# Patient Record
Sex: Female | Born: 1935 | Race: White | Hispanic: No | Marital: Married | Smoking: Never smoker
Health system: Southern US, Community
[De-identification: ages and names within clinical notes are randomized; demographics above are authoritative.]

## PROBLEM LIST (undated history)

## (undated) DIAGNOSIS — F32A Depression, unspecified: Secondary | ICD-10-CM

## (undated) DIAGNOSIS — J449 Chronic obstructive pulmonary disease, unspecified: Secondary | ICD-10-CM

## (undated) DIAGNOSIS — I10 Essential (primary) hypertension: Secondary | ICD-10-CM

## (undated) DIAGNOSIS — G629 Polyneuropathy, unspecified: Secondary | ICD-10-CM

## (undated) DIAGNOSIS — M1990 Unspecified osteoarthritis, unspecified site: Secondary | ICD-10-CM

## (undated) DIAGNOSIS — F0390 Unspecified dementia without behavioral disturbance: Secondary | ICD-10-CM

## (undated) DIAGNOSIS — I480 Paroxysmal atrial fibrillation: Secondary | ICD-10-CM

## (undated) DIAGNOSIS — E785 Hyperlipidemia, unspecified: Secondary | ICD-10-CM

## (undated) DIAGNOSIS — N390 Urinary tract infection, site not specified: Secondary | ICD-10-CM

## (undated) DIAGNOSIS — Z9289 Personal history of other medical treatment: Secondary | ICD-10-CM

## (undated) DIAGNOSIS — E039 Hypothyroidism, unspecified: Secondary | ICD-10-CM

## (undated) DIAGNOSIS — R55 Syncope and collapse: Secondary | ICD-10-CM

## (undated) DIAGNOSIS — M25569 Pain in unspecified knee: Secondary | ICD-10-CM

## (undated) DIAGNOSIS — I4891 Unspecified atrial fibrillation: Secondary | ICD-10-CM

## (undated) DIAGNOSIS — M797 Fibromyalgia: Secondary | ICD-10-CM

## (undated) DIAGNOSIS — F329 Major depressive disorder, single episode, unspecified: Secondary | ICD-10-CM

## (undated) HISTORY — PX: JOINT REPLACEMENT: SHX530

## (undated) HISTORY — DX: Depression, unspecified: F32.A

## (undated) HISTORY — DX: Personal history of other medical treatment: Z92.89

## (undated) HISTORY — DX: Unspecified atrial fibrillation: I48.91

## (undated) HISTORY — DX: Fibromyalgia: M79.7

## (undated) HISTORY — DX: Major depressive disorder, single episode, unspecified: F32.9

## (undated) HISTORY — DX: Essential (primary) hypertension: I10

## (undated) HISTORY — DX: Hypothyroidism, unspecified: E03.9

## (undated) HISTORY — PX: ABDOMINAL HYSTERECTOMY: SHX81

## (undated) HISTORY — PX: COLONOSCOPY: SHX174

## (undated) HISTORY — DX: Polyneuropathy, unspecified: G62.9

## (undated) HISTORY — DX: Hyperlipidemia, unspecified: E78.5

## (undated) HISTORY — DX: Pain in unspecified knee: M25.569

---

## 1999-02-19 ENCOUNTER — Encounter (INDEPENDENT_AMBULATORY_CARE_PROVIDER_SITE_OTHER): Payer: Self-pay

## 1999-02-19 ENCOUNTER — Ambulatory Visit (HOSPITAL_COMMUNITY): Admission: RE | Admit: 1999-02-19 | Discharge: 1999-02-19 | Payer: Self-pay | Admitting: Gastroenterology

## 1999-02-26 ENCOUNTER — Encounter: Admission: RE | Admit: 1999-02-26 | Discharge: 1999-02-26 | Payer: Self-pay | Admitting: Family Medicine

## 1999-02-26 ENCOUNTER — Encounter: Payer: Self-pay | Admitting: Family Medicine

## 1999-10-25 ENCOUNTER — Encounter: Payer: Self-pay | Admitting: *Deleted

## 1999-10-26 ENCOUNTER — Inpatient Hospital Stay (HOSPITAL_COMMUNITY): Admission: EM | Admit: 1999-10-26 | Discharge: 1999-10-27 | Payer: Self-pay | Admitting: *Deleted

## 2000-02-27 ENCOUNTER — Encounter: Payer: Self-pay | Admitting: Family Medicine

## 2000-02-27 ENCOUNTER — Encounter: Admission: RE | Admit: 2000-02-27 | Discharge: 2000-02-27 | Payer: Self-pay | Admitting: Family Medicine

## 2001-03-01 ENCOUNTER — Encounter: Admission: RE | Admit: 2001-03-01 | Discharge: 2001-03-01 | Payer: Self-pay | Admitting: Family Medicine

## 2001-03-01 ENCOUNTER — Encounter: Payer: Self-pay | Admitting: Family Medicine

## 2002-02-09 ENCOUNTER — Encounter: Admission: RE | Admit: 2002-02-09 | Discharge: 2002-02-09 | Payer: Self-pay | Admitting: Family Medicine

## 2002-02-09 ENCOUNTER — Encounter: Payer: Self-pay | Admitting: Family Medicine

## 2003-02-08 ENCOUNTER — Encounter: Admission: RE | Admit: 2003-02-08 | Discharge: 2003-02-08 | Payer: Self-pay

## 2003-02-14 ENCOUNTER — Encounter: Admission: RE | Admit: 2003-02-14 | Discharge: 2003-02-14 | Payer: Self-pay | Admitting: Family Medicine

## 2004-03-12 ENCOUNTER — Encounter: Admission: RE | Admit: 2004-03-12 | Discharge: 2004-03-12 | Payer: Self-pay | Admitting: Family Medicine

## 2004-03-19 ENCOUNTER — Encounter: Admission: RE | Admit: 2004-03-19 | Discharge: 2004-03-19 | Payer: Self-pay | Admitting: Family Medicine

## 2005-03-14 ENCOUNTER — Encounter: Admission: RE | Admit: 2005-03-14 | Discharge: 2005-03-14 | Payer: Self-pay | Admitting: Family Medicine

## 2006-03-16 ENCOUNTER — Encounter: Admission: RE | Admit: 2006-03-16 | Discharge: 2006-03-16 | Payer: Self-pay | Admitting: Family Medicine

## 2007-03-24 ENCOUNTER — Encounter: Admission: RE | Admit: 2007-03-24 | Discharge: 2007-03-24 | Payer: Self-pay | Admitting: Family Medicine

## 2007-09-14 ENCOUNTER — Encounter: Payer: Self-pay | Admitting: Cardiovascular Disease

## 2007-09-14 HISTORY — PX: US ECHOCARDIOGRAPHY: HXRAD669

## 2008-02-29 ENCOUNTER — Encounter: Admission: RE | Admit: 2008-02-29 | Discharge: 2008-02-29 | Payer: Self-pay | Admitting: Family Medicine

## 2008-03-29 ENCOUNTER — Encounter: Admission: RE | Admit: 2008-03-29 | Discharge: 2008-03-29 | Payer: Self-pay | Admitting: Family Medicine

## 2009-03-12 ENCOUNTER — Inpatient Hospital Stay (HOSPITAL_COMMUNITY): Admission: RE | Admit: 2009-03-12 | Discharge: 2009-03-14 | Payer: Self-pay | Admitting: Orthopedic Surgery

## 2009-06-27 ENCOUNTER — Encounter: Admission: RE | Admit: 2009-06-27 | Discharge: 2009-06-27 | Payer: Self-pay | Admitting: Family Medicine

## 2009-08-29 ENCOUNTER — Encounter: Payer: Self-pay | Admitting: Cardiovascular Disease

## 2009-08-29 HISTORY — PX: CARDIOVASCULAR STRESS TEST: SHX262

## 2009-11-27 ENCOUNTER — Ambulatory Visit: Payer: Self-pay | Admitting: Cardiovascular Disease

## 2010-05-16 ENCOUNTER — Ambulatory Visit (INDEPENDENT_AMBULATORY_CARE_PROVIDER_SITE_OTHER): Payer: Medicare Other | Admitting: Cardiovascular Disease

## 2010-05-16 DIAGNOSIS — E7800 Pure hypercholesterolemia, unspecified: Secondary | ICD-10-CM

## 2010-05-16 DIAGNOSIS — I119 Hypertensive heart disease without heart failure: Secondary | ICD-10-CM

## 2010-05-19 LAB — BASIC METABOLIC PANEL
BUN: 18 mg/dL (ref 6–23)
CO2: 30 mEq/L (ref 19–32)
CO2: 31 mEq/L (ref 19–32)
Calcium: 8.2 mg/dL — ABNORMAL LOW (ref 8.4–10.5)
Calcium: 8.6 mg/dL (ref 8.4–10.5)
Chloride: 95 mEq/L — ABNORMAL LOW (ref 96–112)
Chloride: 98 mEq/L (ref 96–112)
Creatinine, Ser: 0.79 mg/dL (ref 0.4–1.2)
Creatinine, Ser: 0.92 mg/dL (ref 0.4–1.2)
GFR calc Af Amer: >60 mL/min (ref >60)
GFR calc non Af Amer: 60 mL/min — ABNORMAL LOW (ref >60)
Glucose, Bld: 107 mg/dL — ABNORMAL HIGH (ref 70–99)
Glucose, Bld: 126 mg/dL — ABNORMAL HIGH (ref 70–99)
Potassium: 3.4 mEq/L — ABNORMAL LOW (ref 3.5–5.1)
Sodium: 135 mEq/L (ref 135–145)

## 2010-05-19 LAB — DIFFERENTIAL
Basophils Absolute: 0 10*3/uL (ref 0.0–0.1)
Basophils Relative: 1 % (ref 0–1)
Eosinophils Absolute: 0.1 10*3/uL (ref 0.0–0.7)
Eosinophils Relative: 2 % (ref 0–5)
Lymphocytes Relative: 38 % (ref 12–46)
Lymphs Abs: 2.4 10*3/uL (ref 0.7–4.0)
Monocytes Absolute: 0.4 10*3/uL (ref 0.1–1.0)
Monocytes Relative: 6 % (ref 3–12)
Neutro Abs: 3.2 10*3/uL (ref 1.7–7.7)
Neutrophils Relative %: 53 % (ref 43–77)

## 2010-05-19 LAB — URINE MICROSCOPIC-ADD ON

## 2010-05-19 LAB — URINE CULTURE
Colony Count: NO GROWTH
Culture: NO GROWTH

## 2010-05-19 LAB — CBC
HCT: 31.5 % — ABNORMAL LOW (ref 36.0–46.0)
HCT: 40.8 % (ref 36.0–46.0)
Hemoglobin: 11.1 g/dL — ABNORMAL LOW (ref 12.0–15.0)
Hemoglobin: 14.5 g/dL (ref 12.0–15.0)
Hemoglobin: 9.9 g/dL — ABNORMAL LOW (ref 12.0–15.0)
MCHC: 35.2 g/dL (ref 30.0–36.0)
MCHC: 35.3 g/dL (ref 30.0–36.0)
MCHC: 35.6 g/dL (ref 30.0–36.0)
MCV: 98.9 fL (ref 78.0–100.0)
MCV: 99.2 fL (ref 78.0–100.0)
MCV: 99.5 fL (ref 78.0–100.0)
Platelets: 185 10*3/uL (ref 150–400)
Platelets: 269 10*3/uL (ref 150–400)
RBC: 3.17 MIL/uL — ABNORMAL LOW (ref 3.87–5.11)
RBC: 4.11 MIL/uL (ref 3.87–5.11)
RDW: 11.6 % (ref 11.5–15.5)
RDW: 11.7 % (ref 11.5–15.5)
RDW: 12.1 % (ref 11.5–15.5)
WBC: 6.1 10*3/uL (ref 4.0–10.5)
WBC: 7.7 10*3/uL (ref 4.0–10.5)

## 2010-05-19 LAB — COMPREHENSIVE METABOLIC PANEL
ALT: 20 U/L (ref 0–35)
AST: 27 U/L (ref 0–37)
Albumin: 4.2 g/dL (ref 3.5–5.2)
Alkaline Phosphatase: 69 U/L (ref 39–117)
BUN: 19 mg/dL (ref 6–23)
CO2: 31 mEq/L (ref 19–32)
Calcium: 9.7 mg/dL (ref 8.4–10.5)
Chloride: 99 mEq/L (ref 96–112)
Creatinine, Ser: 1.17 mg/dL (ref 0.4–1.2)
GFR calc Af Amer: 55 mL/min — ABNORMAL LOW (ref >60)
GFR calc non Af Amer: 45 mL/min — ABNORMAL LOW (ref >60)
Glucose, Bld: 111 mg/dL — ABNORMAL HIGH (ref 70–99)
Potassium: 4.2 mEq/L (ref 3.5–5.1)
Sodium: 137 mEq/L (ref 135–145)
Total Bilirubin: 0.6 mg/dL (ref 0.3–1.2)
Total Protein: 7.5 g/dL (ref 6.0–8.3)

## 2010-05-19 LAB — URINALYSIS, ROUTINE W REFLEX MICROSCOPIC
Bilirubin Urine: NEGATIVE
Glucose, UA: NEGATIVE mg/dL
Hgb urine dipstick: NEGATIVE
Ketones, ur: NEGATIVE mg/dL
Nitrite: NEGATIVE
Protein, ur: NEGATIVE mg/dL
Specific Gravity, Urine: 1.024 (ref 1.005–1.030)
Urobilinogen, UA: 0.2 mg/dL (ref 0.0–1.0)
pH: 6.5 (ref 5.0–8.0)

## 2010-05-19 LAB — TYPE AND SCREEN
ABO/RH(D): O POS
Antibody Screen: NEGATIVE

## 2010-05-19 LAB — PROTIME-INR
INR: 1.02 (ref 0.00–1.49)
INR: 2.58 — ABNORMAL HIGH (ref 0.00–1.49)
Prothrombin Time: 13.3 seconds (ref 11.6–15.2)
Prothrombin Time: 27.5 seconds — ABNORMAL HIGH (ref 11.6–15.2)

## 2010-05-19 LAB — ABO/RH: ABO/RH(D): O POS

## 2010-05-19 LAB — APTT: aPTT: 28 seconds (ref 24–37)

## 2010-06-05 ENCOUNTER — Other Ambulatory Visit: Payer: Self-pay | Admitting: Family Medicine

## 2010-06-05 DIAGNOSIS — Z1231 Encounter for screening mammogram for malignant neoplasm of breast: Secondary | ICD-10-CM

## 2010-06-05 DIAGNOSIS — Z780 Asymptomatic menopausal state: Secondary | ICD-10-CM

## 2010-07-03 ENCOUNTER — Ambulatory Visit
Admission: RE | Admit: 2010-07-03 | Discharge: 2010-07-03 | Disposition: A | Discharge status: Home / Self Care | Payer: Medicare Other | Source: Ambulatory Visit | Attending: Family Medicine | Admitting: Family Medicine

## 2010-07-03 DIAGNOSIS — Z780 Asymptomatic menopausal state: Secondary | ICD-10-CM

## 2010-07-03 DIAGNOSIS — Z1231 Encounter for screening mammogram for malignant neoplasm of breast: Secondary | ICD-10-CM

## 2010-07-19 NOTE — Cardiovascular Report (Signed)
[place]. [facility]  Patient:    ALLEYJaala, Bohle                      MRN: [id number] Proc. Date: 10/26/99 Adm. Date:  [id number] Disc. Date: [id number] Attending:  Koren Bound CC:         Tammy R. Collins Scotland, M.D.   Cardiac Catheterization  HISTORY:  Ms. Cara is a 77 year old female with a history of hypertension, fibromyalgia.  She presents with 24 hours of intermittent chest pain and was found to have markedly elevated troponin levels.  She also had ST segment depression in the anterior and lateral leads.  She had continued chest pain this morning and was referred for heart catheterization for further evaluation.  PROCEDURE:  Left heart catheterization with coronary angiography.  DESCRIPTION OF THE PROCEDURE:  The right femoral artery was easily cannulated using a modified Seldinger technique.  The vessel was originally cannulated using a #7 [place] sheath.  She had some oozing around the sheath until it was later traded out for a #8 [place] sheath.  This cleared up a lot of the oozing.  HEMODYNAMICS:  The LV pressure was 140/14, with an aortic pressure of 140/71.  ANGIOGRAPHY:  The left main coronary artery is smooth and normal.  The left anterior descending artery is quite large.  The LAD is smooth throughout its course.  There are several large diagonal branches, both of which are smooth and normal.  The circumflex artery is a relative large vessel.  The obtuse marginal artery in the posterior lateral segment artery are normal.  The right coronary artery is large and normal and dominant.  The posterior descending artery in the posterolateral segment arteries are normal.  There are several small RV marginal branches which are normal.  The left ventriculogram was performed in a 30 RAO position.  It reveals overall normal left ventricular systolic function with an ejection fraction of around 65%.  There is no mitral regurgitation.  The  aortic root is not enlarged.  There is no obvious aortic insufficiency on the LV gram.  COMPLICATIONS:  None.  CONCLUSIONS: 1. Smooth and normal coronary arteries. 2. Normal left ventricular systolic function. DD:  10/26/99 TD:  10/28/99 Job: [id number]

## 2010-07-19 NOTE — Discharge Summary (Signed)
[place]. [facility]  Patient:    Angela Blackburn, Angela Blackburn                      MRN: [id number] Adm. Date:  [id number] Disc. Date: [id number] Attending:  Koren Bound CC:         Tammy R. Collins Scotland, M.D.   Discharge Summary  DISCHARGE DIAGNOSES: 1. Non-cardiac chest pain. 2. Hypothyroidism. 3. Fibromyalgia. 4. Hypertension.  DISCHARGE MEDICATIONS: 1. Nortriptyline 10 mg q.h.s. 2. Synthroid 0.125 mg a day. 3. Oxybutynin 5 mg twice a day. 4. Premarin 0.625 mg a day. 5. Keflex 500 mg at night. 6. Zestoretic 20 mg/2.5 mg a day. 7. Iron tablets as directed previously. 8. Prilosec 20 mg a day.  ACTIVITY:  The patient is to do light activity today and tomorrow.  She is to resume her activities on her regular schedule following that.  DIET:  She is to eat a low fat, low cholesterol diet.  FOLLOWUP:  She is to see Dr. Collins Scotland in several weeks.  She should have a troponin-I test in approximately 1 to 2 months.  HISTORY:  [name] is a 77 year old female who is admitted with episodes of chest pain, mild EKG changes, and markedly elevated troponin level.  Please see dictated H&P for further details.  HOSPITAL COURSE BY PROBLEMS:  QUESTION OF CORONARY ARTERY DISEASE:  The patient was admitted and had EKG changes and a troponin level consistent with subendocardial myocardial infarction.  She had mild ST segment depression in the lateral leads.  The EKG changes and her symptoms largely resolved with heparin and nitroglycerin.  The following morning she had recurrent episodes of chest pain and was taken urgently to the cath lab for further evaluation. She was found to have smooth and normal coronary arteries with no evidence of a myocardial infarction.  Her left ventricular systolic function was normal and had no segmental wall motion abnormalities.  It was determined that she had non-cardiac chest pain, and that her troponin level was spuriously elevated.  She  will be followed up for this pain by Dr. Collins Scotland.  She should have a troponin level drawn in the future to see if she has just a chronically elevated troponin level.  All of her other medical problems are stable. DD:  10/27/99 TD:  10/28/99 Job: [id number]

## 2010-07-19 NOTE — H&P (Signed)
[place]. [facility]  Patient:    Angela Blackburn, Angela Blackburn                        MRN: [id number] Attending:  Alvia Grove., M.D. CC:         Tammy R. Collins Scotland, M.D.   History and Physical  HISTORY OF PRESENT ILLNESS:  Ms. Tippins is a 77 year old female with a history of hypertension.  She is admitted to the hospital with 24 hours of intermittent chest pain, and a positive troponin level.  The patient started having intermittent episodes of chest pain last night around midnight.  The episodes were described as a pressure and burning-like sensation and were described also as a heavy weight on her chest.  These episodes lasted anywhere from 5-10 minutes, and would typically resolve spontaneously.  They eased up through the day, but tonight returned.  She states that the pain got very severe tonight, and rated it as a 10/10 pain. She received some nitroglycerin here in the emergency room and is currently relatively pain-free.  She does have somewhat of a persistent dull ache, but none of the severe sharp pain.  She is currently in no acute distress.  CURRENT MEDICATIONS: 1. Nortriptyline 10 mg q. night. 2. Synthroid 0.125 mg q.d. 3. Oxybutynin 5 mg b.i.d. 4. Premarin 0.625 mg q.d. 5. Darvocet-N 100 one tablet p.r.n. 6. Keflex 500 mg q.h.s. 7. Zestoretic 20 mg/12.5 mg q.d. 8. Iron once q.d. 9. Nitroglycerin.  ALLERGIES:  No known drug allergies.  PAST MEDICAL HISTORY: 1. History of fibromyalgia. 2. Hypertension.  SOCIAL HISTORY:  The patient is a nonsmoker and a nondrinker.  FAMILY HISTORY:  Her mother died of congestive heart failure.  REVIEW OF SYSTEMS:  Was reviewed and is essentially negative.  PHYSICAL EXAMINATION:  GENERAL:  She is an elderly white female, in no acute distress.  She is currently in no pain.  VITAL SIGNS:  Heart rate 80, blood pressure 160/90.  HEENT/NECK:  Reveals 2+ carotids, but no bruits.  No jugular venous distention,  no thyromegaly.  LUNGS:  Clear to auscultation.  BACK:  Nontender.  HEART:  A regular rate, S1, S2.  She has a 1/6 systolic ejection murmur at the left sternal border.  ABDOMEN:  Good bowel sounds, nontender.  She has no hepatosplenomegaly.  EXTREMITIES:  Good pulses.  No calf edema, no calf tenderness.  She has no clubbing or cyanosis.  NEUROLOGIC:  Nonfocal.  Cranial nerves II-XII intact.  Motor and sensory function are intact.  Gait was not assessed.  Electrocardiogram:  Reveals a normal sinus rhythm.  There is ST segment depression in the inferior lateral leads.  LABORATORY DATA:  Troponin level is 2.05.  IMPRESSION:  The patient presents with a stuttering pattern of ischemia.  She has positive troponin levels.  I suspect that she had a subendocardial myocardial infarction last night, and now has some post-infarction chest pain. Her pain is clearly better after receiving some nitroglycerin today.  Her electrocardiogram is nonacute, and I do not think that she needs to go to the cardiac catheterization right now.  PLAN:  We will admit her and place her on heparin and an Integrilin drip, as well as a  nitroglycerin drip.  We will perform a heart catheterization perhaps on Monday, or perhaps tomorrow.  We will check an electrocardiogram and serial enzymes. DD:  10/25/99 TD:  10/26/99 Job: [id number]

## 2010-09-20 ENCOUNTER — Telehealth: Payer: Self-pay | Admitting: Cardiovascular Disease

## 2010-09-20 MED ORDER — HYDROCHLOROTHIAZIDE 25 MG PO TABS: ORAL_TABLET | ORAL | Status: DC

## 2010-09-20 NOTE — Telephone Encounter (Signed)
REFILL FLUID PILL/[facility] [id number]. [facility] W[facility]. SHE IS ALMOST OUT.

## 2010-09-20 NOTE — Telephone Encounter (Signed)
Called pharmacy they were sending to tammy spear for refill.refill completed and tried to inform pt/ no answer/ no machine.Alfonso Ramus RN

## 2010-11-23 ENCOUNTER — Encounter: Payer: Self-pay | Admitting: Cardiovascular Disease

## 2010-12-11 ENCOUNTER — Encounter: Payer: Self-pay | Admitting: Cardiovascular Disease

## 2010-12-11 ENCOUNTER — Ambulatory Visit (INDEPENDENT_AMBULATORY_CARE_PROVIDER_SITE_OTHER): Payer: Medicare Other | Admitting: Cardiovascular Disease

## 2010-12-11 DIAGNOSIS — I10 Essential (primary) hypertension: Secondary | ICD-10-CM

## 2010-12-11 DIAGNOSIS — R0600 Dyspnea, unspecified: Secondary | ICD-10-CM | POA: Insufficient documentation

## 2010-12-11 DIAGNOSIS — E7800 Pure hypercholesterolemia, unspecified: Secondary | ICD-10-CM

## 2010-12-11 LAB — HEPATIC FUNCTION PANEL
ALT: 17 U/L (ref 0–35)
AST: 27 U/L (ref 0–37)
Alkaline Phosphatase: 71 U/L (ref 39–117)
Total Bilirubin: 0.8 mg/dL (ref 0.3–1.2)

## 2010-12-11 LAB — LDL CHOLESTEROL, DIRECT: Direct LDL: 152.9 mg/dL

## 2010-12-11 LAB — BASIC METABOLIC PANEL
BUN: 20 mg/dL (ref 6–23)
CO2: 31 mEq/L (ref 19–32)
Calcium: 9 mg/dL (ref 8.4–10.5)
Creatinine, Ser: 1 mg/dL (ref 0.4–1.2)

## 2010-12-11 LAB — LIPID PANEL
Total CHOL/HDL Ratio: 3
Triglycerides: 100 mg/dL (ref 0.0–149.0)

## 2010-12-11 NOTE — Assessment & Plan Note (Signed)
Her cholesterol levels have been fairly well controlled. Will check him again

## 2010-12-11 NOTE — Assessment & Plan Note (Signed)
Her blood pressure is typically well controlled. It is a little elevated today but it was normal this morning when she took it.  I've asked her to exercise on regular basis. I've asked her to watch her salt intake. We'll continue with her present medications.

## 2010-12-11 NOTE — Patient Instructions (Addendum)
Your physician wants you to follow-up in: 6 month, You will receive a reminder letter in the mail two months in advance. If you don't receive a letter, please call our office to schedule the follow-up appointment.  Your physician recommends that you return for a FASTING lipid profile: 6 months, lipids/hepatic function/bmet  Fasting labs done today, we will call you with results in 3-5 days  Watch salt intake.

## 2010-12-11 NOTE — Progress Notes (Signed)
Angela Blackburn Date of Birth  29-Aug-1932 [facility] [address]    [address] [place], [place]  [address] [phone]  Fax  [phone]  History of Present Illness:  Angela Blackburn is a 52 her old female with a history of hypertension, hypercholesterolemia and a chest pain. She's had a negative stress test in November 2009. She also has a history of hypothyroidism.  She was diagnosed as having a peripheral neuropathy recently.  She's been placed on some medication which seems to be helping some.  She's not eating quite as much exercise as she would like.  She's had a little bit of shortness breath with exertion.    She checks her blood pressure at home periodically. Her blood pressure this morning is 113/69.  She eats a little bit of salt.    Current Outpatient Prescriptions on File Prior to Visit  Medication Sig Dispense Refill  . amLODipine (NORVASC) 10 MG tablet Take 5 mg by mouth daily.       [name] aspirin 81 MG tablet Take 81 mg by mouth daily.        . cephALEXin (KEFLEX) 250 MG capsule Take 250 mg by mouth daily.        . cholecalciferol (VITAMIN D) 1000 UNITS tablet Take 3,000 Units by mouth daily.        . Cyanocobalamin (VITAMIN B12 PO) Take by mouth daily.        . fish oil-omega-3 fatty acids 1000 MG capsule Take 1 g by mouth 2 (two) times daily.        [name] GLUCOSAMINE HCL PO Take by mouth daily.        . hydrochlorothiazide 25 MG tablet Take a half tablet daily or as directed by physician.  30 tablet  6  . levothyroxine (SYNTHROID, LEVOTHROID) 125 MCG tablet Take 125 mcg by mouth daily.        . Multiple Vitamin (MULTIVITAMIN) tablet Take 1 tablet by mouth daily. Taking I-Caps      . potassium chloride (KLOR-CON) 8 MEQ CR tablet Take 8 mEq by mouth daily.          Allergies  Allergen Reactions  . Crestor (Rosuvastatin Calcium)   . Lipitor (Atorvastatin Calcium)   . Livalo (Pitavastatin Calcium)   . Zetia (Ezetimibe)     Past Medical History  Diagnosis Date  .  Hypertension   . Chest pain   . Hypothyroidism   . Hyperlipidemia   . Knee pain   . Peripheral neuropathy   . Fibromyalgia     Past Surgical History  Procedure Date  . US echocardiography 09/14/2007    EF 55-60%  . Cardiovascular stress test 08/29/2009    EF 86%    History  Smoking status  . Never Smoker   Smokeless tobacco  . Not on file    History  Alcohol Use No    Family History  Problem Relation Age of Onset  . Heart failure Mother     Reviw of Systems:  Reviewed in the HPI.  All other systems are negative.  Physical Exam: BP 142/79  Pulse 65  Ht 5\' 6"  (1.676 m)  Wt 177 lb 12.8 oz (80.65 kg)  BMI 28.70 kg/m2 The patient is alert and oriented x 3.  The mood and affect are normal.   Skin: warm and dry.  Color is normal.    HEENT:   the sclera are nonicteric.  The mucous membranes are moist.  The carotids are 2+  without bruits.  There is no thyromegaly.  There is no JVD.    Lungs: clear.  The chest wall is non tender.    Heart: regular rate with a normal S1 and S2.  There are no murmurs, gallops, or rubs. The PMI is not displaced.     Abdomen: good bowel sounds.  There is no guarding or rebound.  There is no hepatosplenomegaly or tenderness.  There are no masses.   Extremities:  no clubbing, cyanosis, or edema.  The legs are without rashes.  The distal pulses are intact.   Neuro:  Cranial nerves II - XII are intact.  Motor and sensory functions are intact.    The gait is normal.  ECG: Normal sinus rhythm. She has nonspecific ST and T-wave diagnosis. There are no significant changes from her previous EKG.  Assessment / Plan:

## 2010-12-11 NOTE — Assessment & Plan Note (Signed)
She's had some shortness of breath particularly with exertion. I think most of this is due to generalized deconditioning. She has not had any episodes of chest pain and no shortness breath at rest.  She's had a negative stress test in the past.  I've asked her to exercise on a regular basis. If she still has problems with her shortness of breath we will need to repeat her stress test and possibly an echocardiogram.

## 2010-12-12 ENCOUNTER — Telehealth: Payer: Self-pay | Admitting: *Deleted

## 2010-12-12 DIAGNOSIS — I10 Essential (primary) hypertension: Secondary | ICD-10-CM

## 2010-12-12 NOTE — Telephone Encounter (Signed)
Notified pt of low potassium. Per Dr. Patty Sermons will increase KCL to 2 tablets of 8 meq daily. Will recheck BMET in 2 wks.

## 2010-12-18 ENCOUNTER — Telehealth: Payer: Self-pay | Admitting: *Deleted

## 2010-12-18 NOTE — Telephone Encounter (Signed)
Message copied by Eugenia Pancoast on Wed Dec 18, 2010  9:03 AM ------      Message from: [place], [name]      Created: Mon Dec 16, 2010  5:26 AM       She is intolerant to statins and zetia. Continue to work on diet and exercise.

## 2010-12-18 NOTE — Telephone Encounter (Signed)
Advised patient of labs 

## 2010-12-26 ENCOUNTER — Telehealth: Payer: Self-pay | Admitting: *Deleted

## 2010-12-26 ENCOUNTER — Other Ambulatory Visit (INDEPENDENT_AMBULATORY_CARE_PROVIDER_SITE_OTHER): Payer: Medicare Other | Admitting: *Deleted

## 2010-12-26 DIAGNOSIS — E876 Hypokalemia: Secondary | ICD-10-CM

## 2010-12-26 DIAGNOSIS — I10 Essential (primary) hypertension: Secondary | ICD-10-CM

## 2010-12-26 LAB — BASIC METABOLIC PANEL
BUN: 21 mg/dL (ref 6–23)
CO2: 29 mEq/L (ref 19–32)
Calcium: 8.9 mg/dL (ref 8.4–10.5)
Chloride: 101 mEq/L (ref 96–112)
Creatinine, Ser: 1.2 mg/dL (ref 0.4–1.2)
Glucose, Bld: 106 mg/dL — ABNORMAL HIGH (ref 70–99)

## 2010-12-26 MED ORDER — POTASSIUM CHLORIDE CRYS ER 20 MEQ PO TBCR: 20.0000 meq | EXTENDED_RELEASE_TABLET | Freq: Every day | ORAL | Status: DC

## 2010-12-26 NOTE — Progress Notes (Signed)
Advised patient of potassium increase

## 2010-12-26 NOTE — Telephone Encounter (Signed)
Advised patient of change, verbalized understanding

## 2010-12-26 NOTE — Telephone Encounter (Signed)
Message copied by Burnell Blanks on Thu Dec 26, 2010  5:31 PM ------      Message from: [place], [place] J      Created: Thu Dec 26, 2010  4:31 PM       Potassium is low.  Take 3 extra KCl tabs today ( 24 meq) and then increase her KCl to 20 meq a day.            Recheck in 2 weeks

## 2010-12-27 ENCOUNTER — Telehealth: Payer: Self-pay | Admitting: Cardiovascular Disease

## 2010-12-27 NOTE — Telephone Encounter (Signed)
Pt calling back for refill, out now and needs called in today for klor-con [facility]

## 2010-12-27 NOTE — Telephone Encounter (Signed)
Pt calling wanting to speak with Juliette Alcide regarding pt needing [facility] [place]. Pt said dosage was increased and needs for [facility] to call in new RX.

## 2010-12-30 ENCOUNTER — Telehealth: Payer: Self-pay | Admitting: Cardiovascular Disease

## 2010-12-30 ENCOUNTER — Other Ambulatory Visit: Payer: Self-pay | Admitting: *Deleted

## 2010-12-30 NOTE — Telephone Encounter (Signed)
Noted  

## 2010-12-30 NOTE — Telephone Encounter (Signed)
Pt wants [facility]ill of [facility]lor[facility] [place] please call pt when done she said she called three times on Friday?

## 2010-12-30 NOTE — Telephone Encounter (Signed)
Per telephone call, pt aware KLOR-CON is refilled. Juliette Alcide called and verified with Pharmacy.Marland KitchenMarland KitchenMarland KitchenOpened in Error

## 2010-12-30 NOTE — Telephone Encounter (Signed)
I was out of the office on 10/26 when this message was taken.  Spoke with patient and apologized for no one calling her back.  She stated pharmacy did not get Rx for potassium which I phoned in.  Did call in again today and verified with pharmacy that it was received.  Patient stated she just took 3 of the 8 meq potassium pills over the weekend.  Will start 20 meg potassium when she picks it up

## 2011-01-09 ENCOUNTER — Ambulatory Visit (INDEPENDENT_AMBULATORY_CARE_PROVIDER_SITE_OTHER): Payer: Medicare Other | Admitting: *Deleted

## 2011-01-09 DIAGNOSIS — E876 Hypokalemia: Secondary | ICD-10-CM

## 2011-01-09 LAB — BASIC METABOLIC PANEL
BUN: 17 mg/dL (ref 6–23)
Calcium: 9.2 mg/dL (ref 8.4–10.5)
Creatinine, Ser: 1 mg/dL (ref 0.4–1.2)
GFR: 54.7 mL/min — ABNORMAL LOW (ref >60.00)
Glucose, Bld: 94 mg/dL (ref 70–99)

## 2011-01-10 ENCOUNTER — Other Ambulatory Visit: Payer: Self-pay | Admitting: *Deleted

## 2011-01-10 DIAGNOSIS — E876 Hypokalemia: Secondary | ICD-10-CM

## 2011-01-10 MED ORDER — POTASSIUM CHLORIDE CRYS ER 20 MEQ PO TBCR: 20.0000 meq | EXTENDED_RELEASE_TABLET | Freq: Two times a day (BID) | ORAL | Status: DC

## 2011-01-10 NOTE — Progress Notes (Signed)
Pt called with results and med list updated, new strength ordered, re draw app set up. Pt verbalized understanding. Alfonso Ramus RN

## 2011-02-12 ENCOUNTER — Other Ambulatory Visit (INDEPENDENT_AMBULATORY_CARE_PROVIDER_SITE_OTHER): Payer: Medicare Other | Admitting: *Deleted

## 2011-02-12 DIAGNOSIS — E876 Hypokalemia: Secondary | ICD-10-CM

## 2011-02-12 LAB — BASIC METABOLIC PANEL
CO2: 27 mEq/L (ref 19–32)
Calcium: 9 mg/dL (ref 8.4–10.5)
Creatinine, Ser: 1.1 mg/dL (ref 0.4–1.2)
GFR: 52.35 mL/min — ABNORMAL LOW (ref >60.00)
Sodium: 138 mEq/L (ref 135–145)

## 2011-03-05 ENCOUNTER — Ambulatory Visit
Admission: RE | Admit: 2011-03-05 | Discharge: 2011-03-05 | Disposition: A | Discharge status: Home / Self Care | Payer: Medicare Other | Source: Ambulatory Visit | Attending: Gastroenterology | Admitting: Gastroenterology

## 2011-03-05 ENCOUNTER — Other Ambulatory Visit: Payer: Self-pay | Admitting: Gastroenterology

## 2011-03-07 ENCOUNTER — Ambulatory Visit
Admission: RE | Admit: 2011-03-07 | Discharge: 2011-03-07 | Disposition: A | Discharge status: Home / Self Care | Payer: Medicare Other | Source: Ambulatory Visit | Attending: Gastroenterology | Admitting: Gastroenterology

## 2011-03-07 ENCOUNTER — Other Ambulatory Visit: Payer: Self-pay | Admitting: Gastroenterology

## 2011-06-09 ENCOUNTER — Encounter: Payer: Self-pay | Admitting: Cardiovascular Disease

## 2011-06-09 ENCOUNTER — Ambulatory Visit (INDEPENDENT_AMBULATORY_CARE_PROVIDER_SITE_OTHER): Payer: Medicare Other | Admitting: Cardiovascular Disease

## 2011-06-09 VITALS — BP 120/65 | HR 68 | Ht 65.0 in | Wt 180.6 lb

## 2011-06-09 DIAGNOSIS — I10 Essential (primary) hypertension: Secondary | ICD-10-CM

## 2011-06-09 DIAGNOSIS — E7800 Pure hypercholesterolemia, unspecified: Secondary | ICD-10-CM

## 2011-06-09 DIAGNOSIS — E785 Hyperlipidemia, unspecified: Secondary | ICD-10-CM

## 2011-06-09 LAB — LIPID PANEL
HDL: 72.6 mg/dL (ref >39.00)
Total CHOL/HDL Ratio: 3
VLDL: 17 mg/dL (ref 0.0–40.0)

## 2011-06-09 LAB — HEPATIC FUNCTION PANEL
Alkaline Phosphatase: 74 U/L (ref 39–117)
Bilirubin, Direct: 0 mg/dL (ref 0.0–0.3)
Total Bilirubin: 0.6 mg/dL (ref 0.3–1.2)
Total Protein: 7.6 g/dL (ref 6.0–8.3)

## 2011-06-09 LAB — BASIC METABOLIC PANEL
CO2: 29 mEq/L (ref 19–32)
Calcium: 9.2 mg/dL (ref 8.4–10.5)
Sodium: 139 mEq/L (ref 135–145)

## 2011-06-09 LAB — LDL CHOLESTEROL, DIRECT: Direct LDL: 124.2 mg/dL

## 2011-06-09 NOTE — Assessment & Plan Note (Addendum)
Her blood pressure seems to be fairly well controlled. We'll see her again in 6 months.

## 2011-06-09 NOTE — Assessment & Plan Note (Signed)
We'll check fasting lipids today and we'll see her again in 6 months. I've asked her to work on a good diet and exercise program.

## 2011-06-09 NOTE — Progress Notes (Signed)
Angela Blackburn Date of Birth  02-21-1933 [facility] [address]    [address] [place], [place]  [address] [phone]  Fax  [phone]  Problem list: 1. Hypertension 2. History chest pains-negative stress Myoview study in November, 2009 3. Hypothyroidism 4. Hypercholesterolemia 5. Peripheral neuropathy  History of Present Illness:  Angela Blackburn is a 81 her old female with a history of hypertension, hypercholesterolemia and a chest pain. She's had a negative stress test in November 2009. She also has a history of hypothyroidism.  She was diagnosed as having a peripheral neuropathy recently.  She's been placed on some medication which seems to be helping some.  She's not eating quite as much exercise as she would like.  She's had a little bit of shortness breath with exertion.    She checks her blood pressure at home periodically.  She eats a little bit of salt.    Current Outpatient Prescriptions on File Prior to Visit  Medication Sig Dispense Refill  . amLODipine (NORVASC) 10 MG tablet Take 10 mg by mouth daily.       [name] aspirin 81 MG tablet Take 81 mg by mouth daily.        [name] CALCIUM PO Take by mouth daily.        . cephALEXin (KEFLEX) 250 MG capsule Take 250 mg by mouth daily.        . cholecalciferol (VITAMIN D) 1000 UNITS tablet Take 3,000 Units by mouth daily.        . Cyanocobalamin (VITAMIN B12 PO) Take by mouth daily.        . fish oil-omega-3 fatty acids 1000 MG capsule Take 1 g by mouth daily.       [name] GLUCOSAMINE HCL PO Take by mouth daily.        [name] levothyroxine (SYNTHROID, LEVOTHROID) 125 MCG tablet Take 125 mcg by mouth daily.        . Multiple Vitamin (MULTIVITAMIN) tablet Take 1 tablet by mouth daily. Taking I-Caps      . nortriptyline (PAMELOR) 75 MG capsule Take 75 mg by mouth at bedtime.        . potassium chloride SA (K-DUR,KLOR-CON) 20 MEQ tablet Take 1 tablet (20 mEq total) by mouth 2 (two) times daily.  60 tablet  5  . DISCONTD: hydrochlorothiazide  25 MG tablet Take a half tablet daily or as directed by physician.  30 tablet  6    Allergies  Allergen Reactions  . Crestor (Rosuvastatin Calcium)   . Lipitor (Atorvastatin Calcium)   . Livalo (Pitavastatin Calcium)   . Zetia (Ezetimibe)     Past Medical History  Diagnosis Date  . Hypertension   . Chest pain   . Hypothyroidism   . Hyperlipidemia   . Knee pain   . Peripheral neuropathy   . Fibromyalgia     Past Surgical History  Procedure Date  . US echocardiography 09/14/2007    EF 55-60%  . Cardiovascular stress test 08/29/2009    EF 86%    History  Smoking status  . Never Smoker   Smokeless tobacco  . Not on file    History  Alcohol Use No    Family History  Problem Relation Age of Onset  . Heart failure Mother     Reviw of Systems:  Reviewed in the HPI.  All other systems are negative.  Physical Exam: BP 144/78  Pulse 68  Ht 5\' 5"  (1.651 m)  Wt 180 lb 9.6 oz (81.92  kg)  BMI 30.05 kg/m2 The patient is alert and oriented x 3.  The mood and affect are normal.   Skin: warm and dry.  Color is normal.    HEENT:   the sclera are nonicteric.  The mucous membranes are moist.  The carotids are 2+ without bruits.  There is no thyromegaly.  There is no JVD.    Lungs: clear.  The chest wall is non tender.    Heart: regular rate with a normal S1 and S2.  There are no murmurs, gallops, or rubs. The PMI is not displaced.     Abdomen: good bowel sounds.  There is no guarding or rebound.  There is no hepatosplenomegaly or tenderness.  There are no masses.   Extremities:  no clubbing, cyanosis,.  She has trace edema of the right ankle. The left ankle has no edema.  The legs are without rashes.  The distal pulses are intact.   Neuro:  Cranial nerves II - XII are intact.  Motor and sensory functions are intact.    The gait is normal.  ECG: Normal sinus rhythm. She has nonspecific ST and T-wave diagnosis. There are no significant changes from her previous  EKG.  Assessment / Plan:

## 2011-06-09 NOTE — Patient Instructions (Signed)
Your physician wants you to follow-up in: 6 months You will receive a reminder letter in the mail two months in advance. If you don't receive a letter, please call our office to schedule the follow-up appointment.  Your physician recommends that you return for a FASTING lipid profile: today and in 6 months  

## 2011-06-10 ENCOUNTER — Telehealth: Payer: Self-pay | Admitting: Cardiovascular Disease

## 2011-06-10 NOTE — Telephone Encounter (Signed)
Fu call °Pt returning your call  °

## 2011-07-01 ENCOUNTER — Other Ambulatory Visit: Payer: Self-pay | Admitting: Cardiovascular Disease

## 2011-07-01 DIAGNOSIS — E876 Hypokalemia: Secondary | ICD-10-CM

## 2011-07-01 MED ORDER — POTASSIUM CHLORIDE CRYS ER 20 MEQ PO TBCR: 20.0000 meq | EXTENDED_RELEASE_TABLET | Freq: Two times a day (BID) | ORAL | Status: DC

## 2011-07-04 ENCOUNTER — Other Ambulatory Visit: Payer: Self-pay | Admitting: Family Medicine

## 2011-07-04 DIAGNOSIS — Z1231 Encounter for screening mammogram for malignant neoplasm of breast: Secondary | ICD-10-CM

## 2011-08-11 ENCOUNTER — Ambulatory Visit
Admission: RE | Admit: 2011-08-11 | Discharge: 2011-08-11 | Disposition: A | Discharge status: Home / Self Care | Payer: Medicare Other | Source: Ambulatory Visit | Attending: Family Medicine | Admitting: Family Medicine

## 2011-08-11 DIAGNOSIS — Z1231 Encounter for screening mammogram for malignant neoplasm of breast: Secondary | ICD-10-CM

## 2011-09-26 ENCOUNTER — Telehealth: Payer: Self-pay | Admitting: Cardiovascular Disease

## 2011-09-26 DIAGNOSIS — E785 Hyperlipidemia, unspecified: Secondary | ICD-10-CM

## 2011-09-26 DIAGNOSIS — I10 Essential (primary) hypertension: Secondary | ICD-10-CM

## 2011-09-26 NOTE — Telephone Encounter (Signed)
New problem:  Patient daughter Angela Blackburn calling. C/o mother both ankles are swollen. Little sob. On feet a lot this week. Daughter is asking for her to be seen today.

## 2011-09-26 NOTE — Telephone Encounter (Signed)
Spoke with daughter, her mom has been canning this week and is c/o ankle/feet swelling with standing for long periods of time. Usual SOB. Pt told to wear support hose, elevate feet often, reduce salt intake. Pt has been taking extra hctz per daughter, i have scheduled a lab draw next week along with an app next week with lori gerhardt np. Pt agreed to plan.

## 2011-10-01 ENCOUNTER — Other Ambulatory Visit (INDEPENDENT_AMBULATORY_CARE_PROVIDER_SITE_OTHER): Payer: Medicare Other

## 2011-10-01 DIAGNOSIS — E785 Hyperlipidemia, unspecified: Secondary | ICD-10-CM

## 2011-10-01 DIAGNOSIS — I10 Essential (primary) hypertension: Secondary | ICD-10-CM

## 2011-10-01 LAB — LIPID PANEL
Cholesterol: 208 mg/dL — ABNORMAL HIGH (ref 0–200)
HDL: 72.4 mg/dL (ref >39.00)
Total CHOL/HDL Ratio: 3
Triglycerides: 86 mg/dL (ref 0.0–149.0)
VLDL: 17.2 mg/dL (ref 0.0–40.0)

## 2011-10-01 LAB — BASIC METABOLIC PANEL
Calcium: 9.2 mg/dL (ref 8.4–10.5)
Creatinine, Ser: 1 mg/dL (ref 0.4–1.2)

## 2011-10-01 LAB — HEPATIC FUNCTION PANEL
AST: 22 U/L (ref 0–37)
Total Bilirubin: 0.9 mg/dL (ref 0.3–1.2)

## 2011-10-01 LAB — LDL CHOLESTEROL, DIRECT: Direct LDL: 121.6 mg/dL

## 2011-10-06 ENCOUNTER — Encounter: Payer: Self-pay | Admitting: Nurse Practitioner

## 2011-10-06 ENCOUNTER — Ambulatory Visit (INDEPENDENT_AMBULATORY_CARE_PROVIDER_SITE_OTHER): Payer: Medicare Other | Admitting: Nurse Practitioner

## 2011-10-06 VITALS — BP 160/80 | HR 68 | Ht 66.0 in | Wt 176.0 lb

## 2011-10-06 DIAGNOSIS — R609 Edema, unspecified: Secondary | ICD-10-CM | POA: Insufficient documentation

## 2011-10-06 DIAGNOSIS — I10 Essential (primary) hypertension: Secondary | ICD-10-CM

## 2011-10-06 NOTE — Patient Instructions (Signed)
Restrict the salt  Wear support stockings  For now, stay on your current medicines  Continue to monitor your blood pressure at home  We will see you back in October as planned.  Call the [facility] office at [phone] if you have any questions, problems or concerns.

## 2011-10-06 NOTE — Assessment & Plan Note (Signed)
Patient presents with edema. This has improved with salt restriction. She has tried wearing trouser socks but is willing to get some support stockings. She is on Norvasc, but I think with salt restriction and support stockings, she will continue to improve. She will be back in touch if this does work. Patient is agreeable to this plan and will call if any problems develop in the interim.

## 2011-10-06 NOTE — Progress Notes (Signed)
Angela Blackburn Date of Birth: 03/01/1933 Medical Record #[id number]  History of Present Illness: Angela Blackburn is seen today for a work in visit. She is seen for Dr. Elease Hashimoto. She has HTN, negative Myoview in 2009, hypothyroidism, HLD and peripheral neuropathy. Has normal LV function per remote echo in 2009. She was last here in April.   She comes in today. She is here alone. Her daughter called at towards the end of July with complaints of feet/ankle swelling. Her breathing has been at her baseline. She does use salt. She took some extra HCTZ. She had been canning two weeks ago and was on her feet much more than normal. It is doing better now. She has tried to cut back on the salt but admits she is a "salt fanatic". No chest pain. Not dizzy or lightheaded. Blood pressure has been running a little lower at home lately. She is asymptomatic. She reports having a recent physical with Dr. Yehuda Budd with complete blood work.   Current Outpatient Prescriptions on File Prior to Visit  Medication Sig Dispense Refill  . albuterol (PROAIR HFA) 108 (90 BASE) MCG/ACT inhaler Inhale 2 puffs into the lungs every 6 (six) hours as needed.      [name] amLODipine (NORVASC) 10 MG tablet Take 10 mg by mouth daily.       [name] aspirin 81 MG tablet Take 81 mg by mouth daily.        . BENFOTIAMINE PO Take by mouth daily.      [name] CALCIUM PO Take by mouth daily.        . cephALEXin (KEFLEX) 250 MG capsule Take 250 mg by mouth daily.        . cholecalciferol (VITAMIN D) 1000 UNITS tablet Take 3,000 Units by mouth daily.        . fish oil-omega-3 fatty acids 1000 MG capsule Take 1 g by mouth daily.       [name] GLUCOSAMINE HCL PO Take by mouth daily.        . hydrochlorothiazide (HYDRODIURIL) 25 MG tablet Take 25 mg by mouth daily.      [name] levothyroxine (SYNTHROID, LEVOTHROID) 125 MCG tablet Take 125 mcg by mouth daily.        . Multiple Vitamin (MULTIVITAMIN) tablet Take 1 tablet by mouth daily. Taking I-Caps      . nortriptyline (PAMELOR)  75 MG capsule Take 75 mg by mouth at bedtime.        . potassium chloride SA (K-DUR,KLOR-CON) 20 MEQ tablet Take 1 tablet (20 mEq total) by mouth 2 (two) times daily.  60 tablet  11  . tiotropium (SPIRIVA) 18 MCG inhalation capsule Place 18 mcg into inhaler and inhale daily.        Allergies  Allergen Reactions  . Crestor (Rosuvastatin Calcium)   . Lipitor (Atorvastatin Calcium)   . Livalo (Pitavastatin Calcium)   . Zetia (Ezetimibe)     Past Medical History  Diagnosis Date  . Hypertension   . Chest pain   . Hypothyroidism   . Hyperlipidemia   . Knee pain   . Peripheral neuropathy   . Fibromyalgia     Past Surgical History  Procedure Date  . US echocardiography 09/14/2007    EF 55-60%  . Cardiovascular stress test 08/29/2009    EF 86%    History  Smoking status  . Never Smoker   Smokeless tobacco  . Not on file    History  Alcohol Use No  Family History  Problem Relation Age of Onset  . Heart failure Mother     Review of Systems: The review of systems is per the HPI.  All other systems were reviewed and are negative.  Physical Exam: BP 160/80  Pulse 68  Ht 5\' 6"  (1.676 m)  Wt 176 lb (79.833 kg)  BMI 28.41 kg/m2 Patient is very pleasant and in no acute distress. Skin is warm and dry. Color is normal.  HEENT is unremarkable. Normocephalic/atraumatic. PERRL. Sclera are nonicteric. Neck is supple. No masses. No JVD. Lungs are clear. Cardiac exam shows a regular rate and rhythm. Abdomen is soft. Extremities are with just trace edema. She has multiple varicosities. Gait and ROM are intact. No gross neurologic deficits noted.   LABORATORY DATA:   Lab Results  Component Value Date   WBC 7.4 03/14/2009   HGB 9.9* 03/14/2009   HCT 28.3* 03/14/2009   PLT 143* 03/14/2009   GLUCOSE 87 10/01/2011   CHOL 208* 10/01/2011   TRIG 86.0 10/01/2011   HDL 72.40 10/01/2011   LDLDIRECT 121.6 10/01/2011   ALT 17 10/01/2011   AST 22 10/01/2011   NA 137 10/01/2011   K 3.9  10/01/2011   CL 100 10/01/2011   CREATININE 1.0 10/01/2011   BUN 18 10/01/2011   CO2 29 10/01/2011   INR 2.58* 03/14/2009      Assessment / Plan:

## 2011-10-06 NOTE — Assessment & Plan Note (Signed)
Repeat blood pressure by me is down to 120/60. Her readings from home are ok and even a little on the low side. She is asymptomatic. She will continue to monitor. We will see her back at her regular appointment in October. Patient is agreeable to this plan and will call if any problems develop in the interim.

## 2011-12-09 ENCOUNTER — Ambulatory Visit (INDEPENDENT_AMBULATORY_CARE_PROVIDER_SITE_OTHER): Payer: Medicare Other | Admitting: Cardiovascular Disease

## 2011-12-09 ENCOUNTER — Encounter: Payer: Self-pay | Admitting: Cardiovascular Disease

## 2011-12-09 VITALS — BP 150/80 | HR 66 | Ht 66.0 in | Wt 171.4 lb

## 2011-12-09 DIAGNOSIS — R9431 Abnormal electrocardiogram [ECG] [EKG]: Secondary | ICD-10-CM

## 2011-12-09 DIAGNOSIS — R0789 Other chest pain: Secondary | ICD-10-CM | POA: Insufficient documentation

## 2011-12-09 NOTE — Progress Notes (Signed)
Angela Blackburn Date of Birth  11/15/1932 [facility] [address]    [address] [place], [place]  [address] [phone]  Fax  [phone]  Problem list: 1. Hypertension 2. History chest pains-negative stress Myoview study in November, 2009 3. Hypothyroidism 4. Hypercholesterolemia 5. Peripheral neuropathy  History of Present Illness:  [name] is a 50 her old female with a history of hypertension, hypercholesterolemia and a chest pain. She's had a negative stress test in November 2009. She also has a history of hypothyroidism.  She was diagnosed as having a peripheral neuropathy recently.  She's been placed on some medication which seems to be helping some.  She's not eating quite as much exercise as she would like.  She's had a little bit of shortness breath with exertion.    Oct. 8, 2013 -  She checks her blood pressure at home periodically.  Her readings are on the low side.   She has been cutting back on her salt.  She's been having some episodes of jaw pain with radiation  across her chest and into her shoulders. This typically occurs at night when she's resting. He typically does not occur with exertion. It lasts for about 5 minutes.    Current Outpatient Prescriptions on File Prior to Visit  Medication Sig Dispense Refill  . albuterol (PROAIR HFA) 108 (90 BASE) MCG/ACT inhaler Inhale 2 puffs into the lungs every 6 (six) hours as needed.      [name] amLODipine (NORVASC) 10 MG tablet Take 10 mg by mouth daily.       [name] aspirin 81 MG tablet Take 81 mg by mouth daily.        . BENFOTIAMINE PO Take by mouth daily.      [name] CALCIUM PO Take by mouth daily.        . cephALEXin (KEFLEX) 250 MG capsule Take 250 mg by mouth daily.        . cholecalciferol (VITAMIN D) 1000 UNITS tablet Take 3,000 Units by mouth daily.        . fish oil-omega-3 fatty acids 1000 MG capsule Take 1 g by mouth daily.       [name] GLUCOSAMINE HCL PO Take by mouth daily.        . hydrochlorothiazide (HYDRODIURIL)  25 MG tablet Take 25 mg by mouth daily.      [name] levothyroxine (SYNTHROID, LEVOTHROID) 125 MCG tablet Take 125 mcg by mouth daily.        . Multiple Vitamin (MULTIVITAMIN) tablet Take 1 tablet by mouth daily. Taking I-Caps      . nortriptyline (PAMELOR) 75 MG capsule Take 75 mg by mouth at bedtime.        . potassium chloride SA (K-DUR,KLOR-CON) 20 MEQ tablet Take 1 tablet (20 mEq total) by mouth 2 (two) times daily.  60 tablet  11  . tiotropium (SPIRIVA) 18 MCG inhalation capsule Place 18 mcg into inhaler and inhale daily.        Allergies  Allergen Reactions  . Crestor (Rosuvastatin Calcium)   . Lipitor (Atorvastatin Calcium)   . Livalo (Pitavastatin Calcium)   . Zetia (Ezetimibe)     Past Medical History  Diagnosis Date  . Hypertension   . Chest pain   . Hypothyroidism   . Hyperlipidemia   . Knee pain   . Peripheral neuropathy   . Fibromyalgia     Past Surgical History  Procedure Date  . US echocardiography 09/14/2007    EF 55-60%  .  Cardiovascular stress test 08/29/2009    EF 86%    History  Smoking status  . Never Smoker   Smokeless tobacco  . Not on file    History  Alcohol Use No    Family History  Problem Relation Age of Onset  . Heart failure Mother     Reviw of Systems:  Reviewed in the HPI.  All other systems are negative.  Physical Exam: BP 150/80  Pulse 66  Ht 5\' 6"  (1.676 m)  Wt 171 lb 6.4 oz (77.747 kg)  BMI 27.66 kg/m2 The patient is alert and oriented x 3.  The mood and affect are normal.   Skin: warm and dry.  Color is normal.    HEENT:   the sclera are nonicteric.  The mucous membranes are moist.  The carotids are 2+ without bruits.  There is no thyromegaly.  There is no JVD.    Lungs: clear.  The chest wall is non tender.    Heart: regular rate with a normal S1 and S2.  There are no murmurs, gallops, or rubs. The PMI is not displaced.     Abdomen: good bowel sounds.  There is no guarding or rebound.  There is no hepatosplenomegaly  or tenderness.  There are no masses.   Extremities:  no clubbing, cyanosis,.  She has trace edema of the right ankle. The left ankle has no edema.  The legs are without rashes.  The distal pulses are intact.   Neuro:  Cranial nerves II - XII are intact.  Motor and sensory functions are intact.    The gait is normal.  ECG: Oct. 8, 2013  - NSR at 66, NS ST /T abnormality Assessment / Plan:

## 2011-12-09 NOTE — Patient Instructions (Addendum)
Your physician has requested that you have a lexiscan myoview. ASAP Please follow instruction sheet, as given.   Your physician wants you to follow-up in: 6 months You will receive a reminder letter in the mail two months in advance. If you don't receive a letter, please call our office to schedule the follow-up appointment.  Your physician recommends that you return for a FASTING lipid profile: 6 months

## 2011-12-09 NOTE — Assessment & Plan Note (Signed)
Angela Blackburn presents with some mild episodes of chest tightness. These episodes of chest tightness radiating from her jaw down to her chest and through to her back. She has mild ST changes on her EKG. We'll schedule her for a [employer] study.  I'll see her in 6 months for an office visit and fasting labs. If the Myoview is abnormal and will see her earlier and arrange for her to have a cardiac catheterization.

## 2011-12-10 ENCOUNTER — Ambulatory Visit (HOSPITAL_COMMUNITY): Payer: Medicare Other | Attending: Cardiovascular Disease | Admitting: Radiology

## 2011-12-10 VITALS — BP 151/74 | Ht 66.0 in | Wt 171.0 lb

## 2011-12-10 DIAGNOSIS — J449 Chronic obstructive pulmonary disease, unspecified: Secondary | ICD-10-CM | POA: Insufficient documentation

## 2011-12-10 DIAGNOSIS — R0789 Other chest pain: Secondary | ICD-10-CM

## 2011-12-10 DIAGNOSIS — R079 Chest pain, unspecified: Secondary | ICD-10-CM

## 2011-12-10 DIAGNOSIS — R0609 Other forms of dyspnea: Secondary | ICD-10-CM | POA: Insufficient documentation

## 2011-12-10 DIAGNOSIS — I10 Essential (primary) hypertension: Secondary | ICD-10-CM | POA: Insufficient documentation

## 2011-12-10 DIAGNOSIS — R9431 Abnormal electrocardiogram [ECG] [EKG]: Secondary | ICD-10-CM

## 2011-12-10 DIAGNOSIS — J4489 Other specified chronic obstructive pulmonary disease: Secondary | ICD-10-CM | POA: Insufficient documentation

## 2011-12-10 DIAGNOSIS — R0989 Other specified symptoms and signs involving the circulatory and respiratory systems: Secondary | ICD-10-CM | POA: Insufficient documentation

## 2011-12-10 MED ORDER — TECHNETIUM TC 99M SESTAMIBI GENERIC - CARDIOLITE
30.0000 | Freq: Once | INTRAVENOUS | Status: AC | PRN
  Administered 2011-12-10: 30 via INTRAVENOUS

## 2011-12-10 MED ORDER — TECHNETIUM TC 99M SESTAMIBI GENERIC - CARDIOLITE
10.0000 | Freq: Once | INTRAVENOUS | Status: AC | PRN
  Administered 2011-12-10: 10 via INTRAVENOUS

## 2011-12-10 MED ORDER — REGADENOSON 0.4 MG/5ML IV SOLN
0.4000 mg | Freq: Once | INTRAVENOUS | Status: AC
  Administered 2011-12-10: 0.4 mg via INTRAVENOUS

## 2011-12-10 NOTE — Progress Notes (Signed)
[facility] SITE 3 NUCLEAR MED [id number] [place] [address] [phone]  Cardiology Nuclear Med Study  Angela Blackburn is a 78 y.o. female     MRN : [id number]     DOB: 1933-02-28  Procedure Date: 12/10/2011  Nuclear Med Background Indication for Stress Test:  Evaluation for Ischemia History:  COPD and 2001 Heart Cath: EF: 65% NL, ECHO: EF: 55-60% 2009: MPS: EF: 86% NL  Cardiac Risk Factors: Hypertension and Lipids  Symptoms:  Chest Pain, Chest Tightness and DOE   Nuclear Pre-Procedure Caffeine/Decaff Intake:  None > 12 hrs NPO After: 10:00pm   Lungs:  clear O2 Sat: 98% on room air. IV 0.9% NS with Angio Cath:  22g  IV Site: R Antecubital x 1, tolerated well IV Started by:  Irean Hong, RN  Chest Size (in):  36 Cup Size: C  Height: 5\' 6"  (1.676 m)  Weight:  171 lb (77.565 kg)  BMI:  Body mass index is 27.60 kg/(m^2). Tech Comments:  n/a    Nuclear Med Study 1 or 2 day study: 1 day  Stress Test Type:  Lexiscan  Reading MD: Kristeen Miss, MD  Order Authorizing Provider:  Kristeen Miss, MD  Resting Radionuclide: Technetium 1m Sestamibi  Resting Radionuclide Dose: 11.0 mCi   Stress Radionuclide:  Technetium 81m Sestamibi  Stress Radionuclide Dose: 33.0 mCi           Stress Protocol Rest HR: 72 Stress HR: 74  Rest BP: 151/74 Stress BP: 131/47  Exercise Time (min): n/a METS: n/a   Predicted Max HR: 143 bpm % Max HR: 51.75 bpm Rate Pressure Product: 62952   Dose of Adenosine (mg):  n/a Dose of Lexiscan: 0.4 mg  Dose of Atropine (mg): n/a Dose of Dobutamine: n/a mcg/kg/min (at max HR)  Stress Test Technologist: Milana Na, EMT-P  Nuclear Technologist:  Domenic Polite, CNMT     Rest Procedure:  Myocardial perfusion imaging was performed at rest 45 minutes following the intravenous administration of Technetium 33m Sestamibi. Rest ECG: NSR - Normal EKG  Stress Procedure:  The patient received IV Lexiscan 0.4 mg over  15-seconds.  Technetium 75m Sestamibi injected at 30-seconds.  There were no significant changes and chest tightness with Lexiscan.  Quantitative spect images were obtained after a 45 minute delay. Stress ECG: No significant change from baseline ECG  QPS Raw Data Images:  Normal; no motion artifact; normal heart/lung ratio. Stress Images:  Normal homogeneous uptake in all areas of the myocardium. Rest Images:  Normal homogeneous uptake in all areas of the myocardium. Subtraction (SDS):  No evidence of ischemia. Transient Ischemic Dilatation (Normal <1.22):  1.12 Lung/Heart Ratio (Normal <0.45):  0.42  Quantitative Gated Spect Images QGS EDV:  76 ml QGS ESV:  14 ml  Impression Exercise Capacity:  Lexiscan with no exercise. BP Response:  Normal blood pressure response. Clinical Symptoms:  No significant symptoms noted. ECG Impression:  No significant ST segment change suggestive of ischemia. Comparison with Prior Nuclear Study: No images to compare  Overall Impression:  Normal stress nuclear study.  No evidence of ischemia  LV Ejection Fraction: 82%.  LV Wall Motion:  NL LV Function; NL Wall Motion.    Vesta Mixer, Montez Hageman., MD, [facility] 12/10/2011, 6:34 PM Office - [phone] Pager [phone]

## 2011-12-15 ENCOUNTER — Encounter: Payer: Self-pay | Admitting: Cardiovascular Disease

## 2012-06-02 ENCOUNTER — Ambulatory Visit (INDEPENDENT_AMBULATORY_CARE_PROVIDER_SITE_OTHER): Payer: Medicare Other | Admitting: Cardiovascular Disease

## 2012-06-02 ENCOUNTER — Encounter: Payer: Self-pay | Admitting: Cardiovascular Disease

## 2012-06-02 VITALS — BP 130/78 | HR 68 | Ht 66.0 in | Wt 170.1 lb

## 2012-06-02 DIAGNOSIS — E7800 Pure hypercholesterolemia, unspecified: Secondary | ICD-10-CM

## 2012-06-02 DIAGNOSIS — R0789 Other chest pain: Secondary | ICD-10-CM

## 2012-06-02 NOTE — Assessment & Plan Note (Signed)
Her lipids were mildly elevated on her last blood draw.  She will continue to watch her diet.

## 2012-06-02 NOTE — Assessment & Plan Note (Signed)
She had a Myoview study which was normal. She continues to have some episodes of chest pain at this point I think that they're probably noncardiac. She's had 2 normal stress test in the past.

## 2012-06-02 NOTE — Progress Notes (Signed)
Angela Blackburn Date of Birth  1932/06/22 [facility] [address]    [address] [place], [place]  [address] [phone]  Fax  [phone]  Problem list: 1. Hypertension 2. History chest pains-negative stress Myoview study in November, 2009 3. Hypothyroidism 4. Hypercholesterolemia 5. Peripheral neuropathy  History of Present Illness:  Angela Blackburn is a 26 her old female with a history of hypertension, hypercholesterolemia and a chest pain. She's had a negative stress test in November 2009. She also has a history of hypothyroidism.  She was diagnosed as having a peripheral neuropathy recently.  She's been placed on some medication which seems to be helping some.  She's not eating quite as much exercise as she would like.  She's had a little bit of shortness breath with exertion.    Oct. 8, 2013 -  She checks her blood pressure at home periodically.  Her readings are on the low side.   She has been cutting back on her salt.  She's been having some episodes of jaw pain with radiation  across her chest and into her shoulders. This typically occurs at night when she's resting. He typically does not occur with exertion. It lasts for about 5 minutes.   June 02, 2012:  She has complained of some chest discomfort when I last saw her  in October. A stress Myoview study was normal. Chest normal left ventricular function and no ischemia.  She has continued to have some jaw / gum pain which then goes to her chest .  It typically occurs at night when she is in bed.  Never occurs with exercise.  She tries to exercise regularly ( water aerobics and walking)   Current Outpatient Prescriptions on File Prior to Visit  Medication Sig Dispense Refill  . albuterol (PROAIR HFA) 108 (90 BASE) MCG/ACT inhaler Inhale 2 puffs into the lungs every 6 (six) hours as needed.      [name] amLODipine (NORVASC) 10 MG tablet Take 10 mg by mouth daily.       [name] aspirin 81 MG tablet Take 81 mg by mouth daily.        [name]  CALCIUM PO Take by mouth daily.        . cephALEXin (KEFLEX) 250 MG capsule Take 250 mg by mouth daily.        . cholecalciferol (VITAMIN D) 1000 UNITS tablet Take 3,000 Units by mouth daily.        . fish oil-omega-3 fatty acids 1000 MG capsule Take 1 g by mouth daily.       [name] GLUCOSAMINE HCL PO Take by mouth daily.        . hydrochlorothiazide (HYDRODIURIL) 25 MG tablet Take 25 mg by mouth daily.      [name] levothyroxine (SYNTHROID, LEVOTHROID) 125 MCG tablet Take 125 mcg by mouth daily.        . Multiple Vitamin (MULTIVITAMIN) tablet Take 1 tablet by mouth daily. Taking I-Caps      . nortriptyline (PAMELOR) 75 MG capsule Take 75 mg by mouth at bedtime.        . potassium chloride SA (K-DUR,KLOR-CON) 20 MEQ tablet Take 1 tablet (20 mEq total) by mouth 2 (two) times daily.  60 tablet  11  . tiotropium (SPIRIVA) 18 MCG inhalation capsule Place 18 mcg into inhaler and inhale daily.       No current facility-administered medications on file prior to visit.    Allergies  Allergen Reactions  . Crestor (Rosuvastatin Calcium)   .  Lipitor (Atorvastatin Calcium)   . Livalo (Pitavastatin Calcium)   . Zetia (Ezetimibe)     Past Medical History  Diagnosis Date  . Hypertension   . Chest pain   . Hypothyroidism   . Hyperlipidemia   . Knee pain   . Peripheral neuropathy   . Fibromyalgia     Past Surgical History  Procedure Laterality Date  . US echocardiography  09/14/2007    EF 55-60%  . Cardiovascular stress test  08/29/2009    EF 86%    History  Smoking status  . Never Smoker   Smokeless tobacco  . Not on file    History  Alcohol Use No    Family History  Problem Relation Age of Onset  . Heart failure Mother     Reviw of Systems:  Reviewed in the HPI.  All other systems are negative.  Physical Exam: BP 130/78  Pulse 68  Ht 5\' 6"  (1.676 m)  Wt 170 lb 1.9 oz (77.166 kg)  BMI 27.47 kg/m2  SpO2 99% The patient is alert and oriented x 3.  The mood and affect are normal.    Skin: warm and dry.  Color is normal.    HEENT:   the sclera are nonicteric.  The mucous membranes are moist.  The carotids are 2+ without bruits.  There is no thyromegaly.  There is no JVD.    Lungs: clear.  The chest wall is non tender.    Heart: regular rate with a normal S1 and S2.  There are no murmurs, gallops, or rubs. The PMI is not displaced.     Abdomen: good bowel sounds.  There is no guarding or rebound.  There is no hepatosplenomegaly or tenderness.  There are no masses.   Extremities:  no clubbing, cyanosis,.  She has trace edema of the right ankle. The left ankle has no edema.  The legs are without rashes.  The distal pulses are intact.   Neuro:  Cranial nerves II - XII are intact.  Motor and sensory functions are intact.    The gait is normal.  ECG:  Assessment / Plan:

## 2012-06-02 NOTE — Patient Instructions (Addendum)
Your physician recommends that you continue on your current medications as directed. Please refer to the Current Medication list given to you today.  Your physician wants you to follow-up in: 6 MONTHS WITH EKG You will receive a reminder letter in the mail two months in advance. If you don't receive a letter, please call our office to schedule the follow-up appointment.  Your physician recommends that you return for a FASTING lipid profile: 6 MONTHS

## 2012-07-19 ENCOUNTER — Other Ambulatory Visit: Payer: Self-pay

## 2012-07-19 DIAGNOSIS — Z1231 Encounter for screening mammogram for malignant neoplasm of breast: Secondary | ICD-10-CM

## 2012-08-25 ENCOUNTER — Ambulatory Visit: Payer: Medicare Other

## 2012-08-25 ENCOUNTER — Ambulatory Visit
Admission: RE | Admit: 2012-08-25 | Discharge: 2012-08-25 | Disposition: A | Discharge status: Home / Self Care | Payer: Medicare Other | Source: Ambulatory Visit

## 2012-08-25 DIAGNOSIS — Z1231 Encounter for screening mammogram for malignant neoplasm of breast: Secondary | ICD-10-CM

## 2012-09-21 ENCOUNTER — Other Ambulatory Visit: Payer: Self-pay | Admitting: *Deleted

## 2012-09-21 MED ORDER — POTASSIUM CHLORIDE CRYS ER 20 MEQ PO TBCR: 20.0000 meq | EXTENDED_RELEASE_TABLET | Freq: Every day | ORAL | Status: DC

## 2012-09-21 NOTE — Telephone Encounter (Signed)
Refill sent for 30 tabs  with 1 refill

## 2012-11-03 ENCOUNTER — Encounter: Payer: Self-pay | Admitting: Cardiovascular Disease

## 2012-11-29 ENCOUNTER — Other Ambulatory Visit: Payer: Self-pay | Admitting: Cardiovascular Disease

## 2012-12-01 ENCOUNTER — Ambulatory Visit: Payer: Medicare Other | Admitting: Cardiovascular Disease

## 2012-12-01 ENCOUNTER — Other Ambulatory Visit: Payer: Medicare Other

## 2012-12-08 ENCOUNTER — Emergency Department (HOSPITAL_COMMUNITY): Payer: Medicare Other

## 2012-12-08 ENCOUNTER — Inpatient Hospital Stay (HOSPITAL_COMMUNITY)
Admission: EM | Admit: 2012-12-08 | Discharge: 2012-12-11 | DRG: 641 | Disposition: A | Discharge status: Home / Self Care | Payer: Medicare Other | Attending: Internal Medicine | Admitting: Internal Medicine

## 2012-12-08 ENCOUNTER — Inpatient Hospital Stay (HOSPITAL_COMMUNITY): Payer: Medicare Other

## 2012-12-08 ENCOUNTER — Encounter (HOSPITAL_COMMUNITY): Payer: Self-pay | Admitting: Emergency Medicine

## 2012-12-08 DIAGNOSIS — E785 Hyperlipidemia, unspecified: Secondary | ICD-10-CM | POA: Diagnosis present

## 2012-12-08 DIAGNOSIS — R609 Edema, unspecified: Secondary | ICD-10-CM | POA: Diagnosis present

## 2012-12-08 DIAGNOSIS — R42 Dizziness and giddiness: Secondary | ICD-10-CM | POA: Diagnosis present

## 2012-12-08 DIAGNOSIS — Z7982 Long term (current) use of aspirin: Secondary | ICD-10-CM

## 2012-12-08 DIAGNOSIS — R0789 Other chest pain: Secondary | ICD-10-CM | POA: Diagnosis present

## 2012-12-08 DIAGNOSIS — E871 Hypo-osmolality and hyponatremia: Principal | ICD-10-CM | POA: Diagnosis present

## 2012-12-08 DIAGNOSIS — E7800 Pure hypercholesterolemia, unspecified: Secondary | ICD-10-CM

## 2012-12-08 DIAGNOSIS — Z87440 Personal history of urinary (tract) infections: Secondary | ICD-10-CM

## 2012-12-08 DIAGNOSIS — E876 Hypokalemia: Secondary | ICD-10-CM | POA: Diagnosis present

## 2012-12-08 DIAGNOSIS — I4891 Unspecified atrial fibrillation: Secondary | ICD-10-CM | POA: Diagnosis present

## 2012-12-08 DIAGNOSIS — Z79899 Other long term (current) drug therapy: Secondary | ICD-10-CM

## 2012-12-08 DIAGNOSIS — R0600 Dyspnea, unspecified: Secondary | ICD-10-CM

## 2012-12-08 DIAGNOSIS — E039 Hypothyroidism, unspecified: Secondary | ICD-10-CM | POA: Diagnosis present

## 2012-12-08 DIAGNOSIS — IMO0001 Reserved for inherently not codable concepts without codable children: Secondary | ICD-10-CM | POA: Diagnosis present

## 2012-12-08 DIAGNOSIS — G609 Hereditary and idiopathic neuropathy, unspecified: Secondary | ICD-10-CM | POA: Diagnosis present

## 2012-12-08 DIAGNOSIS — I10 Essential (primary) hypertension: Secondary | ICD-10-CM | POA: Diagnosis present

## 2012-12-08 LAB — CBC
MCHC: 36.2 g/dL — ABNORMAL HIGH (ref 30.0–36.0)
Platelets: 274 10*3/uL (ref 150–400)
RDW: 11.8 % (ref 11.5–15.5)
WBC: 8.2 10*3/uL (ref 4.0–10.5)

## 2012-12-08 LAB — POCT I-STAT TROPONIN I: Troponin i, poc: 0 ng/mL (ref 0.00–0.08)

## 2012-12-08 LAB — BASIC METABOLIC PANEL
Chloride: 78 mEq/L — ABNORMAL LOW (ref 96–112)
Creatinine, Ser: 0.77 mg/dL (ref 0.50–1.10)
GFR calc Af Amer: >90 mL/min (ref >90)
GFR calc non Af Amer: 78 mL/min — ABNORMAL LOW (ref >90)
Potassium: 2.9 mEq/L — ABNORMAL LOW (ref 3.5–5.1)

## 2012-12-08 MED ORDER — DIAZEPAM 5 MG PO TABS
5.0000 mg | ORAL_TABLET | Freq: Once | ORAL | Status: AC
  Administered 2012-12-08: 5 mg via ORAL
  Filled 2012-12-08: qty 1

## 2012-12-08 MED ORDER — ONDANSETRON HCL 4 MG/2ML IJ SOLN
4.0000 mg | Freq: Once | INTRAMUSCULAR | Status: AC
  Administered 2012-12-08: 4 mg via INTRAVENOUS
  Filled 2012-12-08: qty 2

## 2012-12-08 MED ORDER — DIAZEPAM 5 MG/ML IJ SOLN
5.0000 mg | Freq: Once | INTRAMUSCULAR | Status: DC
  Filled 2012-12-08: qty 2

## 2012-12-08 MED ORDER — SODIUM CHLORIDE 0.9 % IV BOLUS (SEPSIS)
1000.0000 mL | Freq: Once | INTRAVENOUS | Status: AC
  Administered 2012-12-08: 1000 mL via INTRAVENOUS

## 2012-12-08 MED ORDER — ASPIRIN 325 MG PO TABS
325.0000 mg | ORAL_TABLET | ORAL | Status: AC
  Administered 2012-12-08: 325 mg via ORAL
  Filled 2012-12-08: qty 1

## 2012-12-08 MED ORDER — POTASSIUM CHLORIDE 10 MEQ/100ML IV SOLN
10.0000 meq | Freq: Once | INTRAVENOUS | Status: AC
  Administered 2012-12-08: 10 meq via INTRAVENOUS
  Filled 2012-12-08: qty 100

## 2012-12-08 MED ORDER — POTASSIUM CHLORIDE CRYS ER 20 MEQ PO TBCR
40.0000 meq | EXTENDED_RELEASE_TABLET | Freq: Once | ORAL | Status: AC
  Administered 2012-12-08: 40 meq via ORAL
  Filled 2012-12-08: qty 2

## 2012-12-08 MED ORDER — ONDANSETRON HCL 4 MG/2ML IJ SOLN: 4.0000 mg | Freq: Three times a day (TID) | INTRAMUSCULAR | Status: DC | PRN

## 2012-12-08 NOTE — ED Provider Notes (Signed)
CSN: [id number]     Arrival date & time 12/08/12  1938 History   First MD Initiated Contact with Patient 12/08/12 2005     Chief Complaint  Patient presents with  . Chest Pain   (Consider location/radiation/quality/duration/timing/severity/associated sxs/prior Treatment) HPI Comments: 79 y/o female with history of HTN, HLD presenting with dizziness and chest discomfort. She reports a sensation of the room spinning that began after visiting her PCP for UTI treatment today. She had a Lexiscan today that showed no ischemia. She reports that the dizziness is worse with standing or rapid head movements. She describes the chest discomfort as an anxious feeling but denies pain or pressure. She also reports mild SOB with exertion. No recent infectious symptoms. Also notes tingling in her fingertips.  Denies facial droop, numbness, weakness, nausea/vomiting, difficulty swallowing/speaking.   The history is provided by the patient. No language interpreter was used.    Past Medical History  Diagnosis Date  . Hypertension   . Chest pain   . Hypothyroidism   . Hyperlipidemia   . Knee pain   . Peripheral neuropathy   . Fibromyalgia    Past Surgical History  Procedure Laterality Date  . US echocardiography  09/14/2007    EF 55-60%  . Cardiovascular stress test  08/29/2009    EF 86%   Family History  Problem Relation Age of Onset  . Heart failure Mother    History  Substance Use Topics  . Smoking status: Never Smoker   . Smokeless tobacco: Not on file  . Alcohol Use: No   OB History   Grav Para Term Preterm Abortions TAB SAB Ect Mult Living                 Review of Systems  Constitutional: Negative for fever and activity change.  Respiratory: Positive for shortness of breath. Negative for cough and chest tightness.   Cardiovascular: Positive for leg swelling. Negative for chest pain and palpitations.  Neurological: Positive for dizziness. Negative for tremors, seizures, facial  asymmetry, speech difficulty, weakness, numbness and headaches.  All other systems reviewed and are negative.    Allergies  Crestor; Lipitor; Livalo; Other; and Zetia  Home Medications   Current Outpatient Rx  Name  Route  Sig  Dispense  Refill  . acetaminophen (TYLENOL) 500 MG tablet   Oral   Take 1,000 mg by mouth every 6 (six) hours as needed for pain.         [name] albuterol (PROAIR HFA) 108 (90 BASE) MCG/ACT inhaler   Inhalation   Inhale 2 puffs into the lungs every 6 (six) hours as needed for shortness of breath.          [name] amLODipine (NORVASC) 10 MG tablet   Oral   Take 10 mg by mouth daily.          [name] aspirin 81 MG tablet   Oral   Take 81 mg by mouth daily.           . calcium carbonate (OS-CAL) 600 MG TABS tablet   Oral   Take 600 mg by mouth daily with breakfast.         . Cholecalciferol (VITAMIN D) 2000 UNITS tablet   Oral   Take 2,000 Units by mouth daily.         [name] docusate sodium (COLACE) 100 MG capsule   Oral   Take 100 mg by mouth 2 (two) times daily.         [name]  fish oil-omega-3 fatty acids 1000 MG capsule   Oral   Take 1 g by mouth daily.         . Glucosamine-Chondroit-Vit C-Mn (GLUCOSAMINE CHONDR 1500 COMPLX) CAPS   Oral   Take 1 capsule by mouth daily.         . hydrochlorothiazide (HYDRODIURIL) 25 MG tablet   Oral   Take 25 mg by mouth daily.         [name] levothyroxine (SYNTHROID, LEVOTHROID) 125 MCG tablet   Oral   Take 125 mcg by mouth daily.           . methylcellulose (ARTIFICIAL TEARS) 1 % ophthalmic solution   Both Eyes   Place 1 drop into both eyes as needed (for dry eyes).         . Multiple Vitamins-Minerals (ICAPS) CAPS   Oral   Take 1 capsule by mouth daily.         . nitrofurantoin, macrocrystal-monohydrate, (MACROBID) 100 MG capsule   Oral   Take 100 mg by mouth 2 (two) times daily.         . nortriptyline (PAMELOR) 75 MG capsule   Oral   Take 75 mg by mouth at bedtime.           .  potassium chloride (KLOR-CON) 20 MEQ packet   Oral   Take 20 mEq by mouth daily.         . psyllium (METAMUCIL) 58.6 % packet   Oral   Take 1 packet by mouth daily.         [name] tiotropium (SPIRIVA) 18 MCG inhalation capsule   Inhalation   Place 18 mcg into inhaler and inhale daily.          BP 153/73  Pulse 86  Temp(Src) 97.8 F (36.6 C) (Oral)  Resp 18  Wt 170 lb 2 oz (77.168 kg)  BMI 27.47 kg/m2  SpO2 98% Physical Exam  Vitals reviewed. Constitutional: She is oriented to person, place, and time. She appears well-developed and well-nourished. No distress.  HENT:  Head: Normocephalic.  Mouth/Throat: Uvula is midline.  Eyes: EOM are normal. Pupils are equal, round, and reactive to light.  Neck: Neck supple.  Cardiovascular: Normal rate, regular rhythm, normal heart sounds and intact distal pulses.   Pulmonary/Chest: Effort normal and breath sounds normal.  Abdominal: Soft. Bowel sounds are normal. She exhibits no distension. There is no tenderness.  Musculoskeletal: She exhibits edema (1+ bilaterally).  Neurological: She is alert and oriented to person, place, and time. She has normal strength and normal reflexes. No cranial nerve deficit or sensory deficit. She displays a negative Romberg sign. Coordination and gait normal. GCS eye subscore is 4. GCS verbal subscore is 5. GCS motor subscore is 6.  CN II-XII intact. No visual field deficit. Normal FTN and heel shin. No pronator drift.   Vertigo symptoms reproduced bilaterally with Dix hallpike maneuvers but no nystagmus.    ED Course  Procedures (including critical care time) Labs Review Labs Reviewed  CBC - Abnormal; Notable for the following:    HCT 34.3 (*)    MCHC 36.2 (*)    All other components within normal limits  BASIC METABOLIC PANEL  PRO B NATRIURETIC PEPTIDE  POCT I-STAT TROPONIN I   Imaging Review Dg Chest 2 View  12/08/2012   *RADIOLOGY REPORT*  Clinical Data: Chest pain and dizziness.  CHEST - 2  VIEW  Comparison: None.  Findings: The lungs are well-aerated.  Mild vascular  congestion is noted.  There is no evidence of focal opacification, pleural effusion or pneumothorax.  The heart is borderline normal in size; the mediastinal contour is within normal limits.  No acute osseous abnormalities are seen.  IMPRESSION: Mild vascular congestion noted; no acute cardiopulmonary process seen.   Original Report Authenticated By: Tonia Ghent, M.D.   Ct Head Wo Contrast  12/08/2012   *RADIOLOGY REPORT*  Clinical Data: Nausea, dizziness and shortness of breath; weakness and fatigue.  CT HEAD WITHOUT CONTRAST  Technique:  Contiguous axial images were obtained from the base of the skull through the vertex without contrast.  Comparison: None.  Findings: There is no evidence of acute infarction, mass lesion, or intra- or extra-axial hemorrhage on CT.  Prominence of the sulci suggests mild cortical volume loss. Diffuse periventricular and subcortical white matter change likely reflects small vessel ischemic microangiopathy.  The brainstem and fourth ventricle are within normal limits.  The basal ganglia are unremarkable in appearance.  The cerebral hemispheres demonstrate grossly normal gray-white differentiation. No mass effect or midline shift is seen.  There is no evidence of fracture; visualized osseous structures are unremarkable in appearance.  The visualized portions of the orbits are within normal limits.  The paranasal sinuses and mastoid air cells are well-aerated.  A 1.2 cm subcutaneous nodule at the vertex is likely benign in nature.  IMPRESSION:  1.  No acute intracranial pathology seen on CT. 2.  Mild cortical volume loss and diffuse small vessel ischemic microangiopathy. 3.  1.2 cm subcutaneous nodule at the vertex is likely benign in nature.   Original Report Authenticated By: Tonia Ghent, M.D.    Date: 12/09/2012  Rate: 83  Rhythm: normal sinus rhythm  QRS Axis: normal  Intervals: normal  ST/T  Wave abnormalities: normal  Conduction Disutrbances:none  Narrative Interpretation: sinus rhythm, baseline artifact, no ischemic changes  Old EKG Reviewed: unchanged   MDM  No diagnosis found.  79 y/o female with history of HTN, hypothyroidism, chest pain with Lexiscan showing no ischemia yesterday presenting with dizziness that began at PCP office. Currently on treatment for UTI x 4 weeks (cipro, now switched to macrobid). Dizziness worse with head movement and she reports minimal symptoms when head still. AFVSS. Non focal neurological exam. Symptoms worse with Dix-Hallpike but no nystagmus. Suspect peripheral cause of vertigo. Valium given as well as fluid bolus. Labs remarkable for hyponatremia 117 and hypokalemia. Presentation and history inconsistent with ACS, PE, dissection as cause of chest discomfort. Troponin negative. BNP mildly elevated. Hospitalist consulted for admission for hyponatremia. CT head prior to leaving ED with no acute intracranial abnormality.   Labs and imaging reviewed in my medical decision making if ordered. Patient discussed with my attending, Dr. Anitra Lauth.     Abagail Kitchens, MD 12/09/12 [phone]

## 2012-12-08 NOTE — ED Notes (Addendum)
Presents with chest pain described as "uneasiness and just feels weird" began today associated with nausea, dizziness and SOB, weakness and fatigue. Dizziness is worse with change of position and better with sitting.  Bilateral pedal edema noted.

## 2012-12-08 NOTE — ED Notes (Signed)
Pt reports positional dizziness

## 2012-12-09 ENCOUNTER — Encounter (HOSPITAL_COMMUNITY): Payer: Self-pay | Admitting: Internal Medicine

## 2012-12-09 DIAGNOSIS — E876 Hypokalemia: Secondary | ICD-10-CM | POA: Diagnosis present

## 2012-12-09 DIAGNOSIS — I4891 Unspecified atrial fibrillation: Secondary | ICD-10-CM | POA: Diagnosis not present

## 2012-12-09 DIAGNOSIS — E871 Hypo-osmolality and hyponatremia: Secondary | ICD-10-CM | POA: Diagnosis present

## 2012-12-09 DIAGNOSIS — R42 Dizziness and giddiness: Secondary | ICD-10-CM | POA: Diagnosis present

## 2012-12-09 DIAGNOSIS — R0609 Other forms of dyspnea: Secondary | ICD-10-CM

## 2012-12-09 DIAGNOSIS — R609 Edema, unspecified: Secondary | ICD-10-CM

## 2012-12-09 LAB — BASIC METABOLIC PANEL
BUN: 8 mg/dL (ref 6–23)
BUN: 9 mg/dL (ref 6–23)
BUN: 11 mg/dL (ref 6–23)
CO2: 25 mEq/L (ref 19–32)
CO2: 26 mEq/L (ref 19–32)
CO2: 26 mEq/L (ref 19–32)
Calcium: 8.5 mg/dL (ref 8.4–10.5)
Calcium: 8.9 mg/dL (ref 8.4–10.5)
Calcium: 9.4 mg/dL (ref 8.4–10.5)
Calcium: 9 mg/dL (ref 8.4–10.5)
Chloride: 90 mEq/L — ABNORMAL LOW (ref 96–112)
Creatinine, Ser: 0.69 mg/dL (ref 0.50–1.10)
Creatinine, Ser: 0.76 mg/dL (ref 0.50–1.10)
Creatinine, Ser: 0.86 mg/dL (ref 0.50–1.10)
Creatinine, Ser: 0.98 mg/dL (ref 0.50–1.10)
GFR calc Af Amer: >90 mL/min (ref >90)
GFR calc Af Amer: 73 mL/min — ABNORMAL LOW (ref >90)
GFR calc Af Amer: 62 mL/min — ABNORMAL LOW (ref >90)
GFR calc non Af Amer: 79 mL/min — ABNORMAL LOW (ref >90)
GFR calc non Af Amer: 63 mL/min — ABNORMAL LOW (ref >90)
GFR calc non Af Amer: 54 mL/min — ABNORMAL LOW (ref >90)
Glucose, Bld: 120 mg/dL — ABNORMAL HIGH (ref 70–99)
Glucose, Bld: 96 mg/dL (ref 70–99)
Potassium: 3.9 mEq/L (ref 3.5–5.1)
Potassium: 3.9 mEq/L (ref 3.5–5.1)
Sodium: 123 mEq/L — ABNORMAL LOW (ref 135–145)
Sodium: 125 mEq/L — ABNORMAL LOW (ref 135–145)

## 2012-12-09 LAB — TROPONIN I
Troponin I: <0.3 ng/mL (ref <0.30)
Troponin I: <0.3 ng/mL (ref <0.30)

## 2012-12-09 LAB — URINALYSIS, ROUTINE W REFLEX MICROSCOPIC
Nitrite: POSITIVE — AB
Protein, ur: NEGATIVE mg/dL
Specific Gravity, Urine: 1.011 (ref 1.005–1.030)
Urobilinogen, UA: 0.2 mg/dL (ref 0.0–1.0)
pH: 7 (ref 5.0–8.0)

## 2012-12-09 LAB — CBC
HCT: 30.9 % — ABNORMAL LOW (ref 36.0–46.0)
MCHC: 36.9 g/dL — ABNORMAL HIGH (ref 30.0–36.0)
Platelets: 236 10*3/uL (ref 150–400)
RBC: 3.57 MIL/uL — ABNORMAL LOW (ref 3.87–5.11)
RDW: 11.5 % (ref 11.5–15.5)
WBC: 5.5 10*3/uL (ref 4.0–10.5)

## 2012-12-09 LAB — OSMOLALITY, URINE: Osmolality, Ur: 450 mOsm/kg (ref 390–1090)

## 2012-12-09 LAB — URINE MICROSCOPIC-ADD ON

## 2012-12-09 MED ORDER — ACETAMINOPHEN 650 MG RE SUPP: 650.0000 mg | Freq: Four times a day (QID) | RECTAL | Status: DC | PRN

## 2012-12-09 MED ORDER — ONDANSETRON HCL 4 MG/2ML IJ SOLN: 4.0000 mg | Freq: Four times a day (QID) | INTRAMUSCULAR | Status: DC | PRN

## 2012-12-09 MED ORDER — SODIUM CHLORIDE 0.9 % IJ SOLN
3.0000 mL | Freq: Two times a day (BID) | INTRAMUSCULAR | Status: DC
  Administered 2012-12-09 (×2): 3 mL via INTRAVENOUS

## 2012-12-09 MED ORDER — POTASSIUM CHLORIDE CRYS ER 20 MEQ PO TBCR
40.0000 meq | EXTENDED_RELEASE_TABLET | Freq: Once | ORAL | Status: AC
  Administered 2012-12-09: 40 meq via ORAL
  Filled 2012-12-09: qty 2

## 2012-12-09 MED ORDER — ASPIRIN 81 MG PO CHEW
81.0000 mg | CHEWABLE_TABLET | Freq: Every day | ORAL | Status: DC
  Administered 2012-12-09 – 2012-12-10 (×2): 81 mg via ORAL
  Filled 2012-12-09 (×2): qty 1

## 2012-12-09 MED ORDER — LEVOTHYROXINE SODIUM 125 MCG PO TABS
125.0000 ug | ORAL_TABLET | Freq: Every day | ORAL | Status: DC
  Administered 2012-12-09 – 2012-12-11 (×3): 125 ug via ORAL
  Filled 2012-12-09 (×5): qty 1

## 2012-12-09 MED ORDER — HYDRALAZINE HCL 20 MG/ML IJ SOLN: 10.0000 mg | INTRAMUSCULAR | Status: DC | PRN

## 2012-12-09 MED ORDER — NORTRIPTYLINE HCL 25 MG PO CAPS
75.0000 mg | ORAL_CAPSULE | Freq: Every day | ORAL | Status: DC
  Administered 2012-12-09 – 2012-12-10 (×3): 75 mg via ORAL
  Filled 2012-12-09 (×4): qty 3

## 2012-12-09 MED ORDER — AMLODIPINE BESYLATE 10 MG PO TABS
10.0000 mg | ORAL_TABLET | Freq: Every day | ORAL | Status: DC
  Administered 2012-12-09 – 2012-12-11 (×3): 10 mg via ORAL
  Filled 2012-12-09 (×3): qty 1

## 2012-12-09 MED ORDER — OMEGA-3-ACID ETHYL ESTERS 1 G PO CAPS
1.0000 g | ORAL_CAPSULE | Freq: Every day | ORAL | Status: DC
  Administered 2012-12-09 – 2012-12-11 (×3): 1 g via ORAL
  Filled 2012-12-09 (×3): qty 1

## 2012-12-09 MED ORDER — OCUVITE-LUTEIN PO CAPS
1.0000 | ORAL_CAPSULE | Freq: Every day | ORAL | Status: DC
  Administered 2012-12-09 – 2012-12-11 (×3): 1 via ORAL
  Filled 2012-12-09 (×3): qty 1

## 2012-12-09 MED ORDER — PSYLLIUM 95 % PO PACK
1.0000 | PACK | Freq: Every day | ORAL | Status: DC
  Administered 2012-12-09 – 2012-12-11 (×3): 1 via ORAL
  Filled 2012-12-09 (×3): qty 1

## 2012-12-09 MED ORDER — CALCIUM CARBONATE 1250 (500 CA) MG PO TABS
1.0000 | ORAL_TABLET | Freq: Every day | ORAL | Status: DC
  Administered 2012-12-09 – 2012-12-11 (×3): 500 mg via ORAL
  Filled 2012-12-09 (×5): qty 1

## 2012-12-09 MED ORDER — POLYVINYL ALCOHOL 1.4 % OP SOLN
1.0000 [drp] | OPHTHALMIC | Status: DC | PRN
  Filled 2012-12-09 (×2): qty 15

## 2012-12-09 MED ORDER — DOCUSATE SODIUM 100 MG PO CAPS
100.0000 mg | ORAL_CAPSULE | Freq: Two times a day (BID) | ORAL | Status: DC
  Administered 2012-12-09 – 2012-12-11 (×5): 100 mg via ORAL
  Filled 2012-12-09 (×5): qty 1

## 2012-12-09 MED ORDER — POTASSIUM CHLORIDE 10 MEQ/100ML IV SOLN
10.0000 meq | INTRAVENOUS | Status: AC
  Administered 2012-12-09 (×2): 10 meq via INTRAVENOUS
  Filled 2012-12-09: qty 100

## 2012-12-09 MED ORDER — ONDANSETRON HCL 4 MG PO TABS: 4.0000 mg | ORAL_TABLET | Freq: Four times a day (QID) | ORAL | Status: DC | PRN

## 2012-12-09 MED ORDER — DEXTROSE 5 % IV SOLN
1.0000 g | INTRAVENOUS | Status: DC
  Administered 2012-12-09: 1 g via INTRAVENOUS
  Filled 2012-12-09 (×2): qty 10

## 2012-12-09 MED ORDER — POTASSIUM CHLORIDE 20 MEQ PO PACK
20.0000 meq | PACK | Freq: Every day | ORAL | Status: DC
  Administered 2012-12-09 – 2012-12-10 (×2): 20 meq via ORAL
  Filled 2012-12-09 (×2): qty 1

## 2012-12-09 MED ORDER — ENOXAPARIN SODIUM 40 MG/0.4ML ~~LOC~~ SOLN
40.0000 mg | SUBCUTANEOUS | Status: DC
  Administered 2012-12-09 – 2012-12-10 (×2): 40 mg via SUBCUTANEOUS
  Filled 2012-12-09 (×3): qty 0.4

## 2012-12-09 MED ORDER — ALBUTEROL SULFATE HFA 108 (90 BASE) MCG/ACT IN AERS
2.0000 | INHALATION_SPRAY | Freq: Four times a day (QID) | RESPIRATORY_TRACT | Status: DC | PRN
  Filled 2012-12-09: qty 6.7

## 2012-12-09 MED ORDER — ACETAMINOPHEN 325 MG PO TABS
650.0000 mg | ORAL_TABLET | Freq: Four times a day (QID) | ORAL | Status: DC | PRN
  Administered 2012-12-10: 650 mg via ORAL
  Filled 2012-12-09: qty 2

## 2012-12-09 MED ORDER — TIOTROPIUM BROMIDE MONOHYDRATE 18 MCG IN CAPS
18.0000 ug | ORAL_CAPSULE | Freq: Every day | RESPIRATORY_TRACT | Status: DC
  Administered 2012-12-09 – 2012-12-11 (×2): 18 ug via RESPIRATORY_TRACT
  Filled 2012-12-09 (×2): qty 5

## 2012-12-09 MED ORDER — OMEGA-3 FATTY ACIDS 1000 MG PO CAPS: 1.0000 g | ORAL_CAPSULE | Freq: Every day | ORAL | Status: DC

## 2012-12-09 MED ORDER — SODIUM CHLORIDE 0.9 % IJ SOLN: 3.0000 mL | Freq: Two times a day (BID) | INTRAMUSCULAR | Status: DC

## 2012-12-09 MED ORDER — METHYLCELLULOSE 1 % OP SOLN: 1.0000 [drp] | OPHTHALMIC | Status: DC | PRN

## 2012-12-09 MED ORDER — CALCIUM CARBONATE 600 MG PO TABS
600.0000 mg | ORAL_TABLET | Freq: Every day | ORAL | Status: DC
Start: 2012-12-09 — End: 2012-12-09
  Filled 2012-12-09: qty 1

## 2012-12-09 NOTE — Progress Notes (Signed)
Utilization review completed.  

## 2012-12-09 NOTE — Progress Notes (Signed)
[facility] Progress Note [place] TEAM 1 - Stepdown/ICU TEAM   CYSTAL SHANNAHAN ZOX:[id number] DOB: 22-Feb-1933 DOA: 12/08/2012 PCP: Herb Grays, MD  Brief narrative: Angela Blackburn is a 79 y.o. female with history of hypertension and hypothyroidism started experiencing dizziness since last afternoon. Patient was dizzy on trying to stand. Did not have any focal deficits or any headache blurred vision or difficulty speaking. Since the dizziness persisted she came to the ER. In the ER patient was found to be nonfocal on exam and labs showed severe hyponatremia with sodium 117. Patient states that she has been recently treated for urinary tract infection for last 4 weeks. Patient had ciprofloxacin at least 2 courses with recently been placed on Macrobid. Since she had urinary tract infection she has been trying to drink a lot of fluid last 2 days. Patient also noticed increasing swelling of the lower extremities last few days. Patient has been having some nausea last few days denies any vomiting abdominal pain or diarrhea. Patient otherwise denies any shortness of breath but has chronic chest tightness which has been evaluated by patient's cardiologist. Presently patient is chest pain-free. On exam patient is nonfocal. Has mild edema in the lower extremities. Chest x-ray shows congestion. CT head is negative. Patient has been admitted for further management.   Assessment/Plan: Principal Problem:   Hyponatremia - improving but urine studies reveal that this may be SIADH therefore will fluid restrict- have discussed w/ pt - holding hctz  Active Problems:   HTN (hypertension) - stable on current meds    Dizziness - improved as sodium improved  A-fib - noted on tele today - check ECHO- has no history of this- call cardio consult- pt sees [facility]    Hypokalemia - replaced and resolved    Code Status: full  Family Communication: with husband Disposition Plan: transfer to  med/surg  Consultants: none  Procedures: none  Antibiotics: none  DVT prophylaxis: Lovenox  HPI/Subjective: Pt no longer dizzy. Has no symptoms at all right now. Discussed fluid restriction and the reasoning for this.     Objective: Blood pressure 126/65, pulse 78, temperature 98 F (36.7 C), temperature source Oral, resp. rate 25, height 5\' 6"  (1.676 m), weight 85.4 kg (188 lb 4.4 oz), SpO2 98.00%.  Intake/Output Summary (Last 24 hours) at 12/09/12 1448 Last data filed at 12/09/12 1239  Gross per 24 hour  Intake    390 ml  Output   2650 ml  Net  -2260 ml     Exam: General: No acute respiratory distress Lungs: crackles in RLL- mild Cardiovascular: Irregular rate and rhythm without murmur gallop or rub normal S1 and S2 Abdomen: Nontender, nondistended, soft, bowel sounds positive, no rebound, no ascites, no appreciable mass Extremities: No significant cyanosis, clubbing- positive edema bilateral lower extremities but no pitting  Data Reviewed: Basic Metabolic Panel:  Recent Labs Lab 12/08/12 1949 12/09/12 0612 12/09/12 0818  NA 117* 123* 125*  K 2.9* 3.0* 4.3  CL 78* 85* 88*  CO2 25 25 26   GLUCOSE 134* 102* 120*  BUN 14 8 8   CREATININE 0.77 0.69 0.76  CALCIUM 9.0 8.5 8.9   Liver Function Tests: No results found for this basename: AST, ALT, ALKPHOS, BILITOT, PROT, ALBUMIN,  in the last 168 hours No results found for this basename: LIPASE, AMYLASE,  in the last 168 hours No results found for this basename: AMMONIA,  in the last 168 hours CBC:  Recent Labs Lab 12/08/12 1949 12/09/12 0612  WBC 8.2 5.5  HGB 12.4 11.4*  HCT 34.3* 30.9*  MCV 88.6 86.6  PLT 274 236   Cardiac Enzymes:  Recent Labs Lab 12/09/12 0614  TROPONINI <0.30   BNP (last 3 results)  Recent Labs  12/08/12 1949  PROBNP 872.8*   CBG: No results found for this basename: GLUCAP,  in the last 168 hours  Recent Results (from the past 240 hour(s))  MRSA PCR SCREENING      Status: None   Collection Time    12/08/12 11:54 PM      Result Value Range Status   MRSA by PCR NEGATIVE  NEGATIVE Final   Comment:            The GeneXpert MRSA Assay (FDA     approved for NASAL specimens     only), is one component of a     comprehensive MRSA colonization     surveillance program. It is not     intended to diagnose MRSA     infection nor to guide or     monitor treatment for     MRSA infections.     Studies:  Recent x-ray studies have been reviewed in detail by the Attending Physician  Scheduled Meds:  Scheduled Meds: . amLODipine  10 mg Oral Daily  . aspirin  81 mg Oral Daily  . calcium carbonate  1 tablet Oral Q breakfast  . cefTRIAXone (ROCEPHIN)  IV  1 g Intravenous Q24H  . docusate sodium  100 mg Oral BID  . enoxaparin (LOVENOX) injection  40 mg Subcutaneous Q24H  . levothyroxine  125 mcg Oral QAC breakfast  . multivitamin-lutein  1 capsule Oral Daily  . nortriptyline  75 mg Oral QHS  . omega-3 acid ethyl esters  1 g Oral Daily  . potassium chloride  20 mEq Oral Daily  . psyllium  1 packet Oral Daily  . sodium chloride  3 mL Intravenous Q12H  . sodium chloride  3 mL Intravenous Q12H  . tiotropium  18 mcg Inhalation Daily   Continuous Infusions:   Time spent on care of this patient: 35 min   Sachi Boulay, MD  [facility] Office  [phone] Pager - Text Page per Loretha Stapler as per below:  On-Call/Text Page:      [email or web address]      password [email or web address]  If 7PM-7AM, please contact night-coverage [email or web address] Password [email or web address] 12/09/2012, 2:48 PM   LOS: 1 day

## 2012-12-09 NOTE — ED Provider Notes (Signed)
I saw and evaluated the patient, reviewed the resident's note and I agree with the findings and plan. I have reviewed EKG and agree with the resident interpretation.  you Pt with sx most consistent with peripheral vertigo.  No systemic or infectious sx.  Normal neuro exam without weakness, ataxia or cerebellar findings on exam.  Normal vision.  Sx are reproducible with movement of the head and attempting to walk.  No hx of Stroke and low likelihood.  No risk factors and normal VS.  Also c/o of cp and not feeling well with new hyponatremia of 117 without cause.  Pt had neg lexicon scan recently but has been taking multiple abx for persistent UTI.  Feel pt needs admission for hyponatremia w/u and vertigo treatment.  Gwyneth Sprout, MD 12/09/12 2249

## 2012-12-09 NOTE — Progress Notes (Signed)
Pt to TX to [id number], called report.

## 2012-12-09 NOTE — Consult Note (Addendum)
CARDIOLOGY CONSULT NOTE   Patient ID: Angela Blackburn MRN: [id number] DOB/AGE: 77-Feb-1934 77 y.o.  Admit date: 12/08/2012  Primary Physician   Herb Grays, MD Primary Cardiologist   Nahser, Philip MD Reason for Consultation   Newly discovered AFib  PIR:JJOACZY Angela Blackburn is a 79 y.o. female with HTN, hyperlipidemia, and chronic intermittent chest pain who presented to the ED last night complaining of dizziness and chest pain which both started yesterday. The dizziness began yesterday morning and was fairly sudden onset. She felt that it was intermittent throughout the day. It would worsen when she would try to stand and improve once she sat down. She has never experienced episodes like this before. The dizziness has improved today. The CP began yesterday morning around the same time as the dizziness. She describes it as a tightness that was intermittent throughout the day and was not associated with exertion. It goes across the chest without any radiation. She has been CP free most of the day today. Her chest tightness episodes yesterday and today are very similar to her chronic episodes. She has these freq, approx once per month. Her most recent stress myoview was 12/10/11 and was normal with no evidence of ischemia. Her CP today is the same as it was prior to her most recent normal stress.   In the ED a head CT showed no evidence of acute intracranial pathology (of note, there was a 1.2 cm subcutaneous nodule at the vertex that was read as most likely benign). Troponin was negative x2 and BNP was 872. She was found to be hyponatremic at 117 and hypokalemic at 2.9. She was started on replacement therapy for both and admitted. CXR showed mild vascular congestion. Once admitted it was realized that she was in AFib. She did not feel this at all and was unaware she was in AFib. No history of AFib. Her EKG back to the ED also shows AFib. She has been relatively well rate controlled in the 70-90s  without rate-controlling medications. ECG from one year ago shows NSR.    Past Medical History  Diagnosis Date  . Hypertension   . Chest pain   . Hypothyroidism   . Hyperlipidemia   . Knee pain   . Peripheral neuropathy   . Fibromyalgia      Past Surgical History  Procedure Laterality Date  . US echocardiography  09/14/2007    EF 55-60%  . Cardiovascular stress test  08/29/2009    EF 86%    Allergies  Allergen Reactions  . Crestor [Rosuvastatin Calcium] Other (See Comments)    myalgia  . Lipitor [Atorvastatin Calcium] Other (See Comments)  . Livalo [Pitavastatin Calcium] Other (See Comments)    myalgia  . Other Other (See Comments)    All CHOLESTEROL MEDS-cause myalgia  . Zetia [Ezetimibe] Other (See Comments)    myalgia    I have reviewed the patient'Angela current medications . amLODipine  10 mg Oral Daily  . aspirin  81 mg Oral Daily  . calcium carbonate  1 tablet Oral Q breakfast  . cefTRIAXone (ROCEPHIN)  IV  1 g Intravenous Q24H  . docusate sodium  100 mg Oral BID  . enoxaparin (LOVENOX) injection  40 mg Subcutaneous Q24H  . levothyroxine  125 mcg Oral QAC breakfast  . multivitamin-lutein  1 capsule Oral Daily  . nortriptyline  75 mg Oral QHS  . omega-3 acid ethyl esters  1 g Oral Daily  . potassium chloride  20  mEq Oral Daily  . psyllium  1 packet Oral Daily  . sodium chloride  3 mL Intravenous Q12H  . sodium chloride  3 mL Intravenous Q12H  . tiotropium  18 mcg Inhalation Daily     acetaminophen, acetaminophen, albuterol, hydrALAZINE, ondansetron (ZOFRAN) IV, ondansetron, polyvinyl alcohol  Prior to Admission medications   Medication Sig  acetaminophen (TYLENOL) 500 MG tablet Take 1,000 mg by mouth every 6 (six) hours as needed for pain.  albuterol (PROAIR HFA) 108 (90 BASE) MCG/ACT inhaler Inhale 2 puffs into the lungs every 6 (six) hours as needed for shortness of breath.   amLODipine (NORVASC) 10 MG tablet Take 10 mg by mouth daily.   aspirin 81 MG  tablet Take 81 mg by mouth daily.    calcium carbonate (OS-CAL) 600 MG TABS tablet Take 600 mg by mouth daily with breakfast.  Cholecalciferol (VITAMIN D) 2000 UNITS tablet Take 2,000 Units by mouth daily.  docusate sodium (COLACE) 100 MG capsule Take 100 mg by mouth 2 (two) times daily.  fish oil-omega-3 fatty acids 1000 MG capsule Take 1 g by mouth daily.  Glucosamine-Chondroit-Vit C-Mn (GLUCOSAMINE CHONDR 1500 COMPLX) CAPS Take 1 capsule by mouth daily.  hydrochlorothiazide (HYDRODIURIL) 25 MG tablet Take 25 mg by mouth daily.  levothyroxine (SYNTHROID, LEVOTHROID) 125 MCG tablet Take 125 mcg by mouth daily.    methylcellulose (ARTIFICIAL TEARS) 1 % ophthalmic solution Place 1 drop into both eyes as needed (for dry eyes).  Multiple Vitamins-Minerals (ICAPS) CAPS Take 1 capsule by mouth daily.  nitrofurantoin, macrocrystal-monohydrate, (MACROBID) 100 MG capsule Take 100 mg by mouth 2 (two) times daily.  nortriptyline (PAMELOR) 75 MG capsule Take 75 mg by mouth at bedtime.    potassium chloride (KLOR-CON) 20 MEQ packet Take 20 mEq by mouth daily.  psyllium (METAMUCIL) 58.6 % packet Take 1 packet by mouth daily.  tiotropium (SPIRIVA) 18 MCG inhalation capsule Place 18 mcg into inhaler and inhale daily.     History   Social History  . Marital Status: Married    Spouse Name: N/A    Number of Children: N/A  . Years of Education: N/A   Occupational History  . Retired    Social History Main Topics  . Smoking status: Never Smoker   . Smokeless tobacco: Not on file  . Alcohol Use: No  . Drug Use: No  . Sexual Activity:    Other Topics Concern  . Not on file   Social History Narrative  . No narrative on file    Family Status  Relation Status Death Age  . Mother Deceased    Family History  Problem Relation Age of Onset  . Heart failure Mother       Physical Exam: Blood pressure 115/71, pulse 96, temperature 98.7 F (37.1 C), temperature source Oral, resp. rate 21, height  5\' 6"  (1.676 m), weight 188 lb 4.4 oz (85.4 kg), SpO2 95.00%.  General: Well developed, well nourished, female in no acute distress Lungs: Bibasilar crackles. No wheezes are rhonchi.  Heart: Irregularly irregular rhythm. S1 S2, no rub/gallop, pulses are 2+ all 4 extrem.   Neck: No carotid bruits. JVD at 10 cm. Abdomen: Bowel sounds present, abdomen soft and non-tender without masses. Extremities: 1+ Bilateral LE edema. Neuro: Alert and oriented X 3. No focal deficits noted. Psych:  Good affect, responds appropriately  Labs:   Lab Results  Component Value Date   WBC 5.5 12/09/2012   HGB 11.4* 12/09/2012   HCT 30.9* 12/09/2012  MCV 86.6 12/09/2012   PLT 236 12/09/2012     Recent Labs Lab 12/09/12 1218  NA 127*  K 3.9  CL 89*  CO2 26  BUN 9  CREATININE 0.86  CALCIUM 9.4  GLUCOSE 96    Recent Labs  12/09/12 0614 12/09/12 1246  TROPONINI <0.30 <0.30    Recent Labs  12/08/12 2007  TROPIPOC 0.00   Pro B Natriuretic peptide (BNP)  Date/Time Value Range Status  12/08/2012  7:49 PM 872.8* 0 - 450 pg/mL Final   Echo: ordered  ECG:  AFib with HR 83 bpm. Inferior and lateral ST/T wave changes which are unchanged from 10/12 and 10/13 EKGs, though these prev EKGs are NSR.  Radiology:  Dg Chest 2 View 12/08/2012   *RADIOLOGY REPORT*  Clinical Data: Chest pain and dizziness.  CHEST - 2 VIEW  Comparison: None.  Findings: The lungs are well-aerated.  Mild vascular congestion is noted.  There is no evidence of focal opacification, pleural effusion or pneumothorax.  The heart is borderline normal in size; the mediastinal contour is within normal limits.  No acute osseous abnormalities are seen.  IMPRESSION: Mild vascular congestion noted; no acute cardiopulmonary process seen.   Original Report Authenticated By: Tonia Ghent, M.D.   Ct Head Wo Contrast 12/08/2012   *RADIOLOGY REPORT*  Clinical Data: Nausea, dizziness and shortness of breath; weakness and fatigue.  CT HEAD WITHOUT  CONTRAST  Technique:  Contiguous axial images were obtained from the base of the skull through the vertex without contrast.  Comparison: None.  Findings: There is no evidence of acute infarction, mass lesion, or intra- or extra-axial hemorrhage on CT.  Prominence of the sulci suggests mild cortical volume loss. Diffuse periventricular and subcortical white matter change likely reflects small vessel ischemic microangiopathy.  The brainstem and fourth ventricle are within normal limits.  The basal ganglia are unremarkable in appearance.  The cerebral hemispheres demonstrate grossly normal gray-white differentiation. No mass effect or midline shift is seen.  There is no evidence of fracture; visualized osseous structures are unremarkable in appearance.  The visualized portions of the orbits are within normal limits.  The paranasal sinuses and mastoid air cells are well-aerated.  A 1.2 cm subcutaneous nodule at the vertex is likely benign in nature.  IMPRESSION:  1.  No acute intracranial pathology seen on CT. 2.  Mild cortical volume loss and diffuse small vessel ischemic microangiopathy. 3.  1.2 cm subcutaneous nodule at the vertex is likely benign in nature.   Original Report Authenticated By: Tonia Ghent, M.D.    ASSESSMENT AND PLAN:   The patient was seen today by Dr. Tenny Craw, the patient evaluated and the data reviewed.   Angela Blackburn is a 79 year old female with no known cardiac history who presented to the ED with atypical chest tightness and dizziness. She was found to be hyponatremic and hypokalemic and was admitted. EKG shows new AFib.   1. Chest pain: Angela Blackburn has a history of chronic chest tightness and sees Dr. Elease Blackburn. Normal MV one year ago,  CP unchanged since this time. As her troponins have come back negative x2, with the 2nd being drawn >24 hours after onset of intermittent CP, feel her sx are unlikely 2/2 coronary disease. No further ischemic workup now, she will continue to follow with Dr.  Elease Blackburn.   2. Newly discovered AFib: Noted this hospitalization. 2013 EKG is NSR.Occasional palps, for years. None in the past few days. She cannot feel that she is  in an abnormal rhythm. She has been relatively well rate-controlled in the 70-90s. Echo is pending. TSH pending. She was both hyponatremic and hypokalemic upon admission and this could be playing a role in her AFib. These have since been corrected though she remains in AFib. As she has a CHADSVASC score of 4, rec starting anticoagulation. No history of excessive bleeds. No history blood in stool or hematuria. Will start Eliquis. Tomorrow, case manager to see if she can afford any of the NOACs with her insurance. She is currently well rate-controlled on no meds. F/u in clinic with Dr. Elease Blackburn. Consider DCCV in 3-4 weeks if electrolytes remain stable.   3. Hyponatremia: 117 at admission. IM feels this may be 2/2 SIADH. IM correcting. Currently at 127. Per IM  4. Hypokalemia: 2.9 at admission. Angela/p replacement, now at 3.9. Angela Blackburn is on home HCTZ which is currently being held. F/u with Dr. Elease Blackburn.   5  Dizziness  Denies now  May have been mulitfactorial  Follow as ambulate  6.  Edema  Patient with some perip edema  Also rales on exam  Echo pending  Would give 1 dose of lasix 40 mg  Received IV fluids yesterday.  Follow  Check labs in AM    5. HTN: Continue with amlodipine.   6. Nodule on head CT: 1.2 cm subcutaneuous nodule at the vertex found on Head CT. Felt to be likely benign in nature per radiology report. Rec f/u with PCP.     SignedTheodore Demark, PA-C 12/09/2012 6:33 PM Beeper [phone]   Patient seen and examined  I have amended note above to reflect my findings  Echo pending  Would begin anticoagulation  Ambulate to follow HR  Dietrich Pates 12/09/2012

## 2012-12-09 NOTE — H&P (Signed)
[facility] History and Physical  Angela Blackburn:[id number] DOB: 12/15/1932 DOA: 12/08/2012  Referring physician: ER physician. PCP: Herb Grays, MD  Specialists: Dr. Elease Hashimoto. Cardiologist.  Chief Complaint: Dizziness.  HPI: Angela Blackburn is a 79 y.o. female with history of hypertension and hypothyroidism started experiencing dizziness since last afternoon. Patient was dizzy on trying to stand. Did not have any focal deficits or any headache blurred vision or difficulty speaking. Since the dizziness persisted she came to the ER. In the ER patient was found to be nonfocal on exam and labs showed severe hyponatremia with sodium 117. Patient states that she has been recently treated for urinary tract infection for last 4 weeks. Patient had ciprofloxacin at least 2 courses with recently been placed on Macrobid. Since she had urinary tract infection she has been trying to drink a lot of fluid last 2 days. Patient also noticed increasing swelling of the lower extremities last few days. Patient has been having some nausea last few days denies any vomiting abdominal pain or diarrhea. Patient otherwise denies any shortness of breath but has chronic chest tightness which has been evaluated by patient's cardiologist. Presently patient is chest pain-free. On exam patient is nonfocal. Has mild edema in the lower extremities. Chest x-ray shows congestion. CT head is negative. Patient has been admitted for further management.  Review of Systems: As presented in the history of presenting illness, rest negative.  Past Medical History  Diagnosis Date  . Hypertension   . Chest pain   . Hypothyroidism   . Hyperlipidemia   . Knee pain   . Peripheral neuropathy   . Fibromyalgia    Past Surgical History  Procedure Laterality Date  . US echocardiography  09/14/2007    EF 55-60%  . Cardiovascular stress test  08/29/2009    EF 86%   Social History:  reports that she has never smoked. She does not  have any smokeless tobacco history on file. She reports that she does not drink alcohol or use illicit drugs. Home. where does patient live-- yes. Can patient participate in ADLs?  Allergies  Allergen Reactions  . Crestor [Rosuvastatin Calcium] Other (See Comments)    myalgia  . Lipitor [Atorvastatin Calcium] Other (See Comments)  . Livalo [Pitavastatin Calcium] Other (See Comments)    myalgia  . Other Other (See Comments)    All CHOLESTEROL MEDS-cause myalgia  . Zetia [Ezetimibe] Other (See Comments)    myalgia    Family History  Problem Relation Age of Onset  . Heart failure Mother       Prior to Admission medications   Medication Sig Start Date End Date Taking? Authorizing Provider  acetaminophen (TYLENOL) 500 MG tablet Take 1,000 mg by mouth every 6 (six) hours as needed for pain.   Yes Historical Provider, MD  albuterol (PROAIR HFA) 108 (90 BASE) MCG/ACT inhaler Inhale 2 puffs into the lungs every 6 (six) hours as needed for shortness of breath.    Yes Historical Provider, MD  amLODipine (NORVASC) 10 MG tablet Take 10 mg by mouth daily.    Yes Historical Provider, MD  aspirin 81 MG tablet Take 81 mg by mouth daily.     Yes Historical Provider, MD  calcium carbonate (OS-CAL) 600 MG TABS tablet Take 600 mg by mouth daily with breakfast.   Yes Historical Provider, MD  Cholecalciferol (VITAMIN D) 2000 UNITS tablet Take 2,000 Units by mouth daily.   Yes Historical Provider, MD  docusate sodium (COLACE) 100 MG capsule  Take 100 mg by mouth 2 (two) times daily.   Yes Historical Provider, MD  fish oil-omega-3 fatty acids 1000 MG capsule Take 1 g by mouth daily.   Yes Historical Provider, MD  Glucosamine-Chondroit-Vit C-Mn (GLUCOSAMINE CHONDR 1500 COMPLX) CAPS Take 1 capsule by mouth daily.   Yes Historical Provider, MD  hydrochlorothiazide (HYDRODIURIL) 25 MG tablet Take 25 mg by mouth daily. 09/20/10  Yes Vesta Mixer, MD  levothyroxine (SYNTHROID, LEVOTHROID) 125 MCG tablet Take 125  mcg by mouth daily.     Yes Historical Provider, MD  methylcellulose (ARTIFICIAL TEARS) 1 % ophthalmic solution Place 1 drop into both eyes as needed (for dry eyes).   Yes Historical Provider, MD  Multiple Vitamins-Minerals (ICAPS) CAPS Take 1 capsule by mouth daily.   Yes Historical Provider, MD  nitrofurantoin, macrocrystal-monohydrate, (MACROBID) 100 MG capsule Take 100 mg by mouth 2 (two) times daily.   Yes Historical Provider, MD  nortriptyline (PAMELOR) 75 MG capsule Take 75 mg by mouth at bedtime.     Yes Historical Provider, MD  potassium chloride (KLOR-CON) 20 MEQ packet Take 20 mEq by mouth daily.   Yes Historical Provider, MD  psyllium (METAMUCIL) 58.6 % packet Take 1 packet by mouth daily.   Yes Historical Provider, MD  tiotropium (SPIRIVA) 18 MCG inhalation capsule Place 18 mcg into inhaler and inhale daily.   Yes Historical Provider, MD   Physical Exam: Filed Vitals:   12/08/12 2009 12/08/12 2030 12/08/12 2043 12/08/12 2200  BP: 153/73 129/95  136/80  Pulse: 86 78  81  Temp:   98.1 F (36.7 C)   TempSrc:      Resp: 18 20  26   Weight:      SpO2: 98% 96%  95%     General:  Well-developed and nourished.  Eyes: Anicteric no pallor.  ENT: No discharge from ears eyes nose mouth.  Neck: No mass felt. No JVD appreciated.  Cardiovascular: S1-S2 heard.  Respiratory: No rhonchi or crepitations.  Abdomen: Soft nontender bowel sounds present.  Skin: No rash.  Musculoskeletal: Mild edema both lower extremities.  Psychiatric: Appears normal.  Neurologic: Alert awake oriented to time place and person. Moves all extremities.  Labs on Admission:  Basic Metabolic Panel:  Recent Labs Lab 12/08/12 1949  NA 117*  K 2.9*  CL 78*  CO2 25  GLUCOSE 134*  BUN 14  CREATININE 0.77  CALCIUM 9.0   Liver Function Tests: No results found for this basename: AST, ALT, ALKPHOS, BILITOT, PROT, ALBUMIN,  in the last 168 hours No results found for this basename: LIPASE, AMYLASE,   in the last 168 hours No results found for this basename: AMMONIA,  in the last 168 hours CBC:  Recent Labs Lab 12/08/12 1949  WBC 8.2  HGB 12.4  HCT 34.3*  MCV 88.6  PLT 274   Cardiac Enzymes: No results found for this basename: CKTOTAL, CKMB, CKMBINDEX, TROPONINI,  in the last 168 hours  BNP (last 3 results)  Recent Labs  12/08/12 1949  PROBNP 872.8*   CBG: No results found for this basename: GLUCAP,  in the last 168 hours  Radiological Exams on Admission: Dg Chest 2 View  12/08/2012   *RADIOLOGY REPORT*  Clinical Data: Chest pain and dizziness.  CHEST - 2 VIEW  Comparison: None.  Findings: The lungs are well-aerated.  Mild vascular congestion is noted.  There is no evidence of focal opacification, pleural effusion or pneumothorax.  The heart is borderline normal in size; the  mediastinal contour is within normal limits.  No acute osseous abnormalities are seen.  IMPRESSION: Mild vascular congestion noted; no acute cardiopulmonary process seen.   Original Report Authenticated By: Tonia Ghent, M.D.   Ct Head Wo Contrast  12/08/2012   *RADIOLOGY REPORT*  Clinical Data: Nausea, dizziness and shortness of breath; weakness and fatigue.  CT HEAD WITHOUT CONTRAST  Technique:  Contiguous axial images were obtained from the base of the skull through the vertex without contrast.  Comparison: None.  Findings: There is no evidence of acute infarction, mass lesion, or intra- or extra-axial hemorrhage on CT.  Prominence of the sulci suggests mild cortical volume loss. Diffuse periventricular and subcortical white matter change likely reflects small vessel ischemic microangiopathy.  The brainstem and fourth ventricle are within normal limits.  The basal ganglia are unremarkable in appearance.  The cerebral hemispheres demonstrate grossly normal gray-white differentiation. No mass effect or midline shift is seen.  There is no evidence of fracture; visualized osseous structures are unremarkable in  appearance.  The visualized portions of the orbits are within normal limits.  The paranasal sinuses and mastoid air cells are well-aerated.  A 1.2 cm subcutaneous nodule at the vertex is likely benign in nature.  IMPRESSION:  1.  No acute intracranial pathology seen on CT. 2.  Mild cortical volume loss and diffuse small vessel ischemic microangiopathy. 3.  1.2 cm subcutaneous nodule at the vertex is likely benign in nature.   Original Report Authenticated By: Tonia Ghent, M.D.    EKG: Independently reviewed. Artifacts with non-specific ST-T changes.  Assessment/Plan Principal Problem:   Hyponatremia Active Problems:   HTN (hypertension)   Chest tightness   Dizziness   Hypokalemia   1. Severe hyponatremia - given the patient's mild edema peripherally and patient stating that she has been drinking a lot of fluid I will hold further IV fluids(patient did receive 2 L normal saline in the ER) and place patient on fluid restriction. Pending urine osmolality and further tests including uric acid, TSH, cortisol levels. Closely follow metabolic panel frequently to check for sodium trends. Discontinue HCTZ and patient probably should not be on HCTZ. 2. Dizziness - presently nonfocal. CT head is negative for anything acute. Denies any tinnitus or hearing loss. Had mild nausea but denies any headache or blurred vision and patient is nonfocal. Check orthostatic vital signs. May consider MRI brain if dizziness persists. 3. Hypertension - discontinue HCTZ secondary to severe hyponatremia. Patient is continued on amlodipine and when necessary IV hydralazine for systolic blood pressure was 160. 4. Recently treated for urinary tract infection - check UA. 5. Hypothyroidism - check TSH continue Synthroid. 6. Chest tightness, chronic has had previous negative Myoview per Dr. Harvie Bridge notes - check troponins.    Code Status: Full code.  Family Communication: Patient's husband at the bedside.  Disposition Plan:  Admit to inpatient.    Aslyn Cottman N. [facility] Pager [phone].  If 7PM-7AM, please contact night-coverage [email or web address] Password [email or web address] 12/09/2012, 12:22 AM

## 2012-12-10 DIAGNOSIS — I059 Rheumatic mitral valve disease, unspecified: Secondary | ICD-10-CM

## 2012-12-10 LAB — URINE CULTURE: Colony Count: >=100000

## 2012-12-10 LAB — BASIC METABOLIC PANEL
BUN: 14 mg/dL (ref 6–23)
Calcium: 8.7 mg/dL (ref 8.4–10.5)
Chloride: 95 mEq/L — ABNORMAL LOW (ref 96–112)
Creatinine, Ser: 0.98 mg/dL (ref 0.50–1.10)
GFR calc Af Amer: 62 mL/min — ABNORMAL LOW (ref >90)
GFR calc non Af Amer: 54 mL/min — ABNORMAL LOW (ref >90)

## 2012-12-10 MED ORDER — CIPROFLOXACIN HCL 250 MG PO TABS
250.0000 mg | ORAL_TABLET | Freq: Two times a day (BID) | ORAL | Status: DC
  Administered 2012-12-10: 250 mg via ORAL
  Filled 2012-12-10 (×3): qty 1

## 2012-12-10 MED ORDER — POTASSIUM CHLORIDE CRYS ER 20 MEQ PO TBCR
20.0000 meq | EXTENDED_RELEASE_TABLET | Freq: Every day | ORAL | Status: DC
  Administered 2012-12-11: 20 meq via ORAL
  Filled 2012-12-10: qty 1

## 2012-12-10 MED ORDER — FUROSEMIDE 10 MG/ML IJ SOLN: 20.0000 mg | Freq: Two times a day (BID) | INTRAMUSCULAR | Status: DC

## 2012-12-10 MED ORDER — FUROSEMIDE 40 MG PO TABS
40.0000 mg | ORAL_TABLET | Freq: Two times a day (BID) | ORAL | Status: DC
  Administered 2012-12-10 – 2012-12-11 (×2): 40 mg via ORAL
  Filled 2012-12-10 (×4): qty 1

## 2012-12-10 NOTE — Evaluation (Addendum)
Physical Therapy Evaluation Patient Details Name: Angela Blackburn MRN: [id number] DOB: 07-02-1932 Today's Date: 12/10/2012 Time: 2440-1027 PT Time Calculation (min): 17 min  PT Assessment / Plan / Recommendation History of Present Illness  Patient is a 79 yo female admitted with dizziness, severe hyponatremia, and recent UTI.  Patient found to be in a-fib (new this admisison).  Clinical Impression  Patient is at mod I level with all mobility and gait.  Good balance with gait. No c/o dizziness throughout session. Encouraged patient to use her cane at home for additional stability.  No acute PT needs identified - PT will sign off.  Recommended ambulation in hallway with nursing or husband.    PT Assessment  Patent does not need any further PT services    Follow Up Recommendations  No PT follow up;Supervision for mobility/OOB    Does the patient have the potential to tolerate intense rehabilitation      Barriers to Discharge        Equipment Recommendations  None recommended by PT    Recommendations for Other Services     Frequency      Precautions / Restrictions Precautions Precautions: None Restrictions Weight Bearing Restrictions: No   Pertinent Vitals/Pain       Mobility  Bed Mobility Bed Mobility: Supine to Sit;Sit to Supine Supine to Sit: 7: Independent;HOB flat Sit to Supine: 7: Independent;HOB flat Transfers Transfers: Sit to Stand;Stand to Sit Sit to Stand: 6: Modified independent (Device/Increase time);With upper extremity assist;From bed Stand to Sit: 6: Modified independent (Device/Increase time);With upper extremity assist;To bed Details for Transfer Assistance: No cues needed.  Increased time Ambulation/Gait Ambulation/Gait Assistance: 5: Supervision Ambulation Distance (Feet): 200 Feet Assistive device: Rolling walker;None Ambulation/Gait Assistance Details: Patient with good balance with and without RW.  Good gait pattern and speed.  No loss of  balance noted with ambulation. Gait Pattern: Step-through pattern;Decreased stride length General Gait Details: HR ranged from 74 - 85 pbm during gait.        PT Goals(Current goals can be found in the care plan section)  N/A  Visit Information  Last PT Received On: 12/10/12 Assistance Needed: +1 History of Present Illness: Patient is a 79 yo female admitted with dizziness, severe hyponatremia, and recent UTI.  Patient found to be in a-fib (new this admisison).       Prior Functioning  Home Living Family/patient expects to be discharged to:: Private residence Living Arrangements: Spouse/significant other Available Help at Discharge: Family;Available 24 hours/day Type of Home: House Home Access: Stairs to enter [employer] of Steps: 3 Entrance Stairs-Rails: None Home Layout: One level Home Equipment: Walker - 2 wheels;Cane - single point;Bedside commode;Shower seat Prior Function Level of Independence: Independent Comments: Active, does water aerobics 2x/week Communication Communication: No difficulties    Cognition  Cognition Arousal/Alertness: Awake/alert Behavior During Therapy: WFL for tasks assessed/performed Overall Cognitive Status: Within Functional Limits for tasks assessed    Extremity/Trunk Assessment Upper Extremity Assessment Upper Extremity Assessment: Overall WFL for tasks assessed Lower Extremity Assessment Lower Extremity Assessment: Overall WFL for tasks assessed   Balance Balance Balance Assessed: Yes High Level Balance High Level Balance Activites: Direction changes;Turns;Head turns High Level Balance Comments: No loss of balance with high level balance activities.  End of Session PT - End of Session Equipment Utilized During Treatment: Gait belt Activity Tolerance: Patient tolerated treatment well Patient left: in bed;with call bell/phone within reach;with family/visitor present Nurse Communication: Mobility status (Encouraged  ambulation in hallway with nursing)  GP     Vena Austria 12/10/2012, 4:34 PM Durenda Hurt. Renaldo Fiddler, [facility] Acute Rehab Services Pager [phone]

## 2012-12-10 NOTE — Progress Notes (Addendum)
[facility] PROGRESS NOTE  Angela Blackburn ZOX:[id number] DOB: August 24, 1932 DOA: 12/08/2012 PCP: Herb Grays, MD  Assessment/Plan: Principal Problem:   Hyponatremia: Possibly SIADH. However, patient does be that she drinks a lot of water every day, approximately 6 classes at least. Then the day before her symptoms started, she thought she was dehydrated and drinks 10 glasses. Question if this could be accidental polydipsia. Patient on fluid restriction now and sodium slightly improving. Now up to 124. Noted elevated BNP with normal echocardiogram which may be more consistent with polydipsia. Will try oral Lasix. Active Problems:   HTN (hypertension): HCTZ discontinued secondary to severe hyponatremia.   Chest tightness: Troponins negative. Cardiology feels is more likely symptoms not from coronary disease.   Dizziness: Resolved. Likely from initial hyponatremia   Hypokalemia   Atrial fibrillation: Rate controlled. Appreciate cardiology followup. Started on eliquis.  Chads score pending. Anticoagulation recommended. BNP in part may be elevated because of atrial fibrillation UTI: Started on Rocephin and then changed to Cipro after IV access lost.  Pharmacy recommends that likely this is colonization given multiple recurrences without symptoms. I would tend to agree. Cipro discontinued Hypothyroidism: TSH normal  Code Status: Full code Family Communication: Spoke with husband at the bedside  Disposition Plan: If sodium improved, possibly discharge home tomorrow   Consultants:  Cardiology  Procedures:  Echocardiogram done 10/10, normal systolic and diastolic function, mild mitral regurg  Antibiotics:  Rocephin times one dose on 10/9  Cipro started 10/10 in discontinued after one dose  HPI/Subjective: Patient doing okay. A little tired. Denies any chest pain or shortness of breath. Overall, much improved when she first came in.  Objective: Filed Vitals:   12/10/12 1417  BP:  110/67  Pulse: 81  Temp: 97.8 F (36.6 C)  Resp: 16    Intake/Output Summary (Last 24 hours) at 12/10/12 1549 Last data filed at 12/10/12 1200  Gross per 24 hour  Intake    720 ml  Output    500 ml  Net    220 ml   Filed Weights   12/08/12 1945 12/08/12 2350 12/10/12 0500  Weight: 77.168 kg (170 lb 2 oz) 85.4 kg (188 lb 4.4 oz) 84.913 kg (187 lb 3.2 oz)    Exam:   General:  Alert and oriented x3, no acute distress  Cardiovascular: Irregular rhythm, rate controlled  Respiratory: Clear to auscultation bilaterally  Abdomen: Soft, nontender, nondistended, positive bowel sounds  Musculoskeletal: No clubbing or cyanosis, trace pitting edema   Data Reviewed: Basic Metabolic Panel:  Recent Labs Lab 12/08/12 1949 12/09/12 0612 12/09/12 0818 12/09/12 1218 12/09/12 1820  NA 117* 123* 125* 127* 127*  K 2.9* 3.0* 4.3 3.9 3.9  CL 78* 85* 88* 89* 90*  CO2 25 25 26 26 27   GLUCOSE 134* 102* 120* 96 101*  BUN 14 8 8 9 11   CREATININE 0.77 0.69 0.76 0.86 0.98  CALCIUM 9.0 8.5 8.9 9.4 9.0   CBC:  Recent Labs Lab 12/08/12 1949 12/09/12 0612  WBC 8.2 5.5  HGB 12.4 11.4*  HCT 34.3* 30.9*  MCV 88.6 86.6  PLT 274 236   Cardiac Enzymes:  Recent Labs Lab 12/09/12 0614 12/09/12 1246  TROPONINI <0.30 <0.30   BNP (last 3 results)  Recent Labs  12/08/12 1949  PROBNP 872.8*   CBG: No results found for this basename: GLUCAP,  in the last 168 hours  Recent Results (from the past 240 hour(s))  MRSA PCR SCREENING     Status: None  Collection Time    12/08/12 11:54 PM      Result Value Range Status   MRSA by PCR NEGATIVE  NEGATIVE Final   Comment:            The GeneXpert MRSA Assay (FDA     approved for NASAL specimens     only), is one component of a     comprehensive MRSA colonization     surveillance program. It is not     intended to diagnose MRSA     infection nor to guide or     monitor treatment for     MRSA infections.  URINE CULTURE     Status:  None   Collection Time    12/09/12 12:25 AM      Result Value Range Status   Specimen Description URINE, CATHETERIZED   Final   Special Requests CX ADDED AT 0141 ON 161096   Final   Culture  Setup Time     Final   Value: 12/09/2012 02:22     Performed at [facility]   Colony Count     Final   Value: >=100,000 COLONIES/ML     Performed at [facility]   Culture     Final   Value: ESCHERICHIA COLI     Performed at [facility]   Report Status PENDING   Incomplete     Studies: Dg Chest 2 View  12/08/2012     IMPRESSION: Mild vascular congestion noted; no acute cardiopulmonary process seen.   Original Report Authenticated By: Tonia Ghent, M.D.   Ct Head Wo Contrast  12/08/2012     IMPRESSION:  1.  No acute intracranial pathology seen on CT. 2.  Mild cortical volume loss and diffuse small vessel ischemic microangiopathy. 3.  1.2 cm subcutaneous nodule at the vertex is likely benign in nature.   Original Report Authenticated By: Tonia Ghent, M.D.    Scheduled Meds: . amLODipine  10 mg Oral Daily  . aspirin  81 mg Oral Daily  . calcium carbonate  1 tablet Oral Q breakfast  . ciprofloxacin  250 mg Oral BID  . docusate sodium  100 mg Oral BID  . enoxaparin (LOVENOX) injection  40 mg Subcutaneous Q24H  . furosemide  40 mg Oral BID  . levothyroxine  125 mcg Oral QAC breakfast  . multivitamin-lutein  1 capsule Oral Daily  . nortriptyline  75 mg Oral QHS  . omega-3 acid ethyl esters  1 g Oral Daily  . [START ON 12/11/2012] potassium chloride  20 mEq Oral Daily  . psyllium  1 packet Oral Daily  . tiotropium  18 mcg Inhalation Daily   Continuous Infusions:   Principal Problem:   Hyponatremia Active Problems:   HTN (hypertension)   Chest tightness   Dizziness   Hypokalemia   Atrial fibrillation    Time spent: 30 minutes    Hollice Espy  [facility] Pager [phone]. If 7PM-7AM, please contact night-coverage at [email or web address], password  [facility] 12/10/2012, 3:49 PM  LOS: 2 days

## 2012-12-10 NOTE — Progress Notes (Signed)
  Echocardiogram 2D Echocardiogram has been performed.  Angela Blackburn 12/10/2012, 11:05 AM

## 2012-12-11 LAB — BASIC METABOLIC PANEL
BUN: 17 mg/dL (ref 6–23)
CO2: 29 mEq/L (ref 19–32)
Calcium: 8.2 mg/dL — ABNORMAL LOW (ref 8.4–10.5)
Creatinine, Ser: 1.01 mg/dL (ref 0.50–1.10)
GFR calc Af Amer: 60 mL/min — ABNORMAL LOW (ref >90)
GFR calc non Af Amer: 52 mL/min — ABNORMAL LOW (ref >90)
Glucose, Bld: 84 mg/dL (ref 70–99)
Potassium: 3.6 mEq/L (ref 3.5–5.1)
Sodium: 135 mEq/L (ref 135–145)

## 2012-12-11 MED ORDER — APIXABAN 5 MG PO TABS: 5.0000 mg | ORAL_TABLET | Freq: Two times a day (BID) | ORAL | Status: DC

## 2012-12-11 MED ORDER — FUROSEMIDE 40 MG PO TABS: 40.0000 mg | ORAL_TABLET | Freq: Every day | ORAL | Status: DC

## 2012-12-11 MED ORDER — APIXABAN 5 MG PO TABS
5.0000 mg | ORAL_TABLET | Freq: Two times a day (BID) | ORAL | Status: DC
  Administered 2012-12-11: 5 mg via ORAL
  Filled 2012-12-11 (×2): qty 1

## 2012-12-11 MED ORDER — OFF THE BEAT BOOK
Freq: Once | Status: AC
  Administered 2012-12-11: 12:00:00
  Filled 2012-12-11: qty 1

## 2012-12-11 NOTE — Progress Notes (Signed)
Patient Name: Angela Blackburn      SUBJECTIVE: 79 year old woman with no known cardiac history admitted with dizziness and her labs were notable for hyponatremia? SIADH and significant hypokalemia.[name] She was found to be in atrial fibrillation. With a CHADS-VASc score of 4 she was started on apixaban. She was rate controlled.  withut complaints and unaware of her atrial fibrillation at this point  Past Medical History  Diagnosis Date  . Hypertension   . Chest pain   . Hypothyroidism   . Hyperlipidemia   . Knee pain   . Peripheral neuropathy   . Fibromyalgia     Scheduled Meds:  Scheduled Meds: . amLODipine  10 mg Oral Daily  . calcium carbonate  1 tablet Oral Q breakfast  . docusate sodium  100 mg Oral BID  . furosemide  40 mg Oral BID  . levothyroxine  125 mcg Oral QAC breakfast  . multivitamin-lutein  1 capsule Oral Daily  . nortriptyline  75 mg Oral QHS  . omega-3 acid ethyl esters  1 g Oral Daily  . potassium chloride  20 mEq Oral Daily  . psyllium  1 packet Oral Daily  . tiotropium  18 mcg Inhalation Daily   Continuous Infusions:   PHYSICAL EXAM Filed Vitals:   12/10/12 1417 12/10/12 1556 12/10/12 2200 12/11/12 0600  BP: 110/67  97/64 113/51  Pulse: 81 85 67 69  Temp: 97.8 F (36.6 C)  98 F (36.7 C) 98.3 F (36.8 C)  TempSrc:   Oral Oral  Resp: 16  16 18   Height:      Weight:    182 lb 12.8 oz (82.918 kg)  SpO2: 100%  95% 95%    General appearance: alert, cooperative and no distress Lungs: clear to auscultation bilaterally Heart: irregularly irregular rhythm Abdomen: soft, non-tender; bowel sounds normal; no masses,  no organomegaly Extremities: extremities normal, atraumatic, no cyanosis or edema Skin: Skin color, texture, turgor normal. No rashes or lesions Neurologic: Alert and oriented X 3, normal strength and tone. Normal symmetric reflexes. Normal coordination and gait  TELEMETRY: Reviewed telemetry pt in afib controlled VR:  Echocardiogram  demonstrated normal left ventricular function with mild left atrial enlargement  Intake/Output Summary (Last 24 hours) at 12/11/12 0842 Last data filed at 12/10/12 1800  Gross per 24 hour  Intake    600 ml  Output      0 ml  Net    600 ml    LABS: Basic Metabolic Panel:  Recent Labs Lab 12/08/12 1949 12/09/12 0612 12/09/12 0818 12/09/12 1218 12/09/12 1820 12/10/12 1520 12/11/12 0500  NA 117* 123* 125* 127* 127* 131* 135  K 2.9* 3.0* 4.3 3.9 3.9 4.3 3.6  CL 78* 85* 88* 89* 90* 95* 98  CO2 25 25 26 26 27 29 29   GLUCOSE 134* 102* 120* 96 101* 95 84  BUN 14 8 8 9 11 14 17   CREATININE 0.77 0.69 0.76 0.86 0.98 0.98 1.01  CALCIUM 9.0 8.5 8.9 9.4 9.0 8.7 8.2*   Cardiac Enzymes:  Recent Labs  12/09/12 0614 12/09/12 1246  TROPONINI <0.30 <0.30   CBC:  Recent Labs Lab 12/08/12 1949 12/09/12 0612  WBC 8.2 5.5  HGB 12.4 11.4*  HCT 34.3* 30.9*  MCV 88.6 86.6  PLT 274 236   PROTIME: No results found for this basename: LABPROT, INR,  in the last 72 hours Liver Function Tests: No results found for this basename: AST, ALT, ALKPHOS, BILITOT, PROT, ALBUMIN,  in the  last 72 hours No results found for this basename: LIPASE, AMYLASE,  in the last 72 hours BNP: BNP (last 3 results)  Recent Labs  12/08/12 1949  PROBNP 872.8*   D-Dimer: No results found for this basename: DDIMER,  in the last 72 hours Hemoglobin A1C: No results found for this basename: HGBA1C,  in the last 72 hours Fasting Lipid Panel: No results found for this basename: CHOL, HDL, LDLCALC, TRIG, CHOLHDL, LDLDIRECT,  in the last 72 hours Thyroid Function Tests:  Recent Labs  12/09/12 2105  TSH 4.340     ASSESSMENT AND PLAN:  Principal Problem:   Hyponatremia Active Problems:   HTN (hypertension)   Chest tightness   Dizziness   Hypokalemia   Atrial fibrillation  Anticipate discharge today. From my point of view that is reasonable. she'll need apixaban. Follow up with Dr. Jerrye Bushy and in  the absence of symptoms with her atrial fibrillation it might be reasonable to do nothing apart from rate control and anticoagulation Signed, Sherryl Manges MD  12/11/2012

## 2012-12-11 NOTE — Progress Notes (Signed)
ANTICOAGULATION CONSULT NOTE - Initial Consult  Pharmacy Consult for apixaban Indication: Non valvular atrial fibrillation    Allergies  Allergen Reactions  . Crestor [Rosuvastatin Calcium] Other (See Comments)    myalgia  . Lipitor [Atorvastatin Calcium] Other (See Comments)  . Livalo [Pitavastatin Calcium] Other (See Comments)    myalgia  . Other Other (See Comments)    All CHOLESTEROL MEDS-cause myalgia  . Zetia [Ezetimibe] Other (See Comments)    myalgia    Patient Measurements: Height: 5\' 6"  (167.6 cm) Weight: 182 lb 12.8 oz (82.918 kg) IBW/kg (Calculated) : 59.3  Vital Signs: Temp: 98.3 F (36.8 C) (10/11 0600) Temp src: Oral (10/11 0600) BP: 113/51 mmHg (10/11 0600) Pulse Rate: 69 (10/11 0600)  Labs:  Recent Labs  12/08/12 1949 12/09/12 0612 12/09/12 0614  12/09/12 1246 12/09/12 1820 12/10/12 1520 12/11/12 0500  HGB 12.4 11.4*  --   --   --   --   --   --   HCT 34.3* 30.9*  --   --   --   --   --   --   PLT 274 236  --   --   --   --   --   --   CREATININE 0.77 0.69  --   < >  --  0.98 0.98 1.01  TROPONINI  --   --  <0.30  --  <0.30  --   --   --   < > = values in this interval not displayed.  Estimated Creatinine Clearance: 49.8 ml/min (by C-G formula based on Cr of 1.01).   Medical History: Past Medical History  Diagnosis Date  . Hypertension   . Chest pain   . Hypothyroidism   . Hyperlipidemia   . Knee pain   . Peripheral neuropathy   . Fibromyalgia     Medications:  Prescriptions prior to admission  Medication Sig Dispense Refill  . acetaminophen (TYLENOL) 500 MG tablet Take 1,000 mg by mouth every 6 (six) hours as needed for pain.      [name] albuterol (PROAIR HFA) 108 (90 BASE) MCG/ACT inhaler Inhale 2 puffs into the lungs every 6 (six) hours as needed for shortness of breath.       [name] amLODipine (NORVASC) 10 MG tablet Take 10 mg by mouth daily.       [name] aspirin 81 MG tablet Take 81 mg by mouth daily.        . calcium carbonate (OS-CAL) 600  MG TABS tablet Take 600 mg by mouth daily with breakfast.      . Cholecalciferol (VITAMIN D) 2000 UNITS tablet Take 2,000 Units by mouth daily.      [name] docusate sodium (COLACE) 100 MG capsule Take 100 mg by mouth 2 (two) times daily.      . fish oil-omega-3 fatty acids 1000 MG capsule Take 1 g by mouth daily.      . Glucosamine-Chondroit-Vit C-Mn (GLUCOSAMINE CHONDR 1500 COMPLX) CAPS Take 1 capsule by mouth daily.      . hydrochlorothiazide (HYDRODIURIL) 25 MG tablet Take 25 mg by mouth daily.      [name] levothyroxine (SYNTHROID, LEVOTHROID) 125 MCG tablet Take 125 mcg by mouth daily.        . methylcellulose (ARTIFICIAL TEARS) 1 % ophthalmic solution Place 1 drop into both eyes as needed (for dry eyes).      . Multiple Vitamins-Minerals (ICAPS) CAPS Take 1 capsule by mouth daily.      . nitrofurantoin, macrocrystal-monohydrate, (  MACROBID) 100 MG capsule Take 100 mg by mouth 2 (two) times daily.      . nortriptyline (PAMELOR) 75 MG capsule Take 75 mg by mouth at bedtime.        . potassium chloride (KLOR-CON) 20 MEQ packet Take 20 mEq by mouth daily.      . psyllium (METAMUCIL) 58.6 % packet Take 1 packet by mouth daily.      [name] tiotropium (SPIRIVA) 18 MCG inhalation capsule Place 18 mcg into inhaler and inhale daily.        Assessment: 79 y.o.f admitted to hospital for dizziness. Pt found to be in afib. Currently Afib is rate controlled. To start apixaban today. Current drug interactions include Omega 3 fatty acids and aspirin 81 mg daily. Pt weight 82.9kg. SCr 1.01  Goal of Therapy:  1) Stroke risk reduction in AFib   Monitor platelets by anticoagulation protocol: Yes   Plan:  1) Apixaban 5 mg twice daily  2) Monitor CBC for signs of bleeding 3) Provide patient education  Vinnie Level, PharmD.  [place] License #[id number] Application for [place] reciprocity pending  Clinical Pharmacist Pager [phone]  I agree with above.  Christoper Fabian, PharmD, BCPS Clinical pharmacist, pager  [phone] 12/11/2012  8:56 AM

## 2012-12-11 NOTE — Progress Notes (Signed)
   CARE MANAGEMENT NOTE 12/11/2012  Patient:  Angela Blackburn, Angela Blackburn   Account Number:  [id number]  Date Initiated:  12/09/2012  Documentation initiated by:  Donn Pierini  Subjective/Objective Assessment:   Pt admitted with chest tightness- dizziness- Severe hyponatremia     Action/Plan:   PTA pt lived at home with spouse- independent- NCM to follow for d/c needs   Anticipated DC Date:  12/11/2012   Anticipated DC Plan:  HOME/SELF CARE      DC Planning Services  CM consult      Choice offered to / List presented to:             Status of service:  Completed, signed off Medicare Important Message given?   (If response is "NO", the following Medicare IM given date fields will be blank) Date Medicare IM given:   Date Additional Medicare IM given:    Discharge Disposition:    Per UR Regulation:  Reviewed for med. necessity/level of care/duration of stay  If discussed at Long Length of Stay Meetings, dates discussed:    Comments:  12/11/12 10:00 MD ordered Eliquis for pt with the understanding the PCP will handle the pre-authoriation before pt's 30 day free trial card expires.  [facility] states [facility] have the med available in both 2.5mg  and 5mg  dosage. CM activated Eiquis 30 day free trial card for pt and explained to the pt to bring the card to the pharmacy and her PCP will get the pre-authorization for continued use at her followup.  No other CM needs were communicated at this time.  Freddy Jaksch, BSN, [place] [phone].  12/11/12 /08:31 CM received call from RN concerning cost of Eliquis.  [facility] [place] [facility] called for a test run.  Eliquis requires pre-authoriation so no cost could be ascertained.  Md mad aware of pre-authorization and Pharmacy suggested free30  trial card could be give used while pt's PCP worked on the pre-authorization.  Waiting on MD's order.  Freddy Jaksch, BSN, CM [phone].

## 2012-12-11 NOTE — Discharge Summary (Signed)
PATIENT DETAILS Name: Angela Blackburn Age: 79 y.o. Sex: female Date of Birth: [birth year] MRN: [id number]. Admit Date: 12/08/2012 Admitting Physician: Eduard Clos, MD WUJ:[id number], Babette Relic, MD  Recommendations for Outpatient Follow-up:  1.Patient has been given a  30 day card trial for Eliquis, she will need to followup with her primary care practitioner who will need to pre-authorize this medication. 2. Would avoid HCTZ in the future-likely hyponatremia was from HCTZ. 3. Please check a chemistry panel at next visit with PCP  PRIMARY DISCHARGE DIAGNOSIS:  Principal Problem:   Hyponatremia Active Problems:   HTN (hypertension)   Chest tightness   Dizziness   Hypokalemia   Atrial fibrillation     PAST MEDICAL HISTORY: Past Medical History  Diagnosis Date  . Hypertension   . Chest pain   . Hypothyroidism   . Hyperlipidemia   . Knee pain   . Peripheral neuropathy   . Fibromyalgia     DISCHARGE MEDICATIONS:   Medication List    STOP taking these medications       aspirin 81 MG tablet     hydrochlorothiazide 25 MG tablet  Commonly known as:  HYDRODIURIL     nitrofurantoin (macrocrystal-monohydrate) 100 MG capsule  Commonly known as:  MACROBID      TAKE these medications       acetaminophen 500 MG tablet  Commonly known as:  TYLENOL  Take 1,000 mg by mouth every 6 (six) hours as needed for pain.     amLODipine 10 MG tablet  Commonly known as:  NORVASC  Take 10 mg by mouth daily.     apixaban 5 MG Tabs tablet  Commonly known as:  ELIQUIS  Take 1 tablet (5 mg total) by mouth 2 (two) times daily.     calcium carbonate 600 MG Tabs tablet  Commonly known as:  OS-CAL  Take 600 mg by mouth daily with breakfast.     docusate sodium 100 MG capsule  Commonly known as:  COLACE  Take 100 mg by mouth 2 (two) times daily.     fish oil-omega-3 fatty acids 1000 MG capsule  Take 1 g by mouth daily.     furosemide 40 MG tablet  Commonly known as:  LASIX  Take 1  tablet (40 mg total) by mouth daily.     GLUCOSAMINE CHONDR 1500 COMPLX Caps  Take 1 capsule by mouth daily.     ICAPS Caps  Take 1 capsule by mouth daily.     levothyroxine 125 MCG tablet  Commonly known as:  SYNTHROID, LEVOTHROID  Take 125 mcg by mouth daily.     methylcellulose 1 % ophthalmic solution  Commonly known as:  ARTIFICIAL TEARS  Place 1 drop into both eyes as needed (for dry eyes).     nortriptyline 75 MG capsule  Commonly known as:  PAMELOR  Take 75 mg by mouth at bedtime.     potassium chloride 20 MEQ packet  Commonly known as:  KLOR-CON  Take 20 mEq by mouth daily.     PROAIR HFA 108 (90 BASE) MCG/ACT inhaler  Generic drug:  albuterol  Inhale 2 puffs into the lungs every 6 (six) hours as needed for shortness of breath.     psyllium 58.6 % packet  Commonly known as:  METAMUCIL  Take 1 packet by mouth daily.     tiotropium 18 MCG inhalation capsule  Commonly known as:  SPIRIVA  Place 18 mcg into inhaler and inhale daily.  Vitamin D 2000 UNITS tablet  Take 2,000 Units by mouth daily.        ALLERGIES:   Allergies  Allergen Reactions  . Crestor [Rosuvastatin Calcium] Other (See Comments)    myalgia  . Lipitor [Atorvastatin Calcium] Other (See Comments)  . Livalo [Pitavastatin Calcium] Other (See Comments)    myalgia  . Other Other (See Comments)    All CHOLESTEROL MEDS-cause myalgia  . Zetia [Ezetimibe] Other (See Comments)    myalgia    BRIEF HPI:  See H&P, Labs, Consult and Test reports for all details in brief, Angela Blackburn is a 79 y.o. female with history of hypertension and hypothyroidism who presented to the emergency room complaining of dizziness. She apparently was dizzy on trying to stand. She was found to have hyponatremia with a sodium of 117 on admission. She was then admitted for further evaluation and treatment.   CONSULTATIONS:   cardiology  PERTINENT RADIOLOGIC STUDIES: Dg Chest 2 View  12/08/2012   *RADIOLOGY  REPORT*  Clinical Data: Chest pain and dizziness.  CHEST - 2 VIEW  Comparison: None.  Findings: The lungs are well-aerated.  Mild vascular congestion is noted.  There is no evidence of focal opacification, pleural effusion or pneumothorax.  The heart is borderline normal in size; the mediastinal contour is within normal limits.  No acute osseous abnormalities are seen.  IMPRESSION: Mild vascular congestion noted; no acute cardiopulmonary process seen.   Original Report Authenticated By: Tonia Ghent, M.D.   Ct Head Wo Contrast  12/08/2012   *RADIOLOGY REPORT*  Clinical Data: Nausea, dizziness and shortness of breath; weakness and fatigue.  CT HEAD WITHOUT CONTRAST  Technique:  Contiguous axial images were obtained from the base of the skull through the vertex without contrast.  Comparison: None.  Findings: There is no evidence of acute infarction, mass lesion, or intra- or extra-axial hemorrhage on CT.  Prominence of the sulci suggests mild cortical volume loss. Diffuse periventricular and subcortical white matter change likely reflects small vessel ischemic microangiopathy.  The brainstem and fourth ventricle are within normal limits.  The basal ganglia are unremarkable in appearance.  The cerebral hemispheres demonstrate grossly normal gray-white differentiation. No mass effect or midline shift is seen.  There is no evidence of fracture; visualized osseous structures are unremarkable in appearance.  The visualized portions of the orbits are within normal limits.  The paranasal sinuses and mastoid air cells are well-aerated.  A 1.2 cm subcutaneous nodule at the vertex is likely benign in nature.  IMPRESSION:  1.  No acute intracranial pathology seen on CT. 2.  Mild cortical volume loss and diffuse small vessel ischemic microangiopathy. 3.  1.2 cm subcutaneous nodule at the vertex is likely benign in nature.   Original Report Authenticated By: Tonia Ghent, M.D.     PERTINENT LAB RESULTS: CBC:  Recent  Labs  12/08/12 1949 12/09/12 0612  WBC 8.2 5.5  HGB 12.4 11.4*  HCT 34.3* 30.9*  PLT 274 236   CMET CMP     Component Value Date/Time   NA 135 12/11/2012 0500   K 3.6 12/11/2012 0500   CL 98 12/11/2012 0500   CO2 29 12/11/2012 0500   GLUCOSE 84 12/11/2012 0500   BUN 17 12/11/2012 0500   CREATININE 1.01 12/11/2012 0500   CALCIUM 8.2* 12/11/2012 0500   PROT 7.4 10/01/2011 0926   ALBUMIN 4.0 10/01/2011 0926   AST 22 10/01/2011 0926   ALT 17 10/01/2011 0926   ALKPHOS 67 10/01/2011 0926  BILITOT 0.9 10/01/2011 0926   GFRNONAA 52* 12/11/2012 0500   GFRAA 60* 12/11/2012 0500    GFR Estimated Creatinine Clearance: 49.8 ml/min (by C-G formula based on Cr of 1.01). No results found for this basename: LIPASE, AMYLASE,  in the last 72 hours  Recent Labs  12/09/12 0614 12/09/12 1246  TROPONINI <0.30 <0.30   No components found with this basename: POCBNP,  No results found for this basename: DDIMER,  in the last 72 hours No results found for this basename: HGBA1C,  in the last 72 hours No results found for this basename: CHOL, HDL, LDLCALC, TRIG, CHOLHDL, LDLDIRECT,  in the last 72 hours  Recent Labs  12/09/12 2105  TSH 4.340   No results found for this basename: VITAMINB12, FOLATE, FERRITIN, TIBC, IRON, RETICCTPCT,  in the last 72 hours Coags: No results found for this basename: PT, INR,  in the last 72 hours Microbiology: Recent Results (from the past 240 hour(s))  MRSA PCR SCREENING     Status: None   Collection Time    12/08/12 11:54 PM      Result Value Range Status   MRSA by PCR NEGATIVE  NEGATIVE Final   Comment:            The GeneXpert MRSA Assay (FDA     approved for NASAL specimens     only), is one component of a     comprehensive MRSA colonization     surveillance program. It is not     intended to diagnose MRSA     infection nor to guide or     monitor treatment for     MRSA infections.  URINE CULTURE     Status: None   Collection Time    12/09/12  12:25 AM      Result Value Range Status   Specimen Description URINE, CATHETERIZED   Final   Special Requests CX ADDED AT 0141 ON 161096   Final   Culture  Setup Time     Final   Value: 12/09/2012 02:22     Performed at [facility]   Colony Count     Final   Value: >=100,000 COLONIES/ML     Performed at [facility]   Culture     Final   Value: ESCHERICHIA COLI     Performed at [facility]   Report Status 12/10/2012 FINAL   Final   Organism ID, Bacteria ESCHERICHIA COLI   Final     BRIEF HOSPITAL COURSE:   Principal Problem:   Hyponatremia - Etiology not clear-suspected to be either secondary to HCTZ or SIADH. Patient was given 2 L of fluids in the emergency room, following that she was placed on fluid restriction and started on Lasix. Sodium slowly increased, by day of discharge it was up to 135. - Would avoid HCTZ in the future, will continue with Lasix 40 mg daily on discharge. - She will need a chemistry panel checked when she follows up with her primary care practitioner.  Active Problems: Atrial fibrillation  - This is rate controlled- without the use of any rate controlling medications. - Seen by cardiology, recommended to start Eliquis. We have stopped aspirin for now. - She has been seen by the social worker/case worker today, she does require a pre-authorization from her insurance for Eliquis, unfortunately this cannot be done over the weekend, as a result we have provided her a 30 day card for Eliquis, I have asked her to  go to her primary care practitioner next week, and have him preauthorize Eliquis - He also will need to followup with her primary cardiologist-Dr. Elease Hashimoto in the next 2 weeks.    HTN (hypertension) - Controlled with Lasix and amlodipine. Further optimization can be done in the outpatient setting.    Chest tightness - Felt to be atypical, troponins were negative. Cardiology was consulted and does not feel that this was cardiac  related.    Dizziness - Suspect this is either secondary to orthostatic hypotension or form hyponatremia. This is resolved. Patient is ambulating independently in the room and in the hallway.    Hypokalemia - Suspect this is secondary to HCTZ, she is to continue with KCl supplementation on discharge. She will need electrolyte panel checked when she follows up with her primary care practitioner.   UTI - Started on Rocephin and then changed to Cipro after IV access lost. This was felt to be a colonization rather than a true infection, as a result all antibiotics were discontinued.  Hypothyroidism:  TSH normal  TODAY-DAY OF DISCHARGE:  Subjective:   Angela Blackburn today has no headache,no chest abdominal pain,no new weakness tingling or numbness, feels much better wants to go home today.   Objective:   Blood pressure 113/51, pulse 69, temperature 98.3 F (36.8 C), temperature source Oral, resp. rate 18, height 5\' 6"  (1.676 m), weight 82.918 kg (182 lb 12.8 oz), SpO2 95.00%.  Intake/Output Summary (Last 24 hours) at 12/11/12 0944 Last data filed at 12/10/12 1800  Gross per 24 hour  Intake    600 ml  Output      0 ml  Net    600 ml   Filed Weights   12/08/12 2350 12/10/12 0500 12/11/12 0600  Weight: 85.4 kg (188 lb 4.4 oz) 84.913 kg (187 lb 3.2 oz) 82.918 kg (182 lb 12.8 oz)    Exam Awake Alert, Oriented *3, No new F.N deficits, Normal affect [place].AT,PERRAL Supple Neck,No JVD, No cervical lymphadenopathy appriciated.  Symmetrical Chest wall movement, Good air movement bilaterally, CTAB RRR,No Gallops,Rubs or new Murmurs, No Parasternal Heave +ve B.Sounds, Abd Soft, Non tender, No organomegaly appriciated, No rebound -guarding or rigidity. No Cyanosis, Clubbing or edema, No new Rash or bruise  DISCHARGE CONDITION: Stable  DISPOSITION: Home  DISCHARGE INSTRUCTIONS:    Activity:  As tolerated   Diet recommendation: Heart Healthy diet      Discharge Orders   Future  Appointments Provider Department Dept Phone   01/17/2013 11:15 AM [facility] [place] Office [phone]   01/17/2013 11:30 AM Vesta Mixer, MD [facility] [phone]   Future Orders Complete By Expires   Call MD for:  difficulty breathing, headache or visual disturbances  As directed    Call MD for:  persistant dizziness or light-headedness  As directed    Diet - low sodium heart healthy  As directed    Increase activity slowly  As directed       Follow-up Information   Follow up with BADGER,MICHAEL C, MD. Schedule an appointment as soon as possible for a visit in 1 week.   Specialty:  Family Medicine   Contact information:   [address] [place] [address] [phone]       Follow up with Elyn Aquas., MD. Schedule an appointment as soon as possible for a visit in 2 weeks.   Specialty:  Cardiology   Contact information:   [address]  [address] [place] [address] [phone]       Total Time spent on discharge equals 45 minutes.  SignedJeoffrey Massed 12/11/2012 9:44 AM

## 2012-12-11 NOTE — Plan of Care (Signed)
Problem: Discharge Progression Outcomes Goal: Complications resolved/controlled Outcome: Completed/Met Date Met:  12/11/12 Remains in afib rate controlled. To be discharged on eliquis. Has seen the afib video and received written info and rec. Book. Denies questions at this time

## 2012-12-11 NOTE — Progress Notes (Signed)
   CARE MANAGEMENT NOTE 12/11/2012  Patient:  Angela Blackburn, Angela Blackburn   Account Number:  [id number]  Date Initiated:  12/09/2012  Documentation initiated by:  Donn Pierini  Subjective/Objective Assessment:   Pt admitted with chest tightness- dizziness- Severe hyponatremia     Action/Plan:   PTA pt lived at home with spouse- independent- NCM to follow for d/c needs   Anticipated DC Date:  12/11/2012   Anticipated DC Plan:  HOME/SELF CARE      DC Planning Services  CM consult      Choice offered to / List presented to:             Status of service:  In process, will continue to follow Medicare Important Message given?   (If response is "NO", the following Medicare IM given date fields will be blank) Date Medicare IM given:   Date Additional Medicare IM given:    Discharge Disposition:    Per UR Regulation:  Reviewed for med. necessity/level of care/duration of stay  If discussed at Long Length of Stay Meetings, dates discussed:    Comments:  12/11/12 /08:31 CM received call from RN concerning cost of Eliquis.  [facility] in [place] was called for a test run.  Eliquis requires pre-authoriation so no cost could be ascertained.  Md mad aware of pre-authorization and Pharmacy suggested free30  trial card could be give used while pt's PCP worked on the pre-authorization.  Waiting on MD's order.  Freddy Jaksch, BSN, CM [phone].

## 2012-12-13 ENCOUNTER — Telehealth: Payer: Self-pay | Admitting: Cardiovascular Disease

## 2012-12-13 NOTE — Telephone Encounter (Signed)
Verified potassium dose with patient, reviewed normal potassium level in hospital, take one 20 meq daily, patient states she understands.

## 2012-12-13 NOTE — Telephone Encounter (Signed)
Please call pt regarding her potassium tablet being changed to once daily, please review and advise. Pt was made a f/u with PA Alben Spittle.

## 2012-12-13 NOTE — Telephone Encounter (Signed)
New problem:  Pt called in to schedule a post hosp visit. Pt states she wants something this week or the next. I informed the pt that Dr. Elease Hashimoto does not have an available appts within the next two weeks. I suggested we schedule the pt with a PA or NP... Pt refused. PT wants the nurse to work her into Dr. Harvie Bridge schedule sooner. Please advise

## 2012-12-19 ENCOUNTER — Emergency Department (HOSPITAL_COMMUNITY): Payer: Medicare Other

## 2012-12-19 ENCOUNTER — Emergency Department (HOSPITAL_COMMUNITY)
Admission: EM | Admit: 2012-12-19 | Discharge: 2012-12-19 | Disposition: A | Discharge status: Home / Self Care | Payer: Medicare Other | Attending: Emergency Medicine | Admitting: Emergency Medicine

## 2012-12-19 ENCOUNTER — Encounter (HOSPITAL_COMMUNITY): Payer: Self-pay | Admitting: Emergency Medicine

## 2012-12-19 DIAGNOSIS — E785 Hyperlipidemia, unspecified: Secondary | ICD-10-CM | POA: Insufficient documentation

## 2012-12-19 DIAGNOSIS — I10 Essential (primary) hypertension: Secondary | ICD-10-CM | POA: Insufficient documentation

## 2012-12-19 DIAGNOSIS — E039 Hypothyroidism, unspecified: Secondary | ICD-10-CM | POA: Insufficient documentation

## 2012-12-19 DIAGNOSIS — Z7901 Long term (current) use of anticoagulants: Secondary | ICD-10-CM | POA: Insufficient documentation

## 2012-12-19 DIAGNOSIS — G609 Hereditary and idiopathic neuropathy, unspecified: Secondary | ICD-10-CM | POA: Insufficient documentation

## 2012-12-19 DIAGNOSIS — Z79899 Other long term (current) drug therapy: Secondary | ICD-10-CM | POA: Insufficient documentation

## 2012-12-19 DIAGNOSIS — IMO0001 Reserved for inherently not codable concepts without codable children: Secondary | ICD-10-CM | POA: Insufficient documentation

## 2012-12-19 DIAGNOSIS — R079 Chest pain, unspecified: Secondary | ICD-10-CM | POA: Insufficient documentation

## 2012-12-19 DIAGNOSIS — E876 Hypokalemia: Secondary | ICD-10-CM

## 2012-12-19 DIAGNOSIS — Z888 Allergy status to other drugs, medicaments and biological substances status: Secondary | ICD-10-CM | POA: Insufficient documentation

## 2012-12-19 DIAGNOSIS — I4891 Unspecified atrial fibrillation: Secondary | ICD-10-CM | POA: Insufficient documentation

## 2012-12-19 LAB — POCT I-STAT, CHEM 8
BUN: 16 mg/dL (ref 6–23)
Creatinine, Ser: 1.2 mg/dL — ABNORMAL HIGH (ref 0.50–1.10)
HCT: 39 % (ref 36.0–46.0)
Hemoglobin: 13.3 g/dL (ref 12.0–15.0)
Potassium: 3 mEq/L — ABNORMAL LOW (ref 3.5–5.1)
Sodium: 139 mEq/L (ref 135–145)
TCO2: 25 mmol/L (ref 0–100)

## 2012-12-19 LAB — CBC WITH DIFFERENTIAL/PLATELET
Basophils Absolute: 0 10*3/uL (ref 0.0–0.1)
Basophils Relative: 0 % (ref 0–1)
Eosinophils Relative: 3 % (ref 0–5)
Lymphocytes Relative: 30 % (ref 12–46)
Lymphs Abs: 1.7 10*3/uL (ref 0.7–4.0)
MCV: 93.1 fL (ref 78.0–100.0)
Neutrophils Relative %: 64 % (ref 43–77)
Platelets: 285 10*3/uL (ref 150–400)
RBC: 3.79 MIL/uL — ABNORMAL LOW (ref 3.87–5.11)
RDW: 12.6 % (ref 11.5–15.5)
WBC: 5.7 10*3/uL (ref 4.0–10.5)

## 2012-12-19 LAB — POCT I-STAT TROPONIN I: Troponin i, poc: 0 ng/mL (ref 0.00–0.08)

## 2012-12-19 MED ORDER — POTASSIUM CHLORIDE CRYS ER 20 MEQ PO TBCR: 20.0000 meq | EXTENDED_RELEASE_TABLET | Freq: Two times a day (BID) | ORAL | Status: DC

## 2012-12-19 NOTE — ED Notes (Signed)
Patient transported to X-ray 

## 2012-12-19 NOTE — ED Notes (Signed)
Pt to ED via [facility], pt from home, pt reports waking at 0400 with pain to her gums radiating to her bilateral shoulder then across her chest that has now subsided, pt now reports pain to her shoulders only, pt reports hx of the same, pt recently here and dx with new onset A-fib, pt given 1 nitro sl en route, no ASA given per daughter's request since pt just started Coumadin last week. 20 G LAC, VS BP 155/88, HR 68 irregular, CBG 100, pt's BP decreased to 116/77 after nitro

## 2012-12-19 NOTE — ED Provider Notes (Signed)
CSN: [id number]     Arrival date & time 12/19/12  0848 History   First MD Initiated Contact with Patient 12/19/12 [phone]     Chief Complaint  Patient presents with  . Chest Pain   (Consider location/radiation/quality/duration/timing/severity/associated sxs/prior Treatment) Patient is a 79 y.o. female presenting with chest pain.  Chest Pain Associated symptoms: no abdominal pain, no back pain, no headache, no nausea, no numbness, no shortness of breath, not vomiting and no weakness    patient presents with an episode of chest pain today. It began in her jaws without her chest into her shoulder blades. He states this is her typical pain. It is improved somewhat on its own. She's been seen for this previously by Dr. Elease Hashimoto. He was thought to be noncardiac at that time. She's had negative stress test. She was recently diagnosed with atrial fibrillation started out. No difficulty breathing. No nausea vomiting. Reviewed. No lightheadedness. The episodes lasted around 20 minutes. Her pain is improved chest distal site she has pain in her shoulders.  Past Medical History  Diagnosis Date  . Hypertension   . Chest pain   . Hypothyroidism   . Hyperlipidemia   . Knee pain   . Peripheral neuropathy   . Fibromyalgia    Past Surgical History  Procedure Laterality Date  . US echocardiography  09/14/2007    EF 55-60%  . Cardiovascular stress test  08/29/2009    EF 86%   Family History  Problem Relation Age of Onset  . Heart failure Mother    History  Substance Use Topics  . Smoking status: Never Smoker   . Smokeless tobacco: Not on file  . Alcohol Use: No   OB History   Grav Para Term Preterm Abortions TAB SAB Ect Mult Living                 Review of Systems  Constitutional: Negative for activity change and appetite change.  Eyes: Negative for pain.  Respiratory: Negative for chest tightness and shortness of breath.   Cardiovascular: Positive for chest pain. Negative for leg swelling.   Gastrointestinal: Negative for nausea, vomiting, abdominal pain and diarrhea.  Genitourinary: Negative for flank pain.  Musculoskeletal: Negative for back pain and neck stiffness.  Skin: Negative for rash.  Neurological: Negative for weakness, numbness and headaches.  Psychiatric/Behavioral: Negative for behavioral problems.    Allergies  Crestor; Lipitor; Livalo; Other; and Zetia  Home Medications   Current Outpatient Rx  Name  Route  Sig  Dispense  Refill  . acetaminophen (TYLENOL) 500 MG tablet   Oral   Take 1,000 mg by mouth every 6 (six) hours as needed for pain.         [name] albuterol (PROAIR HFA) 108 (90 BASE) MCG/ACT inhaler   Inhalation   Inhale 2 puffs into the lungs every 6 (six) hours as needed for shortness of breath.          [name] amLODipine (NORVASC) 10 MG tablet   Oral   Take 10 mg by mouth daily.          [name] apixaban (ELIQUIS) 5 MG TABS tablet   Oral   Take 1 tablet (5 mg total) by mouth 2 (two) times daily.   60 tablet   0   . calcium carbonate (OS-CAL) 600 MG TABS tablet   Oral   Take 600 mg by mouth daily with breakfast.         . Cholecalciferol (VITAMIN D) 2000 UNITS  tablet   Oral   Take 2,000 Units by mouth daily.         [name] docusate sodium (COLACE) 100 MG capsule   Oral   Take 100 mg by mouth 2 (two) times daily.         . fish oil-omega-3 fatty acids 1000 MG capsule   Oral   Take 1 g by mouth daily.         . furosemide (LASIX) 40 MG tablet   Oral   Take 1 tablet (40 mg total) by mouth daily.   30 tablet   0   . Glucosamine-Chondroit-Vit C-Mn (GLUCOSAMINE CHONDR 1500 COMPLX) CAPS   Oral   Take 1 capsule by mouth daily.         [name] levothyroxine (SYNTHROID, LEVOTHROID) 125 MCG tablet   Oral   Take 125 mcg by mouth daily.           . methylcellulose (ARTIFICIAL TEARS) 1 % ophthalmic solution   Both Eyes   Place 1 drop into both eyes as needed (for dry eyes).         . Multiple Vitamins-Minerals (ICAPS) CAPS    Oral   Take 1 capsule by mouth daily.         . nortriptyline (PAMELOR) 75 MG capsule   Oral   Take 75 mg by mouth at bedtime.           . psyllium (METAMUCIL) 58.6 % packet   Oral   Take 1 packet by mouth daily as needed (constipation).          [name] tiotropium (SPIRIVA) 18 MCG inhalation capsule   Inhalation   Place 18 mcg into inhaler and inhale daily.         . potassium chloride SA (K-DUR,KLOR-CON) 20 MEQ tablet   Oral   Take 1 tablet (20 mEq total) by mouth 2 (two) times daily.   30 tablet   0    BP 125/61  Pulse 79  Temp(Src) 97.7 F (36.5 C) (Oral)  Resp 14  SpO2 99% Physical Exam  Nursing note and vitals reviewed. Constitutional: She is oriented to person, place, and time. She appears well-developed and well-nourished.  HENT:  Head: Normocephalic and atraumatic.  Eyes: EOM are normal. Pupils are equal, round, and reactive to light.  Neck: Normal range of motion. Neck supple.  Cardiovascular: Normal rate and normal heart sounds.   No murmur heard. Mildly irregular rhythm  Pulmonary/Chest: Effort normal and breath sounds normal. No respiratory distress. She has no wheezes. She has no rales.  Abdominal: Soft. Bowel sounds are normal. She exhibits no distension. There is no tenderness. There is no rebound and no guarding.  Musculoskeletal: Normal range of motion.  Neurological: She is alert and oriented to person, place, and time. No cranial nerve deficit.  Skin: Skin is warm and dry.  Psychiatric: She has a normal mood and affect. Her speech is normal.    ED Course  Procedures (including critical care time) Labs Review Labs Reviewed  CBC WITH DIFFERENTIAL - Abnormal; Notable for the following:    RBC 3.79 (*)    HCT 35.3 (*)    All other components within normal limits  POCT I-STAT, CHEM 8 - Abnormal; Notable for the following:    Potassium 3.0 (*)    Creatinine, Ser 1.20 (*)    All other components within normal limits  POCT I-STAT TROPONIN I    Imaging Review Dg Chest 2 View  12/19/2012  CLINICAL DATA:  Chest pain.  EXAM: CHEST  2 VIEW  COMPARISON:  12/08/2012.  FINDINGS: The cardiac silhouette, mediastinal and hilar contours are within normal limits and stable. There is tortuosity and calcification of the thoracic aorta. There are chronic emphysematous and bronchitic lung changes but no acute pulmonary findings. Stable eventration of the right hemidiaphragm. The bony thorax is intact.  IMPRESSION: Chronic emphysematous changes but no acute pulmonary findings.   Electronically Signed   By: Loralie Champagne M.D.   On: 12/19/2012 10:08    EKG Interpretation   None       Date: 12/19/2012  Rate: 79  Rhythm: atrial fibrillation  QRS Axis: normal  Intervals: afib  ST/T Wave abnormalities: nonspecific ST/T changes  Conduction Disutrbances:none  Narrative Interpretation:   Old EKG Reviewed: unchanged   MDM   1. Chest pain   2. Hypokalemia    Patient with chest pain. Previous history of same. Has previously been worked up by cardiology who considered noncardiac. Enzymes negative x1. Has mild hypokalemia. Has had a history of same. Potassium has recently been decreased also. Will increase and will follow up on Friday as planned.    Juliet Rude. Rubin Payor, MD 12/19/12 1128

## 2012-12-24 ENCOUNTER — Encounter: Payer: Self-pay | Admitting: Physician Assistant

## 2012-12-24 ENCOUNTER — Other Ambulatory Visit: Payer: Self-pay | Admitting: *Deleted

## 2012-12-24 ENCOUNTER — Ambulatory Visit (INDEPENDENT_AMBULATORY_CARE_PROVIDER_SITE_OTHER): Payer: Medicare Other | Admitting: Physician Assistant

## 2012-12-24 ENCOUNTER — Encounter: Payer: Self-pay | Admitting: *Deleted

## 2012-12-24 VITALS — BP 135/72 | HR 79 | Ht 66.0 in | Wt 170.0 lb

## 2012-12-24 DIAGNOSIS — I4891 Unspecified atrial fibrillation: Secondary | ICD-10-CM

## 2012-12-24 DIAGNOSIS — E876 Hypokalemia: Secondary | ICD-10-CM

## 2012-12-24 DIAGNOSIS — I10 Essential (primary) hypertension: Secondary | ICD-10-CM

## 2012-12-24 DIAGNOSIS — R0789 Other chest pain: Secondary | ICD-10-CM

## 2012-12-24 DIAGNOSIS — E871 Hypo-osmolality and hyponatremia: Secondary | ICD-10-CM

## 2012-12-24 LAB — BASIC METABOLIC PANEL
BUN: 22 mg/dL (ref 6–23)
Calcium: 9.6 mg/dL (ref 8.4–10.5)
Chloride: 102 mEq/L (ref 96–112)
Creatinine, Ser: 1.3 mg/dL — ABNORMAL HIGH (ref 0.4–1.2)
Sodium: 140 mEq/L (ref 135–145)

## 2012-12-24 MED ORDER — FUROSEMIDE 40 MG PO TABS: 40.0000 mg | ORAL_TABLET | Freq: Every day | ORAL | Status: DC

## 2012-12-24 NOTE — Progress Notes (Signed)
[address] [place], [place]  [address] Phone: [phone] Fax:  [phone]  Date:  12/24/2012   ID:  Angela Blackburn, DOB 1932-06-18, MRN [id number]  PCP:  Herb Grays, MD  Cardiologist:  Dr. Delane Ginger     History of Present Illness: Angela Blackburn is a 79 y.o. female who returns for follow up after a recent admission to the hospital.   She has a history of HTN, HL, hypothyroidism, chronic chest pain.  LHC (10/1999): Normal coronary arteries.  Myoview (10/13): No ischemia, EF 82%, normal study.    She was admitted 10/8-10/11 after presenting to the hospital with complaints of dizziness. She was found to be hyponatremic with a sodium of 117. She also complained of chest pain and was noted to be in atrial fibrillation.  Heart rate was controlled. She was seen by cardiology.  Echo (12/10/12): EF 60-65%, mild MR, mild RAE, PASP 42.  CHADS2-VASc=4.  She was placed on Eliquis.   She was taken off of HCTZ because of hyponatremia. She was kept on Lasix at discharge. She has a history of peripheral edema.  She was also treated for a UTI.  Cardiac enzymes remained negative. No further workup was recommended for chest pain. Of note, she did present back to the emergency room 12/19/12. Cardiac markers remained negative. Potassium was low and replaced. No further workup was initiated.  She continues to note occasional bilateral shoulder pain.  No exertional symptoms.  No chest pain.  She has dyspnea with more mod to extreme exertion.  She is NYHA Class II-IIb.  No orthopnea, PND.  LE edema is improved.  No syncope.  She notes fatigue.   Labs (7/13):    HDL 73, LDL 122,  Labs (10/14):  K 3, Cr 1.2, proBNP 873, Hgb 13.3, TSH 4.34,    Wt Readings from Last 3 Encounters:  12/24/12 170 lb (77.111 kg)  12/11/12 182 lb 12.8 oz (82.918 kg)  06/02/12 170 lb 1.9 oz (77.166 kg)     Past Medical History  Diagnosis Date  . Hypertension   . Chest pain   . Hypothyroidism   . Hyperlipidemia     . Knee pain   . Peripheral neuropathy   . Fibromyalgia     Current Outpatient Prescriptions  Medication Sig Dispense Refill  . acetaminophen (TYLENOL) 500 MG tablet Take 1,000 mg by mouth every 6 (six) hours as needed for pain.      [name] albuterol (PROAIR HFA) 108 (90 BASE) MCG/ACT inhaler Inhale 2 puffs into the lungs every 6 (six) hours as needed for shortness of breath.       [name] amLODipine (NORVASC) 10 MG tablet Take 10 mg by mouth daily.       [name] apixaban (ELIQUIS) 5 MG TABS tablet Take 1 tablet (5 mg total) by mouth 2 (two) times daily.  60 tablet  0  . calcium carbonate (OS-CAL) 600 MG TABS tablet Take 600 mg by mouth daily with breakfast.      . Cholecalciferol (VITAMIN D) 2000 UNITS tablet Take 2,000 Units by mouth daily.      [name] docusate sodium (COLACE) 100 MG capsule Take 100 mg by mouth 2 (two) times daily.      . fish oil-omega-3 fatty acids 1000 MG capsule Take 1 g by mouth daily.      . furosemide (LASIX) 40 MG tablet Take 1 tablet (40 mg total) by mouth daily.  30 tablet  0  .  Glucosamine-Chondroit-Vit C-Mn (GLUCOSAMINE CHONDR 1500 COMPLX) CAPS Take 1 capsule by mouth daily.      [name] levothyroxine (SYNTHROID, LEVOTHROID) 125 MCG tablet Take 125 mcg by mouth daily.        . methylcellulose (ARTIFICIAL TEARS) 1 % ophthalmic solution Place 1 drop into both eyes as needed (for dry eyes).      . Multiple Vitamins-Minerals (ICAPS) CAPS Take 1 capsule by mouth daily.      . nortriptyline (PAMELOR) 75 MG capsule Take 75 mg by mouth at bedtime.        . potassium chloride SA (K-DUR,KLOR-CON) 20 MEQ tablet Take 1 tablet (20 mEq total) by mouth 2 (two) times daily.  30 tablet  0  . psyllium (METAMUCIL) 58.6 % packet Take 1 packet by mouth daily as needed (constipation).       [name] tiotropium (SPIRIVA) 18 MCG inhalation capsule Place 18 mcg into inhaler and inhale daily.       No current facility-administered medications for this visit.    Allergies:    Allergies  Allergen Reactions  .  Crestor [Rosuvastatin Calcium] Other (See Comments)    myalgia  . Lipitor [Atorvastatin Calcium] Other (See Comments)    myalgia  . Livalo [Pitavastatin Calcium] Other (See Comments)    myalgia  . Other Other (See Comments)    All CHOLESTEROL MEDS-cause myalgia  . Zetia [Ezetimibe] Other (See Comments)    myalgia    Social History:  The patient  reports that she has never smoked. She does not have any smokeless tobacco history on file. She reports that she does not drink alcohol or use illicit drugs.   Family History:  The patient's family history includes Heart failure in her mother.   ROS:  Please see the history of present illness.   She has a non-productive cough.   All other systems reviewed and negative.   PHYSICAL EXAM: VS:  BP 135/72  Pulse 79  Ht 5\' 6"  (1.676 m)  Wt 170 lb (77.111 kg)  BMI 27.45 kg/m2 Well nourished, well developed, in no acute distress HEENT: normal Neck: no JVD Cardiac:  normal S1, S2; irregularly irregular rhythm; no murmur Lungs:  clear to auscultation bilaterally, no wheezing, rhonchi or rales Abd: soft, nontender, no hepatomegaly Ext: trace-1+ bilateral ankle edema Skin: warm and dry Neuro:  CNs 2-12 intact, no focal abnormalities noted  EKG:  Atrial fibrillation, HR 79, normal axis, diffuse ST changes, no change from prior tracing     ASSESSMENT AND PLAN:  1. Atrial Fibrillation:  I had a long d/w the patient regarding rate vs rhythm control.  She notes a symptom of fatigue that is fairly new and she would like to pursue DCCV.  She has been on Eliquis uninterrupted since d/c from the hospital.  We discussed the importance of continuing this without interruption in order to undergo elective DCCV.  I discussed her case with Dr. Delane Ginger who agreed.  I will arrange DCCV in the next 2 weeks.  Arrange f/u in risk reduction clinic for f/u after initiation of NOAC.   2. Chest Pain:  No recurrence.  No further workup needed.  3. Hypertension:   Controlled.  4. Hyponatremia:  Repeat BMET. 5. Hypokalemia:  Repeat BMET. 6. Disposition:  F/u with Dr. Delane Ginger after DCCV.  Signed, Tereso Newcomer, PA-C  12/24/2012 11:51 AM

## 2012-12-24 NOTE — H&P (Signed)
History and Physical  Date:  12/24/2012   ID:  Angela Blackburn, DOB [birth year], MRN [id number]  PCP:  Herb Grays, MD  Cardiologist:  Dr. Delane Ginger     History of Present Illness: Angela Blackburn is a 79 y.o. female who returns for follow up after a recent admission to the hospital.   She has a history of HTN, HL, hypothyroidism, chronic chest pain.  LHC (10/1999): Normal coronary arteries.  Myoview (10/13): No ischemia, EF 82%, normal study.    She was admitted 10/8-10/11 after presenting to the hospital with complaints of dizziness. She was found to be hyponatremic with a sodium of 117. She also complained of chest pain and was noted to be in atrial fibrillation.  Heart rate was controlled. She was seen by cardiology.  Echo (12/10/12): EF 60-65%, mild MR, mild RAE, PASP 42.  CHADS2-VASc=4.  She was placed on Eliquis.   She was taken off of HCTZ because of hyponatremia. She was kept on Lasix at discharge. She has a history of peripheral edema.  She was also treated for a UTI.  Cardiac enzymes remained negative. No further workup was recommended for chest pain. Of note, she did present back to the emergency room 12/19/12. Cardiac markers remained negative. Potassium was low and replaced. No further workup was initiated.  She continues to note occasional bilateral shoulder pain.  No exertional symptoms.  No chest pain.  She has dyspnea with more mod to extreme exertion.  She is NYHA Class II-IIb.  No orthopnea, PND.  LE edema is improved.  No syncope.  She notes fatigue.   Labs (7/13):    HDL 73, LDL 122,  Labs (10/14):  K 3, Cr 1.2, proBNP 873, Hgb 13.3, TSH 4.34,    Wt Readings from Last 3 Encounters:  12/24/12 170 lb (77.111 kg)  12/11/12 182 lb 12.8 oz (82.918 kg)  06/02/12 170 lb 1.9 oz (77.166 kg)     Past Medical History  Diagnosis Date  . Hypertension   . Chest pain   . Hypothyroidism   . Hyperlipidemia   . Knee pain   . Peripheral neuropathy   . Fibromyalgia     Current  Outpatient Prescriptions  Medication Sig Dispense Refill  . acetaminophen (TYLENOL) 500 MG tablet Take 1,000 mg by mouth every 6 (six) hours as needed for pain.      [name] albuterol (PROAIR HFA) 108 (90 BASE) MCG/ACT inhaler Inhale 2 puffs into the lungs every 6 (six) hours as needed for shortness of breath.       [name] amLODipine (NORVASC) 10 MG tablet Take 10 mg by mouth daily.       [name] apixaban (ELIQUIS) 5 MG TABS tablet Take 1 tablet (5 mg total) by mouth 2 (two) times daily.  60 tablet  0  . calcium carbonate (OS-CAL) 600 MG TABS tablet Take 600 mg by mouth daily with breakfast.      . Cholecalciferol (VITAMIN D) 2000 UNITS tablet Take 2,000 Units by mouth daily.      [name] docusate sodium (COLACE) 100 MG capsule Take 100 mg by mouth 2 (two) times daily.      . fish oil-omega-3 fatty acids 1000 MG capsule Take 1 g by mouth daily.      . furosemide (LASIX) 40 MG tablet Take 1 tablet (40 mg total) by mouth daily.  30 tablet  0  . Glucosamine-Chondroit-Vit C-Mn (GLUCOSAMINE CHONDR 1500 COMPLX) CAPS Take 1 capsule by mouth daily.      [name]  levothyroxine (SYNTHROID, LEVOTHROID) 125 MCG tablet Take 125 mcg by mouth daily.        . methylcellulose (ARTIFICIAL TEARS) 1 % ophthalmic solution Place 1 drop into both eyes as needed (for dry eyes).      . Multiple Vitamins-Minerals (ICAPS) CAPS Take 1 capsule by mouth daily.      . nortriptyline (PAMELOR) 75 MG capsule Take 75 mg by mouth at bedtime.        . potassium chloride SA (K-DUR,KLOR-CON) 20 MEQ tablet Take 1 tablet (20 mEq total) by mouth 2 (two) times daily.  30 tablet  0  . psyllium (METAMUCIL) 58.6 % packet Take 1 packet by mouth daily as needed (constipation).       [name] tiotropium (SPIRIVA) 18 MCG inhalation capsule Place 18 mcg into inhaler and inhale daily.       No current facility-administered medications for this visit.    Allergies:    Allergies  Allergen Reactions  . Crestor [Rosuvastatin Calcium] Other (See Comments)    myalgia  . Lipitor  [Atorvastatin Calcium] Other (See Comments)    myalgia  . Livalo [Pitavastatin Calcium] Other (See Comments)    myalgia  . Other Other (See Comments)    All CHOLESTEROL MEDS-cause myalgia  . Zetia [Ezetimibe] Other (See Comments)    myalgia    Social History:  The patient  reports that she has never smoked. She does not have any smokeless tobacco history on file. She reports that she does not drink alcohol or use illicit drugs.   Family History:  The patient's family history includes Heart failure in her mother.   ROS:  Please see the history of present illness.   She has a non-productive cough.   All other systems reviewed and negative.   PHYSICAL EXAM: VS:  BP 135/72  Pulse 79  Ht 5\' 6"  (1.676 m)  Wt 170 lb (77.111 kg)  BMI 27.45 kg/m2 Well nourished, well developed, in no acute distress HEENT: normal Neck: no JVD Cardiac:  normal S1, S2; irregularly irregular rhythm; no murmur Lungs:  clear to auscultation bilaterally, no wheezing, rhonchi or rales Abd: soft, nontender, no hepatomegaly Ext: trace-1+ bilateral ankle edema Skin: warm and dry Neuro:  CNs 2-12 intact, no focal abnormalities noted  EKG:  Atrial fibrillation, HR 79, normal axis, diffuse ST changes, no change from prior tracing     ASSESSMENT AND PLAN:  1. Atrial Fibrillation:  I had a long d/w the patient regarding rate vs rhythm control.  She notes a symptom of fatigue that is fairly new and she would like to pursue DCCV.  She has been on Eliquis uninterrupted since d/c from the hospital.  We discussed the importance of continuing this without interruption in order to undergo elective DCCV.  I discussed her case with Dr. Delane Ginger who agreed.  I will arrange DCCV in the next 2 weeks.  Arrange f/u in risk reduction clinic for f/u after initiation of NOAC.   2. Chest Pain:  No recurrence.  No further workup needed.  3. Hypertension:  Controlled.  4. Hyponatremia:  Repeat BMET. 5. Hypokalemia:  Repeat  BMET. 6. Disposition:  F/u with Dr. Delane Ginger after DCCV.  Signed, Tereso Newcomer, PA-C  12/24/2012 11:51 AM      Attending Note:   The patient was seen and examined.  Agree with assessment and plan as noted above.  Changes made to the above note as needed.  I agree with cardioversion.  Vesta Mixer,  Montez Hageman., MD, [facility] 12/24/2012, 3:15 PM

## 2012-12-24 NOTE — Patient Instructions (Addendum)
Your physician recommends that you continue on your current medications as directed. Please refer to the Current Medication list given to you today. I REFILLED YOUR LASIX TO [facility] DO NOT MISS ANY DOSES OF ELIQUIS   Your physician recommends that you HAVE lab work TODAY: BMET  Your physician recommends that you return for lab work ON November 7TH BETWEEN 8-5 BMET/CBC/PTT/PT/INR  Your physician has requested that you have a TEE/Cardioversion. DX: ATRIAL FIBRILLATION During a TEE, sound waves are used to create images of your heart. It provides your doctor with information about the size and shape of your heart and how well your heart's chambers and valves are working. In this test, a transducer is attached to the end of a flexible tube that is guided down you throat and into your esophagus (the tube leading from your mouth to your stomach) to get a more detailed image of your heart. Once the TEE has determined that a blood clot is not present, the cardioversion begins. Electrical Cardioversion uses a jolt of electricity to your heart either through paddles or wired patches attached to your chest. This is a controlled, usually prescheduled, procedure. This procedure is done at the hospital and you are not awake during the procedure. You usually go home the day of the procedure. Please see the instruction sheet given to you today for more information.   WANTS PT TO SCHEDULE APPOINTMENT TO COME IN TO COUMADIN PT IS ON ELIQUIS AND OVER 77 YEARS OF AGE

## 2012-12-27 ENCOUNTER — Telehealth: Payer: Self-pay | Admitting: *Deleted

## 2012-12-27 DIAGNOSIS — E876 Hypokalemia: Secondary | ICD-10-CM

## 2012-12-27 DIAGNOSIS — I4891 Unspecified atrial fibrillation: Secondary | ICD-10-CM

## 2012-12-27 MED ORDER — FUROSEMIDE 40 MG PO TABS: 20.0000 mg | ORAL_TABLET | Freq: Every day | ORAL | Status: DC

## 2012-12-27 MED ORDER — POTASSIUM CHLORIDE CRYS ER 20 MEQ PO TBCR: 20.0000 meq | EXTENDED_RELEASE_TABLET | Freq: Every day | ORAL | Status: DC

## 2012-12-27 NOTE — Telephone Encounter (Signed)
pt notified about both lab and echo results. Pt will decrease lasix to 20 mg daily; decrease K+ to 20 meq daily. Repeat lab on 01/07/13

## 2013-01-07 ENCOUNTER — Other Ambulatory Visit: Payer: Medicare Other

## 2013-01-07 ENCOUNTER — Other Ambulatory Visit (INDEPENDENT_AMBULATORY_CARE_PROVIDER_SITE_OTHER): Payer: Medicare Other

## 2013-01-07 ENCOUNTER — Ambulatory Visit (INDEPENDENT_AMBULATORY_CARE_PROVIDER_SITE_OTHER): Payer: Medicare Other | Admitting: Pharmacist

## 2013-01-07 DIAGNOSIS — E876 Hypokalemia: Secondary | ICD-10-CM

## 2013-01-07 DIAGNOSIS — I4891 Unspecified atrial fibrillation: Secondary | ICD-10-CM

## 2013-01-07 LAB — CBC WITH DIFFERENTIAL/PLATELET
Basophils Relative: 0.5 % (ref 0.0–3.0)
Eosinophils Relative: 2.3 % (ref 0.0–5.0)
HCT: 37.7 % (ref 36.0–46.0)
Hemoglobin: 12.5 g/dL (ref 12.0–15.0)
Lymphs Abs: 2.4 10*3/uL (ref 0.7–4.0)
MCV: 94 fl (ref 78.0–100.0)
Monocytes Relative: 6.5 % (ref 3.0–12.0)
Neutro Abs: 3.1 10*3/uL (ref 1.4–7.7)
RBC: 4.01 Mil/uL (ref 3.87–5.11)
RDW: 13.2 % (ref 11.5–14.6)
WBC: 6.1 10*3/uL (ref 4.5–10.5)

## 2013-01-07 LAB — BASIC METABOLIC PANEL
Calcium: 9.4 mg/dL (ref 8.4–10.5)
Chloride: 102 mEq/L (ref 96–112)
GFR: 51 mL/min — ABNORMAL LOW (ref >60.00)
Glucose, Bld: 92 mg/dL (ref 70–99)
Potassium: 4 mEq/L (ref 3.5–5.1)
Sodium: 138 mEq/L (ref 135–145)

## 2013-01-07 NOTE — Progress Notes (Signed)
Pt was started on Eliquis for Atrial fibrillation during hospitalization from 10/8-10/11.  Reviewed patients medication list.  Pt is not currently on any combined P-gp and strong CYP3A4 inhibitors/inducers (ketoconazole, traconazole, ritonavir, carbamazepine, phenytoin, rifampin, St. John's wort).  Reviewed labs.  SCr- 1.1, Weight 77kg, Age <80.  Dose appropriate based on CrCl.   Hgb and HCT Within Normal Limits  A full discussion of the nature of anticoagulants has been carried out.  A benefit/risk analysis has been presented to the patient, so that they understand the justification for choosing anticoagulation with Eliquis at this time.  The need for compliance is stressed.  Pt is aware to take the medication twice daily.  Side effects of potential bleeding are discussed, including unusual colored urine or stools, coughing up blood or coffee ground emesis, nose bleeds or serious fall or head trauma.  Discussed signs and symptoms of stroke. The patient should avoid any OTC items containing aspirin or ibuprofen.  Avoid alcohol consumption.   Call if any signs of abnormal bleeding.  Discussed financial obligations and resolved any difficulty in obtaining medication.  Next lab test test in 6 months.

## 2013-01-11 ENCOUNTER — Ambulatory Visit (HOSPITAL_COMMUNITY): Payer: Medicare Other | Admitting: Certified Registered"

## 2013-01-11 ENCOUNTER — Encounter (HOSPITAL_COMMUNITY): Payer: Self-pay | Admitting: *Deleted

## 2013-01-11 ENCOUNTER — Ambulatory Visit (HOSPITAL_COMMUNITY)
Admission: RE | Admit: 2013-01-11 | Discharge: 2013-01-11 | Disposition: A | Discharge status: Home / Self Care | Payer: Medicare Other | Source: Ambulatory Visit | Attending: Cardiology | Admitting: Cardiology

## 2013-01-11 ENCOUNTER — Encounter (HOSPITAL_COMMUNITY): Payer: Medicare Other | Admitting: Certified Registered"

## 2013-01-11 ENCOUNTER — Encounter (HOSPITAL_COMMUNITY)
Admission: RE | Disposition: A | Discharge status: Home / Self Care | Payer: Self-pay | Source: Ambulatory Visit | Attending: Cardiology

## 2013-01-11 DIAGNOSIS — I4891 Unspecified atrial fibrillation: Secondary | ICD-10-CM

## 2013-01-11 DIAGNOSIS — J449 Chronic obstructive pulmonary disease, unspecified: Secondary | ICD-10-CM | POA: Insufficient documentation

## 2013-01-11 DIAGNOSIS — I499 Cardiac arrhythmia, unspecified: Secondary | ICD-10-CM | POA: Insufficient documentation

## 2013-01-11 DIAGNOSIS — R0602 Shortness of breath: Secondary | ICD-10-CM | POA: Insufficient documentation

## 2013-01-11 DIAGNOSIS — E785 Hyperlipidemia, unspecified: Secondary | ICD-10-CM | POA: Insufficient documentation

## 2013-01-11 DIAGNOSIS — I10 Essential (primary) hypertension: Secondary | ICD-10-CM | POA: Insufficient documentation

## 2013-01-11 DIAGNOSIS — J4489 Other specified chronic obstructive pulmonary disease: Secondary | ICD-10-CM | POA: Insufficient documentation

## 2013-01-11 DIAGNOSIS — IMO0001 Reserved for inherently not codable concepts without codable children: Secondary | ICD-10-CM | POA: Insufficient documentation

## 2013-01-11 DIAGNOSIS — E039 Hypothyroidism, unspecified: Secondary | ICD-10-CM | POA: Insufficient documentation

## 2013-01-11 HISTORY — PX: CARDIOVERSION: SHX1299

## 2013-01-11 HISTORY — DX: Chronic obstructive pulmonary disease, unspecified: J44.9

## 2013-01-11 SURGERY — CARDIOVERSION
Anesthesia: General

## 2013-01-11 MED ORDER — SODIUM CHLORIDE 0.9 % IV SOLN
INTRAVENOUS | Status: DC
  Administered 2013-01-11: 11:00:00 via INTRAVENOUS

## 2013-01-11 MED ORDER — PROPOFOL 10 MG/ML IV BOLUS
INTRAVENOUS | Status: DC | PRN
  Administered 2013-01-11: 50 mg via INTRAVENOUS

## 2013-01-11 NOTE — Anesthesia Postprocedure Evaluation (Signed)
  Anesthesia Post-op Note  Patient: Angela Blackburn  Procedure(s) Performed: Procedure(s): CARDIOVERSION (N/A)  Patient Location: PACU and Short Stay  Anesthesia Type:MAC  Level of Consciousness: awake, alert  and oriented  Airway and Oxygen Therapy: Patient Spontanous Breathing  Post-op Pain: none  Post-op Assessment: Post-op Vital signs reviewed and Patient's Cardiovascular Status Stable  Post-op Vital Signs: Reviewed and stable  Complications: No apparent anesthesia complications

## 2013-01-11 NOTE — Anesthesia Preprocedure Evaluation (Signed)
Anesthesia Evaluation  Patient identified by MRN, date of birth, ID band Patient awake    Reviewed: Allergy & Precautions, H&P , NPO status , Patient's Chart, lab work & pertinent test results, reviewed documented beta blocker date and time   History of Anesthesia Complications Negative for: history of anesthetic complications  Airway Mallampati: II TM Distance: >3 FB Neck ROM: Full    Dental  (+) Partial Upper, Partial Lower and Dental Advisory Given   Pulmonary shortness of breath, COPD COPD inhaler,  breath sounds clear to auscultation        Cardiovascular hypertension, Pt. on medications + dysrhythmias Atrial Fibrillation Rhythm:Irregular Rate:Normal  Normal LVF by ECHO   Neuro/Psych    GI/Hepatic negative GI ROS, Neg liver ROS,   Endo/Other  Hypothyroidism   Renal/GU negative Renal ROS     Musculoskeletal  (+) Fibromyalgia -  Abdominal   Peds  Hematology   Anesthesia Other Findings   Reproductive/Obstetrics                           Anesthesia Physical Anesthesia Plan  ASA: III  Anesthesia Plan: General   Post-op Pain Management:    Induction: Intravenous  Airway Management Planned: Mask  Additional Equipment:   Intra-op Plan:   Post-operative Plan:   Informed Consent: I have reviewed the patients History and Physical, chart, labs and discussed the procedure including the risks, benefits and alternatives for the proposed anesthesia with the patient or authorized representative who has indicated his/her understanding and acceptance.   Dental advisory given  Plan Discussed with: Surgeon and CRNA  Anesthesia Plan Comments: (Plan routine monitors, GA for cardioversion)        Anesthesia Quick Evaluation

## 2013-01-11 NOTE — Transfer of Care (Signed)
Immediate Anesthesia Transfer of Care Note  Patient: Angela Blackburn  Procedure(s) Performed: Procedure(s): CARDIOVERSION (N/A)  Patient Location: PACU and Short Stay  Anesthesia Type:MAC  Level of Consciousness: alert   Airway & Oxygen Therapy: Patient Spontanous Breathing and Patient connected to nasal cannula oxygen  Post-op Assessment: Report given to PACU RN and Post -op Vital signs reviewed and stable  Post vital signs: Reviewed and stable  Complications: No apparent anesthesia complications

## 2013-01-11 NOTE — Procedures (Signed)
Electrical Cardioversion Procedure Note Angela Blackburn [id number] 1933-02-20  Procedure: Electrical Cardioversion Indications:  Atrial Fibrillation  Procedure Details Consent: Risks of procedure as well as the alternatives and risks of each were explained to the (patient/caregiver).  Consent for procedure obtained. Time Out: Verified patient identification, verified procedure, site/side was marked, verified correct patient position, special equipment/implants available, medications/allergies/relevent history reviewed, required imaging and test results available.  Performed  Patient placed on cardiac monitor, pulse oximetry, supplemental oxygen as necessary.  Sedation given: Patient sedated by anesthesia with diprovan 50 mg IV by anesthesia. Pacer pads placed anterior and posterior chest.  Cardioverted 1 time(s).  Cardioverted at 120J.  Evaluation Findings: Post procedure EKG shows: NSR Complications: None Patient did tolerate procedure well.   Angela Blackburn 01/11/2013, 11:00 AM

## 2013-01-11 NOTE — Preoperative (Signed)
Beta Blockers   Reason not to administer Beta Blockers:Not Applicable 

## 2013-01-12 ENCOUNTER — Encounter (HOSPITAL_COMMUNITY): Payer: Self-pay | Admitting: Cardiology

## 2013-01-17 ENCOUNTER — Encounter: Payer: Self-pay | Admitting: Cardiovascular Disease

## 2013-01-17 ENCOUNTER — Ambulatory Visit (INDEPENDENT_AMBULATORY_CARE_PROVIDER_SITE_OTHER): Payer: Medicare Other | Admitting: Cardiovascular Disease

## 2013-01-17 ENCOUNTER — Other Ambulatory Visit: Payer: Medicare Other

## 2013-01-17 VITALS — BP 164/88 | HR 80 | Ht 66.0 in | Wt 175.8 lb

## 2013-01-17 DIAGNOSIS — E7800 Pure hypercholesterolemia, unspecified: Secondary | ICD-10-CM

## 2013-01-17 DIAGNOSIS — I4891 Unspecified atrial fibrillation: Secondary | ICD-10-CM

## 2013-01-17 DIAGNOSIS — E876 Hypokalemia: Secondary | ICD-10-CM

## 2013-01-17 DIAGNOSIS — I10 Essential (primary) hypertension: Secondary | ICD-10-CM

## 2013-01-17 MED ORDER — APIXABAN 5 MG PO TABS: 5.0000 mg | ORAL_TABLET | Freq: Two times a day (BID) | ORAL | Status: DC

## 2013-01-17 MED ORDER — POTASSIUM CHLORIDE CRYS ER 20 MEQ PO TBCR: 20.0000 meq | EXTENDED_RELEASE_TABLET | Freq: Every day | ORAL | Status: DC

## 2013-01-17 NOTE — Progress Notes (Signed)
Angela Blackburn Date of Birth  Jul 26, 1932 [facility] [address]    [address] [place], [place]  [address] [phone]  Fax  [phone]  Problem list: 1. Hypertension 2. History chest pains-negative stress Myoview study in November, 2009 3. Hypothyroidism 4. Hypercholesterolemia 5. Peripheral neuropathy  History of Present Illness:  Angela Blackburn is a 47 her old female with a history of hypertension, hypercholesterolemia and a chest pain. She's had a negative stress test in November 2009. She also has a history of hypothyroidism.  She was diagnosed as having a peripheral neuropathy recently.  She's been placed on some medication which seems to be helping some.  She's not eating quite as much exercise as she would like.  She's had a little bit of shortness breath with exertion.    Oct. 8, 2013 -  She checks her blood pressure at home periodically.  Her readings are on the low side.   She has been cutting back on her salt.  She's been having some episodes of jaw pain with radiation  across her chest and into her shoulders. This typically occurs at night when she's resting. He typically does not occur with exertion. It lasts for about 5 minutes.   June 02, 2012:  She has complained of some chest discomfort when I last saw her  in October. A stress Myoview study was normal. Chest normal left ventricular function and no ischemia.  She has continued to have some jaw / gum pain which then goes to her chest .  It typically occurs at night when she is in bed.  Never occurs with exercise.  She tries to exercise regularly ( water aerobics and walking)   Nov. 17, 2014:  She has had some chest pains earlier this year.  She still has some jaw pain.  Myoview study was normal.  She keeps her BP readings - all of her home readings are normal.   She had a cardioversion last week - was successful.  Unfortunately, she has already gone back into A-Fib.  She cannot tell whether her rhythm is  normal or in AF.    She does have some leg swelling  Current Outpatient Prescriptions on File Prior to Visit  Medication Sig Dispense Refill  . acetaminophen (TYLENOL) 500 MG tablet Take 1,000 mg by mouth every 6 (six) hours as needed for pain.      [name] albuterol (PROAIR HFA) 108 (90 BASE) MCG/ACT inhaler Inhale 2 puffs into the lungs every 6 (six) hours as needed for shortness of breath.       [name] amLODipine (NORVASC) 10 MG tablet Take 10 mg by mouth daily.       [name] apixaban (ELIQUIS) 5 MG TABS tablet Take 1 tablet (5 mg total) by mouth 2 (two) times daily.  60 tablet  0  . calcium carbonate (OS-CAL) 600 MG TABS tablet Take 600 mg by mouth daily with breakfast.      . Cholecalciferol (VITAMIN D) 2000 UNITS tablet Take 2,000 Units by mouth daily.      [name] docusate sodium (COLACE) 100 MG capsule Take 100 mg by mouth 2 (two) times daily.      . fish oil-omega-3 fatty acids 1000 MG capsule Take 1 g by mouth daily.      . furosemide (LASIX) 40 MG tablet Take 0.5 tablets (20 mg total) by mouth daily.      . Glucosamine-Chondroit-Vit C-Mn (GLUCOSAMINE CHONDR 1500 COMPLX) CAPS Take 1 capsule by mouth daily.      [name]  levothyroxine (SYNTHROID, LEVOTHROID) 125 MCG tablet Take 125 mcg by mouth daily.        . methylcellulose (ARTIFICIAL TEARS) 1 % ophthalmic solution Place 1 drop into both eyes as needed (for dry eyes).      . Multiple Vitamins-Minerals (ICAPS) CAPS Take 1 capsule by mouth daily.      . nortriptyline (PAMELOR) 75 MG capsule Take 75 mg by mouth at bedtime.        . potassium chloride SA (K-DUR,KLOR-CON) 20 MEQ tablet Take 1 tablet (20 mEq total) by mouth daily.      . psyllium (METAMUCIL) 58.6 % packet Take 1 packet by mouth daily as needed (constipation).       [name] tiotropium (SPIRIVA) 18 MCG inhalation capsule Place 18 mcg into inhaler and inhale daily.       No current facility-administered medications on file prior to visit.    Allergies  Allergen Reactions  . Crestor [Rosuvastatin  Calcium] Other (See Comments)    myalgia  . Lipitor [Atorvastatin Calcium] Other (See Comments)    myalgia  . Livalo [Pitavastatin Calcium] Other (See Comments)    myalgia  . Other Other (See Comments)    All CHOLESTEROL MEDS-cause myalgia  . Zetia [Ezetimibe] Other (See Comments)    myalgia    Past Medical History  Diagnosis Date  . Hypertension   . Chest pain   . Hypothyroidism   . Hyperlipidemia   . Knee pain   . Peripheral neuropathy   . Fibromyalgia   . COPD (chronic obstructive pulmonary disease)     Past Surgical History  Procedure Laterality Date  . US echocardiography  09/14/2007    EF 55-60%  . Cardiovascular stress test  08/29/2009    EF 86%  . Abdominal hysterectomy    . Joint replacement      Lt. TKA in 2010  . Cardioversion N/A 01/11/2013    Procedure: CARDIOVERSION;  Surgeon: Lewayne Bunting, MD;  Location: [facility] ENDOSCOPY;  Service: Cardiovascular;  Laterality: N/A;    History  Smoking status  . Never Smoker   Smokeless tobacco  . Not on file    History  Alcohol Use No    Family History  Problem Relation Age of Onset  . Heart failure Mother     Reviw of Systems:  Reviewed in the HPI.  All other systems are negative.  Physical Exam: BP 164/88  Pulse 80  Ht 5\' 6"  (1.676 m)  Wt 175 lb 12.8 oz (79.742 kg)  BMI 28.39 kg/m2 The patient is alert and oriented x 3.  The mood and affect are normal.   Skin: warm and dry.  Color is normal.    HEENT:   the sclera are nonicteric.  The mucous membranes are moist.  The carotids are 2+ without bruits.  There is no thyromegaly.  There is no JVD.    Lungs: clear.  The chest wall is non tender.    Heart: regular rate with a normal S1 and S2.  There are no murmurs, gallops, or rubs. The PMI is not displaced.     Abdomen: good bowel sounds.  There is no guarding or rebound.  There is no hepatosplenomegaly or tenderness.  There are no masses.   Extremities:  no clubbing, cyanosis,.  Mild 1+ edema  bilaterally.  The legs are without rashes.  The distal pulses are intact.   Neuro:  Cranial nerves II - XII are intact.  Motor and sensory functions are intact.  The gait is normal.  ECG: Nov. 17, 2014:  Atrial fib at 80.  NS ST abnormalities Assessment / Plan:

## 2013-01-17 NOTE — Assessment & Plan Note (Signed)
She is asymptomatic and at this point, has normal LV function.  She has mild  leg edema.  Have asked her to use compression hose and elevate her legs as needed.    At this point, I think we can continue rate control and anticoagulation .  She is asymptomatic and cannot tell if she is in AF or NSR.  I will see her in 3 months for follow up visit.  contniue Eliquis.  Will check for samples or the 30 day voucher.  She has some leg cramping.  Her K is normal.  Will check BMP at next visit.

## 2013-01-17 NOTE — Patient Instructions (Addendum)
Your physician recommends that you schedule a follow-up appointment in: 3 months   Your physician recommends that you continue on your current medications as directed. Please refer to the Current Medication list given to you today.  Your physician recommends that you return for lab work in: bmet in 3 months

## 2013-02-14 ENCOUNTER — Telehealth: Payer: Self-pay | Admitting: Cardiovascular Disease

## 2013-02-14 DIAGNOSIS — R600 Localized edema: Secondary | ICD-10-CM

## 2013-02-14 NOTE — Telephone Encounter (Signed)
New Message,   Pt called--- Problem with swelling of feet and ankles// Please call back to assist

## 2013-02-14 NOTE — Telephone Encounter (Signed)
Bilateral leg edema off on on x 1 week, worse on right leg. Denies worsening SOB. Pt does not weigh daily.  Pt is wearing support hose and is elevating during the day. Pt feels she needs help from an increase of lasix since lasix was reduced on her last visit. Pt was told that this makes her kidneys work harder but she felt she was at that point of needing lasix increased. Pt has been on ABX for cough/ bronchitis and admits to eating more canned soups. Sodium reduction was discussed and its importance. Chart/ labs were reviewed 01/07/13 k+4.0, BUN 14, Creat 1.1 Lasix was increased to 20 mg BID x 3 days, potassium chloride was kept the same.  lab date given for Thursday 02/17/13.   Pt verbalized understanding.

## 2013-02-17 ENCOUNTER — Other Ambulatory Visit (INDEPENDENT_AMBULATORY_CARE_PROVIDER_SITE_OTHER): Payer: Medicare Other

## 2013-02-17 DIAGNOSIS — R600 Localized edema: Secondary | ICD-10-CM

## 2013-02-17 DIAGNOSIS — R609 Edema, unspecified: Secondary | ICD-10-CM

## 2013-02-17 LAB — BASIC METABOLIC PANEL
Chloride: 102 mEq/L (ref 96–112)
Potassium: 3.8 mEq/L (ref 3.5–5.1)
Sodium: 137 mEq/L (ref 135–145)

## 2013-02-18 NOTE — Telephone Encounter (Signed)
Pt was called and informed her labs were ok from lasix increase. She is to return to lasix usual dose. Daily wt was re  taught along with high sodium foods to avoid. F/U app was made with Norma Fredrickson NP to review meds. Pt was told to call with further questions or concerns, pt agreed to plan.

## 2013-03-08 ENCOUNTER — Ambulatory Visit (INDEPENDENT_AMBULATORY_CARE_PROVIDER_SITE_OTHER): Payer: Medicare Other | Admitting: Nurse Practitioner

## 2013-03-08 ENCOUNTER — Encounter: Payer: Self-pay | Admitting: Nurse Practitioner

## 2013-03-08 VITALS — BP 150/90 | HR 77 | Ht 66.0 in | Wt 177.0 lb

## 2013-03-08 DIAGNOSIS — R609 Edema, unspecified: Secondary | ICD-10-CM

## 2013-03-08 DIAGNOSIS — I4891 Unspecified atrial fibrillation: Secondary | ICD-10-CM

## 2013-03-08 MED ORDER — AMLODIPINE BESYLATE 10 MG PO TABS: 5.0000 mg | ORAL_TABLET | Freq: Every day | ORAL | Status: DC

## 2013-03-08 NOTE — Patient Instructions (Addendum)
Continue with your current medicines but we are going to cut the Norvasc to a half a tablet (5mg ) each day  Monitor your blood pressure daily - keep a diary and bring in to your next visit - call us if your BP starts staying above 150 or higher   See Dr. Elease HashimotoNahser as planned next month  Continue to avoid salt and use your support stockings  Call the [facility] office at [phone] if you have any questions, problems or concerns.

## 2013-03-08 NOTE — Progress Notes (Signed)
Angela Blackburn Date of Birth: 14-Jan-1933 Medical Record #[id number]  History of Present Illness: Angela Blackburn is seen back today for a work in visit. Seen for Dr. Elease Hashimoto. She is a 80 year old female with HTN, HLD and hypothyroidism. Remote cardiac cath from 2001 was normal. Has had negative stress test in October of 2013 which was done for atypical chest pain and has continued to have jaw/chest pain - no exertional symptoms. She has chronic atrial fib - on Eliquis. Other issues include COPD, neuropathy, fibromyalgia and HLD.   Last seen here in November. Felt to be doing ok. Was to see Dr. Elease Hashimoto back in February.   Comes back today. Here alone.  Has had her lasix increased for a short time due to some increased swelling - had had more salt as well in the form of canned soups. This caused some renal insufficiency and her dose was cut back. She notes that she continues to have swelling - does not go down all the way overnight. Little short of breath but this seems unchanged. She is trying to cut back her salt and does use support stockings. Has had a recent cold and was given PCN - has had a rash that is improving. Weight down a little at home. No chest pain. BP running 123/80 at home. No awareness of her atrial fib.   Current Outpatient Prescriptions  Medication Sig Dispense Refill  . acetaminophen (TYLENOL) 500 MG tablet Take 1,000 mg by mouth every 6 (six) hours as needed for pain.      [name] albuterol (PROAIR HFA) 108 (90 BASE) MCG/ACT inhaler Inhale 2 puffs into the lungs every 6 (six) hours as needed for shortness of breath.       [name] amLODipine (NORVASC) 10 MG tablet Take 10 mg by mouth daily.       [name] apixaban (ELIQUIS) 5 MG TABS tablet Take 1 tablet (5 mg total) by mouth 2 (two) times daily.  60 tablet  0  . calcium carbonate (OS-CAL) 600 MG TABS tablet Take 600 mg by mouth daily with breakfast.      . Cholecalciferol (VITAMIN D) 2000 UNITS tablet Take 2,000 Units by mouth daily.      [name] docusate  sodium (COLACE) 100 MG capsule Take 100 mg by mouth 2 (two) times daily.      . fish oil-omega-3 fatty acids 1000 MG capsule Take 1 g by mouth daily.      . furosemide (LASIX) 40 MG tablet Take 0.5 tablets (20 mg total) by mouth daily.      . Glucosamine-Chondroit-Vit C-Mn (GLUCOSAMINE CHONDR 1500 COMPLX) CAPS Take 1 capsule by mouth daily.      [name] levothyroxine (SYNTHROID, LEVOTHROID) 125 MCG tablet Take 125 mcg by mouth daily.        . methylcellulose (ARTIFICIAL TEARS) 1 % ophthalmic solution Place 1 drop into both eyes as needed (for dry eyes).      . Multiple Vitamins-Minerals (ICAPS) CAPS Take 1 capsule by mouth daily.      . nortriptyline (PAMELOR) 75 MG capsule Take 75 mg by mouth at bedtime.        . potassium chloride SA (K-DUR,KLOR-CON) 20 MEQ tablet Take 1 tablet (20 mEq total) by mouth daily.  30 tablet  11  . psyllium (METAMUCIL) 58.6 % packet Take 1 packet by mouth daily as needed (constipation).       [name] tiotropium (SPIRIVA) 18 MCG inhalation capsule Place 18 mcg into inhaler and inhale  daily.       No current facility-administered medications for this visit.    Allergies  Allergen Reactions  . Crestor [Rosuvastatin Calcium] Other (See Comments)    myalgia  . Lipitor [Atorvastatin Calcium] Other (See Comments)    myalgia  . Livalo [Pitavastatin Calcium] Other (See Comments)    myalgia  . Other Other (See Comments)    All CHOLESTEROL MEDS-cause myalgia  . Zetia [Ezetimibe] Other (See Comments)    myalgia    Past Medical History  Diagnosis Date  . Hypertension   . Chest pain   . Hypothyroidism   . Hyperlipidemia   . Knee pain   . Peripheral neuropathy   . Fibromyalgia   . COPD (chronic obstructive pulmonary disease)     Past Surgical History  Procedure Laterality Date  . US echocardiography  09/14/2007    EF 55-60%  . Cardiovascular stress test  08/29/2009    EF 86%  . Abdominal hysterectomy    . Joint replacement      Lt. TKA in 2010  . Cardioversion N/A  01/11/2013    Procedure: CARDIOVERSION;  Surgeon: Lewayne Bunting, MD;  Location: [facility] ENDOSCOPY;  Service: Cardiovascular;  Laterality: N/A;    History  Smoking status  . Never Smoker   Smokeless tobacco  . Not on file    History  Alcohol Use No    Family History  Problem Relation Age of Onset  . Heart failure Mother     Review of Systems: The review of systems is per the HPI.  All other systems were reviewed and are negative.  Physical Exam: BP 150/90  Pulse 77  Ht 5\' 6"  (1.676 m)  Wt 177 lb (80.287 kg)  BMI 28.58 kg/m2  SpO2 100% Patient is very pleasant and in no acute distress. She remains obese. Skin is warm and dry. Color is normal.  HEENT is unremarkable. Normocephalic/atraumatic. PERRL. Sclera are nonicteric. Neck is supple. No masses. No JVD. Lungs are clear. Cardiac exam shows an irregular rhythm. Rate is ok. Abdomen is soft. Extremities are with 1 to 2+ bilateral edema. Gait and ROM are intact. No gross neurologic deficits noted.  Wt Readings from Last 3 Encounters:  03/08/13 177 lb (80.287 kg)  01/17/13 175 lb 12.8 oz (79.742 kg)  12/24/12 170 lb (77.111 kg)     LABORATORY DATA:  Lab Results  Component Value Date   WBC 6.1 01/07/2013   HGB 12.5 01/07/2013   HCT 37.7 01/07/2013   PLT 259.0 01/07/2013   GLUCOSE 112* 02/17/2013   CHOL 208* 10/01/2011   TRIG 86.0 10/01/2011   HDL 72.40 10/01/2011   LDLDIRECT 121.6 10/01/2011   ALT 17 10/01/2011   AST 22 10/01/2011   NA 137 02/17/2013   K 3.8 02/17/2013   CL 102 02/17/2013   CREATININE 1.2 02/17/2013   BUN 16 02/17/2013   CO2 28 02/17/2013   TSH 4.340 12/09/2012   INR 2.58* 03/14/2009   Echo Study Conclusions from October 2014  - Left ventricle: The cavity size was normal. Wall thickness was normal. Systolic function was normal. The estimated ejection fraction was in the range of 60% to 65%. - Mitral valve: Mild regurgitation. - Right atrium: The atrium was mildly dilated. - Pulmonary arteries: PA peak  pressure: 42mm Hg (S).   Myoview Impression from October 2013  Exercise Capacity: Lexiscan with no exercise.  BP Response: Normal blood pressure response.  Clinical Symptoms: No significant symptoms noted.  ECG Impression: No significant  ST segment change suggestive of ischemia.  Comparison with Prior Nuclear Study: No images to compare  Overall Impression: Normal stress nuclear study. No evidence of ischemia  LV Ejection Fraction: 82%. LV Wall Motion: NL LV Function; NL Wall Motion.  Vesta MixerPhilip J. Nahser, Montez HagemanJr., MD, [facility]  12/10/2011, 6:34 PM  Office - [phone]  Pager [phone]  Assessment / Plan:  1. Chronic atrial fib - managed with rate control and anticoagulation - remains asymptomatic.   2. HTN - BP by me is 130/80  3. HLD - statin intolerant  4. Chronic edema - on high dose Norvasc - will try cutting this in half to 5 mg - she is to continue to monitor her BP at home and be in touch with us if her systolic starts staying above [id number]. See her back next month as planned with her BP diary. She is to continue to restrict her salt and wear her support stockings. I have left her on her current dose of Lasix.   5. Recent URI - sounds like she is PCN allergic. Will flag her chart accordingly.   Patient is agreeable to this plan and will call if any problems develop in the interim.   Rosalio MacadamiaLori C. Xhaiden Coombs, RN, ANP-C [facility] [address] [place], [place]  [phone]

## 2013-04-19 ENCOUNTER — Ambulatory Visit: Payer: Medicare Other | Admitting: Cardiovascular Disease

## 2013-05-18 ENCOUNTER — Telehealth: Payer: Self-pay | Admitting: Cardiovascular Disease

## 2013-05-18 ENCOUNTER — Encounter: Payer: Self-pay | Admitting: Cardiovascular Disease

## 2013-05-18 ENCOUNTER — Ambulatory Visit (INDEPENDENT_AMBULATORY_CARE_PROVIDER_SITE_OTHER): Payer: Medicare Other | Admitting: Cardiovascular Disease

## 2013-05-18 VITALS — BP 148/80 | HR 80 | Ht 66.0 in | Wt 166.1 lb

## 2013-05-18 DIAGNOSIS — I4891 Unspecified atrial fibrillation: Secondary | ICD-10-CM

## 2013-05-18 DIAGNOSIS — E876 Hypokalemia: Secondary | ICD-10-CM

## 2013-05-18 MED ORDER — FUROSEMIDE 20 MG PO TABS: 20.0000 mg | ORAL_TABLET | Freq: Every day | ORAL | Status: DC

## 2013-05-18 MED ORDER — AMLODIPINE BESYLATE 5 MG PO TABS: 5.0000 mg | ORAL_TABLET | Freq: Every day | ORAL | Status: DC

## 2013-05-18 MED ORDER — METOPROLOL TARTRATE 25 MG PO TABS: 25.0000 mg | ORAL_TABLET | Freq: Two times a day (BID) | ORAL | Status: DC

## 2013-05-18 NOTE — Patient Instructions (Signed)
Your physician has recommended you make the following change in your medication:  Start metoprolol 25 mg twice daily 12 hours apart  Your physician recommends that you schedule a follow-up appointment in: 2-3 weeks with EKG  Your physician recommends that you return for lab work in: next ov needs bmet

## 2013-05-18 NOTE — Progress Notes (Signed)
Angela Blackburn Date of Birth  May 28, 1932 [facility] [address]    [address] [place], [place]  [address] [phone]  Fax  [phone]  Problem list: 1. Hypertension 2. History chest pains-negative stress Myoview study in November, 2009 3. Hypothyroidism 4. Hypercholesterolemia 5. Peripheral neuropathy  History of Present Illness:  Angela Blackburn is a 70 her old female with a history of hypertension, hypercholesterolemia and a chest pain. She's had a negative stress test in November 2009. She also has a history of hypothyroidism.  She was diagnosed as having a peripheral neuropathy recently.  She's been placed on some medication which seems to be helping some.  She's not eating quite as much exercise as she would like.  She's had a little bit of shortness breath with exertion.    Oct. 8, 2013 -  She checks her blood pressure at home periodically.  Her readings are on the low side.   She has been cutting back on her salt.  She's been having some episodes of jaw pain with radiation  across her chest and into her shoulders. This typically occurs at night when she's resting. He typically does not occur with exertion. It lasts for about 5 minutes.   June 02, 2012:  She has complained of some chest discomfort when I last saw her  in October. A stress Myoview study was normal. Chest normal left ventricular function and no ischemia.  She has continued to have some jaw / gum pain which then goes to her chest .  It typically occurs at night when she is in bed.  Never occurs with exercise.  She tries to exercise regularly ( water aerobics and walking)   Nov. 17, 2014:  She has had some chest pains earlier this year.  She still has some jaw pain.  Myoview study was normal.  She keeps her BP readings - all of her home readings are normal.   She had a cardioversion last week - was successful.  Unfortunately, she has already gone back into A-Fib.  She cannot tell whether her rhythm is  normal or in AF.   She does have some leg swelling  May 18, 2013:  Angela Blackburn has not been doing as well.  She has some shoulder blade pain - occurs with exertion.  Better with rest or tylenol.  Not associated with dyspnea or diaphoresis.    She was on Eliquis but she developed a rash.  She is not on Xarelto which she tolerates well.   Current Outpatient Prescriptions on File Prior to Visit  Medication Sig Dispense Refill  . acetaminophen (TYLENOL) 500 MG tablet Take 1,000 mg by mouth every 6 (six) hours as needed for pain.      [name] albuterol (PROAIR HFA) 108 (90 BASE) MCG/ACT inhaler Inhale 2 puffs into the lungs every 6 (six) hours as needed for shortness of breath.       [name] amLODipine (NORVASC) 10 MG tablet Take 0.5 tablets (5 mg total) by mouth daily.      [name] apixaban (ELIQUIS) 5 MG TABS tablet Take 1 tablet (5 mg total) by mouth 2 (two) times daily.  60 tablet  0  . calcium carbonate (OS-CAL) 600 MG TABS tablet Take 600 mg by mouth daily with breakfast.      . Cholecalciferol (VITAMIN D) 2000 UNITS tablet Take 2,000 Units by mouth daily.      [name] docusate sodium (COLACE) 100 MG capsule Take 100 mg by mouth 2 (two) times daily.      [name]  fish oil-omega-3 fatty acids 1000 MG capsule Take 1 g by mouth daily.      . furosemide (LASIX) 40 MG tablet Take 0.5 tablets (20 mg total) by mouth daily.      . Glucosamine-Chondroit-Vit C-Mn (GLUCOSAMINE CHONDR 1500 COMPLX) CAPS Take 1 capsule by mouth daily.      [name]. levothyroxine (SYNTHROID, LEVOTHROID) 125 MCG tablet Take 125 mcg by mouth daily.        . methylcellulose (ARTIFICIAL TEARS) 1 % ophthalmic solution Place 1 drop into both eyes as needed (for dry eyes).      . Multiple Vitamins-Minerals (ICAPS) CAPS Take 1 capsule by mouth daily.      . nortriptyline (PAMELOR) 75 MG capsule Take 75 mg by mouth at bedtime.        . potassium chloride SA (K-DUR,KLOR-CON) 20 MEQ tablet Take 1 tablet (20 mEq total) by mouth daily.  30 tablet  11  . psyllium  (METAMUCIL) 58.6 % packet Take 1 packet by mouth daily as needed (constipation).       [name]. tiotropium (SPIRIVA) 18 MCG inhalation capsule Place 18 mcg into inhaler and inhale daily.       No current facility-administered medications on file prior to visit.    Allergies  Allergen Reactions  . Crestor [Rosuvastatin Calcium] Other (See Comments)    myalgia  . Lipitor [Atorvastatin Calcium] Other (See Comments)    myalgia  . Livalo [Pitavastatin Calcium] Other (See Comments)    myalgia  . Other Other (See Comments)    All CHOLESTEROL MEDS-cause myalgia  . Zetia [Ezetimibe] Other (See Comments)    myalgia  . Lisinopril Cough  . Penicillins     Rash   . Statins Other (See Comments)    myalgia    Past Medical History  Diagnosis Date  . Hypertension   . Chest pain   . Hypothyroidism   . Hyperlipidemia   . Knee pain   . Peripheral neuropathy   . Fibromyalgia   . COPD (chronic obstructive pulmonary disease)     Past Surgical History  Procedure Laterality Date  . [place] echocardiography  09/14/2007    EF 55-60%  . Cardiovascular stress test  08/29/2009    EF 86%  . Abdominal hysterectomy    . Joint replacement      Lt. TKA in 2010  . Cardioversion N/A 01/11/2013    Procedure: CARDIOVERSION;  Surgeon: Lewayne BuntingBrian S Crenshaw, MD;  [facility];  Service: Cardiovascular;  Laterality: N/A;    History  Smoking status  . Never Smoker   Smokeless tobacco  . Not on file    History  Alcohol Use No    Family History  Problem Relation Age of Onset  . Heart failure Mother     Reviw of Systems:  Reviewed in the HPI.  All other systems are negative.  Physical Exam: BP 148/80  Pulse 80  Ht 5\' 6"  (1.676 m)  Wt 166 lb 1.9 oz (75.352 kg)  BMI 26.83 kg/m2 The patient is alert and oriented x 3.  The mood and affect are normal.   Skin: warm and dry.  Color is normal.    HEENT:   the sclera are nonicteric.  The mucous membranes are moist.  The carotids are 2+ without bruits.   There is no thyromegaly.  There is no JVD.    Lungs: clear.  The chest wall is non tender.    Heart: regular rate with a normal S1 and S2.  There are no murmurs, gallops, or rubs. The PMI is not displaced.     Abdomen: good bowel sounds.  There is no guarding or rebound.  There is no hepatosplenomegaly or tenderness.  There are no masses.   Extremities:  no clubbing, cyanosis,.  Mild 1+ edema bilaterally.  The legs are without rashes.  The distal pulses are intact.   Neuro:  Cranial nerves II - XII are intact.  Motor and sensory functions are intact.    The gait is normal.  ECG:  Assessment / Plan:

## 2013-05-18 NOTE — Telephone Encounter (Signed)
Medication strength changed so she doesn't have to cut tablets.

## 2013-05-18 NOTE — Assessment & Plan Note (Signed)
Still continues to have atrial fibrillation. Her rate is a little fast. I suspect that this tachycardia is the cause of her slight chest tightness. She's had several stress tests over the years and often been negative.  We'll add metoprolol 25 mg twice a day.  We'll see her again in 2-3 weeks. If she remains tachycardic and remained symptomatically we'll add flecainide at that time. Anticipate trying another cardioversion once we have her on flecainide 80 year old or 100 mg twice a day.  We will need to have her on metoprolol prior to starting her on flecainide.

## 2013-05-18 NOTE — Telephone Encounter (Signed)
New message      Pt was seen today---we forgot to change dosage on lasix (20mg ) and amlodipine (5mg )-----[facility].  Please call in new prescription

## 2013-05-23 ENCOUNTER — Telehealth: Payer: Self-pay | Admitting: Cardiovascular Disease

## 2013-05-23 MED ORDER — DILTIAZEM HCL ER COATED BEADS 240 MG PO CP24
240.0000 mg | ORAL_CAPSULE | Freq: Every day | ORAL | Status: DC
Start: 2013-05-23 — End: 2013-06-26

## 2013-05-23 NOTE — Telephone Encounter (Signed)
New message     patient calling having a reaction to new medication metoprolol  25 mg  - sob.     Only have in spells. Some sob last night.

## 2013-05-23 NOTE — Telephone Encounter (Signed)
Pt was informed/ pt has an app for f/u with Dr Elease HashimotoNahser already.

## 2013-05-23 NOTE — Telephone Encounter (Signed)
I recently started metoprolol in an effort to decrease her heart rate in atrial fibrillation. She's had some shortness breath. She's currently on amlodipine.  We will discontinue the metoprolol. We will discontinue the amlodipine 10 mg. We'll start her on diltiazem CD 240 mg a day.    Nurse visit for HR, BP and ECG in 2-3 weeks.

## 2013-05-23 NOTE — Telephone Encounter (Signed)
Called stating she has been SOB since starting the Metoprolol on 3/19.  States the SOB was worse yest and today.  BP last pm was 105/ but doesn't know what heart rate was.  Wants to know if she should continue Metoprolol.  Advised that Dr. Elease HashimotoNahser will be in the office this afternoon and will forward message to him and his nurse Sharrell KuJodette Briley,RN.

## 2013-06-03 ENCOUNTER — Ambulatory Visit (INDEPENDENT_AMBULATORY_CARE_PROVIDER_SITE_OTHER): Payer: Medicare Other | Admitting: Cardiovascular Disease

## 2013-06-03 ENCOUNTER — Encounter: Payer: Self-pay | Admitting: Cardiovascular Disease

## 2013-06-03 VITALS — BP 158/89 | HR 81 | Ht 66.0 in | Wt 170.8 lb

## 2013-06-03 DIAGNOSIS — Z01818 Encounter for other preprocedural examination: Secondary | ICD-10-CM

## 2013-06-03 DIAGNOSIS — R0989 Other specified symptoms and signs involving the circulatory and respiratory systems: Secondary | ICD-10-CM

## 2013-06-03 DIAGNOSIS — R0600 Dyspnea, unspecified: Secondary | ICD-10-CM

## 2013-06-03 DIAGNOSIS — R0609 Other forms of dyspnea: Secondary | ICD-10-CM

## 2013-06-03 DIAGNOSIS — I4891 Unspecified atrial fibrillation: Secondary | ICD-10-CM

## 2013-06-03 MED ORDER — FLECAINIDE ACETATE 50 MG PO TABS: 50.0000 mg | ORAL_TABLET | Freq: Two times a day (BID) | ORAL | Status: DC

## 2013-06-03 NOTE — Patient Instructions (Addendum)
Your physician has recommended you make the following change in your medication:  Start flecainide 50 mg twice daily 12 hours apart (start in 1 week) 06/13/13  Your physician recommends that you return for lab work ZO:XWRUin:next week i will call with lab date  Your physician has recommended that you have a Cardioversion (DCCV)// 06/22/13. Electrical Cardioversion uses a jolt of electricity to your heart either through paddles or wired patches attached to your chest. This is a controlled, usually prescheduled, procedure. Defibrillation is done under light anesthesia in the hospital, and you usually go home the day of the procedure. This is done to get your heart back into a normal rhythm. You are not awake for the procedure. Please see the instruction sheet given to you today.   Your physician recommends that you schedule a follow-up appointment in: 2 months

## 2013-06-03 NOTE — Assessment & Plan Note (Addendum)
Still continues to have atrial fibrillation. She did not tolerate the metoprolol but seems to be tolerating the diltiazem better. She still has shortness of breath especially with any sort of exertion.  She was started on a Z-Pak by her medical doctor. She's feeling a little bit better since starting this antibiotic.  We performed several stress tests over the years past the last one was October, 2013. All of these have been negative. She had a cardiac cath in the past which also revealed no significant coronary artery disease. We'll start her on flecainide 50 mg twice a day ( to start April 13 - she has been on Z-pack which interacts with Flecainide)   in addition to her other medications. We will schedule her for a cardioversion June 22, 2013.    If this is successful, we will schedule her for a stress test to assess the flecainide therapy in several weeks.  There was also another drug interaction between flecainide and Pamelor.   My Pharmacy database did state that this was rare.   I will see her back in 2-3 months.

## 2013-06-03 NOTE — Progress Notes (Signed)
Angela Blackburn Date of Birth  05/09/1932 [facility] [address]    [address] [place], [place]  [id number] [phone]  Fax  [phone]  Problem list: 1. Hypertension 2. History chest pains-negative stress Myoview study in November, 2009 3. Hypothyroidism 4. Hypercholesterolemia 5. Peripheral neuropathy  History of Present Illness:  Angela Blackburn is a [age] her old female with a history of hypertension, hypercholesterolemia and a chest pain. She's had a negative stress test in November 2009. She also has a history of hypothyroidism.  She was diagnosed as having a peripheral neuropathy recently.  She's been placed on some medication which seems to be helping some.  She's not eating quite as much exercise as she would like.  She's had a little bit of shortness breath with exertion.    Oct. 8, 2013 -  She checks her blood pressure at home periodically.  Her readings are on the low side.   She has been cutting back on her salt.  She's been having some episodes of jaw pain with radiation  across her chest and into her shoulders. This typically occurs at night when she's resting. He typically does not occur with exertion. It lasts for about 5 minutes.   June 02, 2012:  She has complained of some chest discomfort when I last saw her  in October. A stress Myoview study was normal.  She has normal left ventricular function and no ischemia.  She has continued to have some jaw / gum pain which then goes to her chest .  It typically occurs at night when she is in bed.  Never occurs with exercise.  She tries to exercise regularly ( water aerobics and walking)   Nov. 17, 2014:  She has had some chest pains earlier this year.  She still has some jaw pain.  Myoview study was normal.  She keeps her BP readings - all of her home readings are normal.   She had a cardioversion last week - was successful.  Unfortunately, she has already gone back into A-Fib.  She cannot tell whether her rhythm is  normal or in AF.   She does have some leg swelling  May 18, 2013:  Angela Blackburn has not been doing as well.  She has some shoulder blade pain - occurs with exertion.  Better with rest or tylenol.  Not associated with dyspnea or diaphoresis.    She was on Eliquis but she developed a rash.  She is now on Xarelto which she tolerates well.   June 03, 2013:  We'll try her on metoprolol at her last visit but she became short of breath. We substituted diltiazem for the metoprolol.  She does not think she is any better.    Current Outpatient Prescriptions on File Prior to Visit  Medication Sig Dispense Refill  . acetaminophen (TYLENOL) 500 MG tablet Take 1,000 mg by mouth every 6 (six) hours as needed for pain.      [name]. albuterol (PROAIR HFA) 108 (90 BASE) MCG/ACT inhaler Inhale 2 puffs into the lungs every 6 (six) hours as needed for shortness of breath.       . calcium carbonate (OS-CAL) 600 MG TABS tablet Take 600 mg by mouth daily with breakfast.      . Cholecalciferol (VITAMIN D) 2000 UNITS tablet Take 2,000 Units by mouth daily.      . clobetasol (TEMOVATE) 0.05 % external solution Apply topically.      [name]. diltiazem (CARDIZEM CD) 240 MG 24 hr  capsule Take 1 capsule (240 mg total) by mouth daily.  30 capsule  6  . docusate sodium (COLACE) 100 MG capsule Take 100 mg by mouth 2 (two) times daily.      . fish oil-omega-3 fatty acids 1000 MG capsule Take 1 g by mouth daily.      . furosemide (LASIX) 20 MG tablet Take 1 tablet (20 mg total) by mouth daily.  30 tablet  11  . Glucosamine-Chondroit-Vit C-Mn (GLUCOSAMINE CHONDR 1500 COMPLX) CAPS Take 1 capsule by mouth daily.      . hydrocortisone 2.5 % cream Apply topically.      [name] levothyroxine (SYNTHROID, LEVOTHROID) 125 MCG tablet Take 125 mcg by mouth daily.        . methylcellulose (ARTIFICIAL TEARS) 1 % ophthalmic solution Place 1 drop into both eyes as needed (for dry eyes).      . Multiple Vitamins-Minerals (ICAPS) CAPS Take 1 capsule by mouth  daily.      . nortriptyline (PAMELOR) 75 MG capsule Take 75 mg by mouth at bedtime.        . potassium chloride SA (K-DUR,KLOR-CON) 20 MEQ tablet Take 1 tablet (20 mEq total) by mouth daily.  30 tablet  11  . psyllium (METAMUCIL) 58.6 % packet Take 1 packet by mouth daily as needed (constipation).       . Rivaroxaban (XARELTO) 20 MG TABS tablet Take 20 mg by mouth.      . tiotropium (SPIRIVA) 18 MCG inhalation capsule Place 18 mcg into inhaler and inhale daily.       No current facility-administered medications on file prior to visit.    Allergies  Allergen Reactions  . Metoprolol Shortness Of Breath  . Crestor [Rosuvastatin Calcium] Other (See Comments)    myalgia  . Lipitor [Atorvastatin Calcium] Other (See Comments)    myalgia  . Livalo [Pitavastatin Calcium] Other (See Comments)    myalgia  . Other Other (See Comments)    All CHOLESTEROL MEDS-cause myalgia  . Zetia [Ezetimibe] Other (See Comments)    myalgia  . Lisinopril Cough  . Penicillins     Rash   . Statins Other (See Comments)    myalgia  . Eliquis [Apixaban]     Rash.    Past Medical History  Diagnosis Date  . Hypertension   . Chest pain   . Hypothyroidism   . Hyperlipidemia   . Knee pain   . Peripheral neuropathy   . Fibromyalgia   . COPD (chronic obstructive pulmonary disease)     Past Surgical History  Procedure Laterality Date  . US echocardiography  09/14/2007    EF 55-60%  . Cardiovascular stress test  08/29/2009    EF 86%  . Abdominal hysterectomy    . Joint replacement      Lt. TKA in 2010  . Cardioversion N/A 01/11/2013    Procedure: CARDIOVERSION;  Surgeon: Lewayne Bunting, MD;  Location: [facility] ENDOSCOPY;  Service: Cardiovascular;  Laterality: N/A;    History  Smoking status  . Never Smoker   Smokeless tobacco  . Not on file    History  Alcohol Use No    Family History  Problem Relation Age of Onset  . Heart failure Mother     Reviw of Systems:  Reviewed in the HPI.  All  other systems are negative.  Physical Exam: BP 158/89  Pulse 81  Ht 5\' 6"  (1.676 m)  Wt 170 lb 12.8 oz (77.474 kg)  BMI 27.58 kg/m2  The patient is alert and oriented x 3.  The mood and affect are normal.   Skin: warm and dry.  Color is normal.    HEENT:   the sclera are nonicteric.  The mucous membranes are moist.  The carotids are 2+ without bruits.  There is no thyromegaly.  There is no JVD.    Lungs: clear.  The chest wall is non tender.    Heart: regular rate with a normal S1 and S2.  There are no murmurs, gallops, or rubs. The PMI is not displaced.     Abdomen: good bowel sounds.  There is no guarding or rebound.  There is no hepatosplenomegaly or tenderness.  There are no masses.   Extremities:  no clubbing, cyanosis,.  Mild 1+ edema bilaterally.  The legs are without rashes.  The distal pulses are intact.   Neuro:  Cranial nerves II - XII are intact.  Motor and sensory functions are intact.    The gait is normal.  ECG:  Assessment / Plan:

## 2013-06-06 ENCOUNTER — Encounter: Payer: Self-pay | Admitting: *Deleted

## 2013-06-15 ENCOUNTER — Telehealth: Payer: Self-pay | Admitting: Cardiovascular Disease

## 2013-06-15 NOTE — Telephone Encounter (Signed)
New message     Pt started flecainide on Monday----now she has "spots" on her arms.  Could this be from the medication?

## 2013-06-15 NOTE — Telephone Encounter (Signed)
Pt states that she has tiny red spots on her arm that are almost gone but it is now time to take her next dose and will reevaluate later today. Denies itching. Flecainide was reviewed and tiny red spots are listed as a rare side effect, Dr Elease HashimotoNahser was made aware, pt to continue med and call if worsens. Pt made aware.

## 2013-06-20 ENCOUNTER — Other Ambulatory Visit (INDEPENDENT_AMBULATORY_CARE_PROVIDER_SITE_OTHER): Payer: Medicare Other

## 2013-06-20 DIAGNOSIS — Z01818 Encounter for other preprocedural examination: Secondary | ICD-10-CM

## 2013-06-20 DIAGNOSIS — R0600 Dyspnea, unspecified: Secondary | ICD-10-CM

## 2013-06-20 DIAGNOSIS — R0989 Other specified symptoms and signs involving the circulatory and respiratory systems: Secondary | ICD-10-CM

## 2013-06-20 DIAGNOSIS — R0609 Other forms of dyspnea: Secondary | ICD-10-CM

## 2013-06-20 DIAGNOSIS — I4891 Unspecified atrial fibrillation: Secondary | ICD-10-CM

## 2013-06-20 LAB — BASIC METABOLIC PANEL
BUN: 18 mg/dL (ref 6–23)
CALCIUM: 9.1 mg/dL (ref 8.4–10.5)
CO2: 28 mEq/L (ref 19–32)
CREATININE: 1 mg/dL (ref 0.4–1.2)
Chloride: 102 mEq/L (ref 96–112)
GFR: 54.96 mL/min — ABNORMAL LOW (ref >60.00)
Glucose, Bld: 68 mg/dL — ABNORMAL LOW (ref 70–99)
Potassium: 3.7 mEq/L (ref 3.5–5.1)
Sodium: 138 mEq/L (ref 135–145)

## 2013-06-20 LAB — CBC
HEMATOCRIT: 35.2 % — AB (ref 36.0–46.0)
Hemoglobin: 11.5 g/dL — ABNORMAL LOW (ref 12.0–15.0)
MCHC: 32.7 g/dL (ref 30.0–36.0)
MCV: 90 fl (ref 78.0–100.0)
Platelets: 299 10*3/uL (ref 150.0–400.0)
RBC: 3.91 Mil/uL (ref 3.87–5.11)
RDW: 17 % — ABNORMAL HIGH (ref 11.5–14.6)
WBC: 6.8 10*3/uL (ref 4.5–10.5)

## 2013-06-22 ENCOUNTER — Encounter (HOSPITAL_COMMUNITY): Payer: Medicare Other | Admitting: Anesthesiology

## 2013-06-22 ENCOUNTER — Ambulatory Visit (HOSPITAL_COMMUNITY): Payer: Medicare Other | Admitting: Anesthesiology

## 2013-06-22 ENCOUNTER — Ambulatory Visit (HOSPITAL_COMMUNITY)
Admission: RE | Admit: 2013-06-22 | Discharge: 2013-06-22 | Disposition: A | Discharge status: Home / Self Care | Payer: Medicare Other | Source: Ambulatory Visit | Attending: Cardiovascular Disease | Admitting: Cardiovascular Disease

## 2013-06-22 ENCOUNTER — Encounter (HOSPITAL_COMMUNITY)
Admission: RE | Disposition: A | Discharge status: Home / Self Care | Payer: Self-pay | Source: Ambulatory Visit | Attending: Cardiovascular Disease

## 2013-06-22 ENCOUNTER — Encounter (HOSPITAL_COMMUNITY): Payer: Self-pay | Admitting: Anesthesiology

## 2013-06-22 DIAGNOSIS — I4891 Unspecified atrial fibrillation: Secondary | ICD-10-CM | POA: Insufficient documentation

## 2013-06-22 DIAGNOSIS — E7800 Pure hypercholesterolemia, unspecified: Secondary | ICD-10-CM | POA: Insufficient documentation

## 2013-06-22 DIAGNOSIS — Z7901 Long term (current) use of anticoagulants: Secondary | ICD-10-CM | POA: Insufficient documentation

## 2013-06-22 DIAGNOSIS — I10 Essential (primary) hypertension: Secondary | ICD-10-CM | POA: Insufficient documentation

## 2013-06-22 DIAGNOSIS — IMO0001 Reserved for inherently not codable concepts without codable children: Secondary | ICD-10-CM | POA: Insufficient documentation

## 2013-06-22 DIAGNOSIS — E039 Hypothyroidism, unspecified: Secondary | ICD-10-CM | POA: Insufficient documentation

## 2013-06-22 DIAGNOSIS — J4489 Other specified chronic obstructive pulmonary disease: Secondary | ICD-10-CM | POA: Insufficient documentation

## 2013-06-22 DIAGNOSIS — Z96659 Presence of unspecified artificial knee joint: Secondary | ICD-10-CM | POA: Insufficient documentation

## 2013-06-22 DIAGNOSIS — J449 Chronic obstructive pulmonary disease, unspecified: Secondary | ICD-10-CM | POA: Insufficient documentation

## 2013-06-22 DIAGNOSIS — E785 Hyperlipidemia, unspecified: Secondary | ICD-10-CM | POA: Insufficient documentation

## 2013-06-22 DIAGNOSIS — M25569 Pain in unspecified knee: Secondary | ICD-10-CM | POA: Insufficient documentation

## 2013-06-22 DIAGNOSIS — G609 Hereditary and idiopathic neuropathy, unspecified: Secondary | ICD-10-CM | POA: Insufficient documentation

## 2013-06-22 HISTORY — PX: CARDIOVERSION: SHX1299

## 2013-06-22 SURGERY — CARDIOVERSION
Anesthesia: General

## 2013-06-22 MED ORDER — SODIUM CHLORIDE 0.9 % IV SOLN
INTRAVENOUS | Status: DC
  Administered 2013-06-22: 500 mL via INTRAVENOUS

## 2013-06-22 MED ORDER — PROPOFOL 10 MG/ML IV BOLUS
INTRAVENOUS | Status: DC | PRN
  Administered 2013-06-22: 60 mg via INTRAVENOUS

## 2013-06-22 MED ORDER — LIDOCAINE HCL (CARDIAC) 20 MG/ML IV SOLN
INTRAVENOUS | Status: DC | PRN
  Administered 2013-06-22: 20 mg via INTRAVENOUS

## 2013-06-22 MED ORDER — SODIUM CHLORIDE 0.9 % IV SOLN
INTRAVENOUS | Status: DC | PRN
  Administered 2013-06-22: 12:00:00 via INTRAVENOUS

## 2013-06-22 NOTE — Transfer of Care (Signed)
Immediate Anesthesia Transfer of Care Note  Patient: Angela Blackburn  Procedure(s) Performed: Procedure(s): CARDIOVERSION (N/A)  Patient Location: Endoscopy Unit  Anesthesia Type:General  Level of Consciousness: awake, alert  and oriented  Airway & Oxygen Therapy: Patient Spontanous Breathing and Patient connected to nasal cannula oxygen  Post-op Assessment: Report given to PACU RN, Post -op Vital signs reviewed and stable and Patient moving all extremities X 4  Post vital signs: Reviewed and stable  Complications: No apparent anesthesia complications

## 2013-06-22 NOTE — Anesthesia Postprocedure Evaluation (Signed)
  Anesthesia Post-op Note  Patient: Angela Blackburn  Procedure(s) Performed: Procedure(s): CARDIOVERSION (N/A)  Patient Location: Endoscopy Unit  Anesthesia Type:General  Level of Consciousness: awake, alert  and oriented  Airway and Oxygen Therapy: Patient Spontanous Breathing and Patient connected to nasal cannula oxygen  Post-op Pain: none  Post-op Assessment: Post-op Vital signs reviewed, Patient's Cardiovascular Status Stable, Respiratory Function Stable and Patent Airway  Post-op Vital Signs: Reviewed and stable  Last Vitals:  Filed Vitals:   06/22/13 1200  BP: 170/72  Pulse: 64  Temp:   Resp: 18    Complications: No apparent anesthesia complications

## 2013-06-22 NOTE — H&P (View-Only) (Signed)
Angela Blackburn Date of Birth  05/09/1932 [facility] [address]    [address] [place], [place]  [id number] [phone]  Fax  [phone]  Problem list: 1. Hypertension 2. History chest pains-negative stress Myoview study in November, 2009 3. Hypothyroidism 4. Hypercholesterolemia 5. Peripheral neuropathy  History of Present Illness:  Angela Blackburn is a [age] her old female with a history of hypertension, hypercholesterolemia and a chest pain. She's had a negative stress test in November 2009. She also has a history of hypothyroidism.  She was diagnosed as having a peripheral neuropathy recently.  She's been placed on some medication which seems to be helping some.  She's not eating quite as much exercise as she would like.  She's had a little bit of shortness breath with exertion.    Oct. 8, 2013 -  She checks her blood pressure at home periodically.  Her readings are on the low side.   She has been cutting back on her salt.  She's been having some episodes of jaw pain with radiation  across her chest and into her shoulders. This typically occurs at night when she's resting. He typically does not occur with exertion. It lasts for about 5 minutes.   June 02, 2012:  She has complained of some chest discomfort when I last saw her  in October. A stress Myoview study was normal.  She has normal left ventricular function and no ischemia.  She has continued to have some jaw / gum pain which then goes to her chest .  It typically occurs at night when she is in bed.  Never occurs with exercise.  She tries to exercise regularly ( water aerobics and walking)   Nov. 17, 2014:  She has had some chest pains earlier this year.  She still has some jaw pain.  Myoview study was normal.  She keeps her BP readings - all of her home readings are normal.   She had a cardioversion last week - was successful.  Unfortunately, she has already gone back into A-Fib.  She cannot tell whether her rhythm is  normal or in AF.   She does have some leg swelling  May 18, 2013:  Angela Blackburn has not been doing as well.  She has some shoulder blade pain - occurs with exertion.  Better with rest or tylenol.  Not associated with dyspnea or diaphoresis.    She was on Eliquis but she developed a rash.  She is now on Xarelto which she tolerates well.   June 03, 2013:  We'll try her on metoprolol at her last visit but she became short of breath. We substituted diltiazem for the metoprolol.  She does not think she is any better.    Current Outpatient Prescriptions on File Prior to Visit  Medication Sig Dispense Refill  . acetaminophen (TYLENOL) 500 MG tablet Take 1,000 mg by mouth every 6 (six) hours as needed for pain.      [name]. albuterol (PROAIR HFA) 108 (90 BASE) MCG/ACT inhaler Inhale 2 puffs into the lungs every 6 (six) hours as needed for shortness of breath.       . calcium carbonate (OS-CAL) 600 MG TABS tablet Take 600 mg by mouth daily with breakfast.      . Cholecalciferol (VITAMIN D) 2000 UNITS tablet Take 2,000 Units by mouth daily.      . clobetasol (TEMOVATE) 0.05 % external solution Apply topically.      [name]. diltiazem (CARDIZEM CD) 240 MG 24 hr  capsule Take 1 capsule (240 mg total) by mouth daily.  30 capsule  6  . docusate sodium (COLACE) 100 MG capsule Take 100 mg by mouth 2 (two) times daily.      . fish oil-omega-3 fatty acids 1000 MG capsule Take 1 g by mouth daily.      . furosemide (LASIX) 20 MG tablet Take 1 tablet (20 mg total) by mouth daily.  30 tablet  11  . Glucosamine-Chondroit-Vit C-Mn (GLUCOSAMINE CHONDR 1500 COMPLX) CAPS Take 1 capsule by mouth daily.      . hydrocortisone 2.5 % cream Apply topically.      [name] levothyroxine (SYNTHROID, LEVOTHROID) 125 MCG tablet Take 125 mcg by mouth daily.        . methylcellulose (ARTIFICIAL TEARS) 1 % ophthalmic solution Place 1 drop into both eyes as needed (for dry eyes).      . Multiple Vitamins-Minerals (ICAPS) CAPS Take 1 capsule by mouth  daily.      . nortriptyline (PAMELOR) 75 MG capsule Take 75 mg by mouth at bedtime.        . potassium chloride SA (K-DUR,KLOR-CON) 20 MEQ tablet Take 1 tablet (20 mEq total) by mouth daily.  30 tablet  11  . psyllium (METAMUCIL) 58.6 % packet Take 1 packet by mouth daily as needed (constipation).       . Rivaroxaban (XARELTO) 20 MG TABS tablet Take 20 mg by mouth.      . tiotropium (SPIRIVA) 18 MCG inhalation capsule Place 18 mcg into inhaler and inhale daily.       No current facility-administered medications on file prior to visit.    Allergies  Allergen Reactions  . Metoprolol Shortness Of Breath  . Crestor [Rosuvastatin Calcium] Other (See Comments)    myalgia  . Lipitor [Atorvastatin Calcium] Other (See Comments)    myalgia  . Livalo [Pitavastatin Calcium] Other (See Comments)    myalgia  . Other Other (See Comments)    All CHOLESTEROL MEDS-cause myalgia  . Zetia [Ezetimibe] Other (See Comments)    myalgia  . Lisinopril Cough  . Penicillins     Rash   . Statins Other (See Comments)    myalgia  . Eliquis [Apixaban]     Rash.    Past Medical History  Diagnosis Date  . Hypertension   . Chest pain   . Hypothyroidism   . Hyperlipidemia   . Knee pain   . Peripheral neuropathy   . Fibromyalgia   . COPD (chronic obstructive pulmonary disease)     Past Surgical History  Procedure Laterality Date  . US echocardiography  09/14/2007    EF 55-60%  . Cardiovascular stress test  08/29/2009    EF 86%  . Abdominal hysterectomy    . Joint replacement      Lt. TKA in 2010  . Cardioversion N/A 01/11/2013    Procedure: CARDIOVERSION;  Surgeon: Lewayne Bunting, MD;  Location: [facility] ENDOSCOPY;  Service: Cardiovascular;  Laterality: N/A;    History  Smoking status  . Never Smoker   Smokeless tobacco  . Not on file    History  Alcohol Use No    Family History  Problem Relation Age of Onset  . Heart failure Mother     Reviw of Systems:  Reviewed in the HPI.  All  other systems are negative.  Physical Exam: BP 158/89  Pulse 81  Ht 5\' 6"  (1.676 m)  Wt 170 lb 12.8 oz (77.474 kg)  BMI 27.58 kg/m2  The patient is alert and oriented x 3.  The mood and affect are normal.   Skin: warm and dry.  Color is normal.    HEENT:   the sclera are nonicteric.  The mucous membranes are moist.  The carotids are 2+ without bruits.  There is no thyromegaly.  There is no JVD.    Lungs: clear.  The chest wall is non tender.    Heart: regular rate with a normal S1 and S2.  There are no murmurs, gallops, or rubs. The PMI is not displaced.     Abdomen: good bowel sounds.  There is no guarding or rebound.  There is no hepatosplenomegaly or tenderness.  There are no masses.   Extremities:  no clubbing, cyanosis,.  Mild 1+ edema bilaterally.  The legs are without rashes.  The distal pulses are intact.   Neuro:  Cranial nerves II - XII are intact.  Motor and sensory functions are intact.    The gait is normal.  ECG:  Assessment / Plan:

## 2013-06-22 NOTE — Discharge Instructions (Signed)
Monitored Anesthesia Care  °Monitored anesthesia care is an anesthesia service for a medical procedure. Anesthesia is the loss of the ability to feel pain. It is produced by medications called anesthetics. It may affect a small area of your body (local anesthesia), a large area of your body (regional anesthesia), or your entire body (general anesthesia). The need for monitored anesthesia care depends your procedure, your condition, and the potential need for regional or general anesthesia. It is often provided during procedures where:  °· General anesthesia may be needed if there are complications. This is because you need special care when you are under general anesthesia.   °· You will be under local or regional anesthesia. This is so that you are able to have higher levels of anesthesia if needed.   °· You will receive calming medications (sedatives). This is especially the case if sedatives are given to put you in a semi-conscious state of relaxation (deep sedation). This is because the amount of sedative needed to produce this state can be hard to predict. Too much of a sedative can produce general anesthesia. °Monitored anesthesia care is performed by one or more caregivers who have special training in all types of anesthesia. You will need to meet with these caregivers before your procedure. During this meeting, they will ask you about your medical history. They will also give you instructions to follow. (For example, you will need to stop eating and drinking before your procedure. You may also need to stop or change medications you are taking.) During your procedure, your caregivers will stay with you. They will:  °· Watch your condition. This includes watching you blood pressure, breathing, and level of pain.   °· Diagnose and treat problems that occur.   °· Give medications if they are needed. These may include calming medications (sedatives) and anesthetics.   °· Make sure you are comfortable.   °Having  monitored anesthesia care does not necessarily mean that you will be under anesthesia. It does mean that your caregivers will be able to manage anesthesia if you need it or if it occurs. It also means that you will be able to have a different type of anesthesia than you are having if you need it. When your procedure is complete, your caregivers will continue to watch your condition. They will make sure any medications wear off before you are allowed to go home.  °Document Released: 11/13/2004 Document Revised: 06/14/2012 Document Reviewed: 03/31/2012 °ExitCare® Patient Information ©2014 [employer]. °Electrical Cardioversion °Electrical cardioversion is the delivery of a jolt of electricity to change the rhythm of the heart. Sticky patches or metal paddles are placed on the chest to deliver the electricity from a device. This is done to restore a normal rhythm. A rhythm that is too fast or not regular keeps the heart from pumping well. °Electrical cardioversion is done in an emergency if:  °· There is low or no blood pressure as a result of the heart rhythm.   °· Normal rhythm must be restored as fast as possible to protect the brain and heart from further damage.   °· It may save a life. °Cardioversion may be done for heart rhythms that are not immediately life-threatening, such as atrial fibrillation or flutter, in which:  °· The heart is beating too fast or is not regular.   °· Medicine to change the rhythm has not worked.   °· It is safe to wait in order to allow time for preparation. °· Symptoms of the abnormal rhythm are bothersome. °· The risk of   stroke and other serious complications can be reduced. °LET YOUR CAREGIVER KNOW ABOUT:  °· All medicines you are taking, including vitamins, herbs, eye drops, creams, and over-the-counter medicines.   °· Previous problems you or members of your family have had with the use of anesthetics.   °· Any blood disorders you have.   °· Previous surgeries you have had.    °· Medical conditions you have. °RISKS AND COMPLICATIONS  °Generally, this is a safe procedure. However, as with any procedure, complications can occur. Possible complications include:  °· Breathing problems related to the anesthetic used. °· Cardiac arrest This risk is rare. °· A blood clot that breaks free and travels to other parts of your body. This could cause a stroke or other problems. The risk of this is lowered by use of blood thinning medicine (anticoagulant) prior to the procedure. °BEFORE THE PROCEDURE  °· You may have tests to detect blood clots in your heart and evaluate heart function.  °· You may start taking anticoagulants so your blood does not clot as easily.   °· Medicines may be given to help stabilize your heart rate and rhythm. °PROCEDURE °· You will be given medicine through an IV tube to reduce discomfort and make you sleepy (sedative).   °· An electrical shock will be delivered. °AFTER THE PROCEDURE °Your heart rhythm will be watched to make sure it does not change. You may be able to go home within a few hours.  °Document Released: 02/07/2002 Document Revised: 12/08/2012 Document Reviewed: 09/01/2012 °ExitCare® Patient Information ©2014 [employer]. ° °

## 2013-06-22 NOTE — Anesthesia Preprocedure Evaluation (Signed)
Anesthesia Evaluation  Patient identified by MRN, date of birth, ID band Patient awake    Reviewed: Allergy & Precautions, H&P , NPO status , Patient's Chart, lab work & pertinent test results  History of Anesthesia Complications Negative for: history of anesthetic complications  Airway Mallampati: II TM Distance: >3 FB Neck ROM: Full    Dental  (+) Partial Upper, Partial Lower, Dental Advisory Given   Pulmonary shortness of breath, COPD COPD inhaler,  breath sounds clear to auscultation        Cardiovascular hypertension, Pt. on medications + dysrhythmias Atrial Fibrillation Rhythm:Irregular Rate:Normal  Normal LVF by ECHO   Neuro/Psych    GI/Hepatic negative GI ROS, Neg liver ROS,   Endo/Other  Hypothyroidism   Renal/GU negative Renal ROS     Musculoskeletal  (+) Fibromyalgia -  Abdominal   Peds  Hematology   Anesthesia Other Findings   Reproductive/Obstetrics                           Anesthesia Physical Anesthesia Plan  ASA: III  Anesthesia Plan: General   Post-op Pain Management:    Induction: Intravenous  Airway Management Planned: Mask  Additional Equipment:   Intra-op Plan:   Post-operative Plan:   Informed Consent: I have reviewed the patients History and Physical, chart, labs and discussed the procedure including the risks, benefits and alternatives for the proposed anesthesia with the patient or authorized representative who has indicated his/her understanding and acceptance.     Plan Discussed with: CRNA and Surgeon  Anesthesia Plan Comments:         Anesthesia Quick Evaluation

## 2013-06-22 NOTE — Interval H&P Note (Signed)
History and Physical Interval Note:  06/22/2013 11:46 AM  Angela Blackburn  has presented today for surgery, with the diagnosis of AFIB  The various methods of treatment have been discussed with the patient and family. After consideration of risks, benefits and other options for treatment, the patient has consented to  Procedure(s): CARDIOVERSION (N/A) as a surgical intervention .  The patient's history has been reviewed, patient examined, no change in status, stable for surgery.  I have reviewed the patient's chart and labs.  Questions were answered to the patient's satisfaction.     Deloris PingPhilip J Ranay Ketter

## 2013-06-22 NOTE — CV Procedure (Signed)
    Cardioversion Note  Angela Blackburn [id number] 03/17/1932  Procedure: DC Cardioversion Indications: Atrial fib  Procedure Details Consent: Obtained Time Out: Verified patient identification, verified procedure, site/side was marked, verified correct patient position, special equipment/implants available, Radiology Safety Procedures followed,  medications/allergies/relevent history reviewed, required imaging and test results available.  Performed  The patient has been on adequate anticoagulation.  The patient received IV Lidocaine 20 mg iv with Propofol 60 mg iv  for sedation.  Synchronous cardioversion was performed at 120  joules.  The cardioversion was successful.     Complications: No apparent complications Patient did tolerate procedure well.   Angela MixerPhilip J. Nahser, Montez HagemanJr., MD, [facility] 06/22/2013, 11:51 AM

## 2013-06-23 ENCOUNTER — Encounter (HOSPITAL_COMMUNITY): Payer: Self-pay | Admitting: Cardiovascular Disease

## 2013-06-24 ENCOUNTER — Inpatient Hospital Stay (HOSPITAL_COMMUNITY)
Admission: EM | Admit: 2013-06-24 | Discharge: 2013-06-26 | DRG: 310 | Disposition: A | Discharge status: Home / Self Care | Payer: Medicare Other | Attending: Cardiovascular Disease | Admitting: Cardiovascular Disease

## 2013-06-24 ENCOUNTER — Emergency Department (HOSPITAL_COMMUNITY): Payer: Medicare Other

## 2013-06-24 ENCOUNTER — Encounter (HOSPITAL_COMMUNITY): Payer: Self-pay | Admitting: Emergency Medicine

## 2013-06-24 ENCOUNTER — Telehealth: Payer: Self-pay | Admitting: Cardiovascular Disease

## 2013-06-24 DIAGNOSIS — I4891 Unspecified atrial fibrillation: Secondary | ICD-10-CM | POA: Diagnosis present

## 2013-06-24 DIAGNOSIS — J4489 Other specified chronic obstructive pulmonary disease: Secondary | ICD-10-CM | POA: Diagnosis present

## 2013-06-24 DIAGNOSIS — E876 Hypokalemia: Secondary | ICD-10-CM | POA: Diagnosis present

## 2013-06-24 DIAGNOSIS — R55 Syncope and collapse: Secondary | ICD-10-CM | POA: Diagnosis present

## 2013-06-24 DIAGNOSIS — R42 Dizziness and giddiness: Secondary | ICD-10-CM

## 2013-06-24 DIAGNOSIS — I498 Other specified cardiac arrhythmias: Principal | ICD-10-CM | POA: Diagnosis present

## 2013-06-24 DIAGNOSIS — J449 Chronic obstructive pulmonary disease, unspecified: Secondary | ICD-10-CM | POA: Diagnosis present

## 2013-06-24 DIAGNOSIS — I480 Paroxysmal atrial fibrillation: Secondary | ICD-10-CM | POA: Diagnosis present

## 2013-06-24 DIAGNOSIS — E039 Hypothyroidism, unspecified: Secondary | ICD-10-CM | POA: Diagnosis present

## 2013-06-24 DIAGNOSIS — T462X5A Adverse effect of other antidysrhythmic drugs, initial encounter: Secondary | ICD-10-CM | POA: Diagnosis present

## 2013-06-24 DIAGNOSIS — E785 Hyperlipidemia, unspecified: Secondary | ICD-10-CM | POA: Diagnosis present

## 2013-06-24 DIAGNOSIS — Z96659 Presence of unspecified artificial knee joint: Secondary | ICD-10-CM

## 2013-06-24 DIAGNOSIS — T463X5A Adverse effect of coronary vasodilators, initial encounter: Secondary | ICD-10-CM | POA: Diagnosis present

## 2013-06-24 DIAGNOSIS — Z79899 Other long term (current) drug therapy: Secondary | ICD-10-CM

## 2013-06-24 DIAGNOSIS — G609 Hereditary and idiopathic neuropathy, unspecified: Secondary | ICD-10-CM | POA: Diagnosis present

## 2013-06-24 DIAGNOSIS — I10 Essential (primary) hypertension: Secondary | ICD-10-CM | POA: Diagnosis present

## 2013-06-24 DIAGNOSIS — IMO0001 Reserved for inherently not codable concepts without codable children: Secondary | ICD-10-CM | POA: Diagnosis present

## 2013-06-24 DIAGNOSIS — R001 Bradycardia, unspecified: Secondary | ICD-10-CM

## 2013-06-24 DIAGNOSIS — E7800 Pure hypercholesterolemia, unspecified: Secondary | ICD-10-CM | POA: Diagnosis present

## 2013-06-24 DIAGNOSIS — Z7901 Long term (current) use of anticoagulants: Secondary | ICD-10-CM

## 2013-06-24 HISTORY — DX: Syncope and collapse: R55

## 2013-06-24 HISTORY — DX: Paroxysmal atrial fibrillation: I48.0

## 2013-06-24 LAB — CBC WITH DIFFERENTIAL/PLATELET
BASOS ABS: 0 10*3/uL (ref 0.0–0.1)
BASOS PCT: 0 % (ref 0–1)
EOS ABS: 0.2 10*3/uL (ref 0.0–0.7)
EOS PCT: 3 % (ref 0–5)
HCT: 37 % (ref 36.0–46.0)
Hemoglobin: 12.2 g/dL (ref 12.0–15.0)
Lymphocytes Relative: 34 % (ref 12–46)
Lymphs Abs: 2.1 10*3/uL (ref 0.7–4.0)
MCH: 29.8 pg (ref 26.0–34.0)
MCHC: 33 g/dL (ref 30.0–36.0)
MCV: 90.5 fL (ref 78.0–100.0)
Monocytes Absolute: 0.5 10*3/uL (ref 0.1–1.0)
Monocytes Relative: 7 % (ref 3–12)
Neutro Abs: 3.4 10*3/uL (ref 1.7–7.7)
Neutrophils Relative %: 56 % (ref 43–77)
Platelets: 276 10*3/uL (ref 150–400)
RBC: 4.09 MIL/uL (ref 3.87–5.11)
RDW: 16.2 % — AB (ref 11.5–15.5)
WBC: 6.2 10*3/uL (ref 4.0–10.5)

## 2013-06-24 LAB — BASIC METABOLIC PANEL
BUN: 20 mg/dL (ref 6–23)
CALCIUM: 9.4 mg/dL (ref 8.4–10.5)
CO2: 24 mEq/L (ref 19–32)
CREATININE: 1.04 mg/dL (ref 0.50–1.10)
Chloride: 101 mEq/L (ref 96–112)
GFR, EST AFRICAN AMERICAN: 58 mL/min — AB (ref >90)
GFR, EST NON AFRICAN AMERICAN: 50 mL/min — AB (ref >90)
Glucose, Bld: 106 mg/dL — ABNORMAL HIGH (ref 70–99)
Potassium: 4.3 mEq/L (ref 3.7–5.3)
Sodium: 140 mEq/L (ref 137–147)

## 2013-06-24 NOTE — ED Notes (Signed)
The pt fell Tuesday and Wednesday she was cardioverted for af .  [name].  Today she has had a slow heart rate and she does not feel very well dizziness and she has a headache.  Gait difficulty for awhile

## 2013-06-24 NOTE — Telephone Encounter (Signed)
New problem   Pt is having syncope eposides and bp is low per daughter calling.

## 2013-06-24 NOTE — Telephone Encounter (Signed)
Pt's daughter Larita FifeLynn, called because she states pt had cardioversion for A-fib on 06/22/13. Today pt has been feeling weak and dizzy, daughter said when she came home about 3:30 Pm pt had blocked out and pt's husband  was trying to get her off the floor. Daughter took pt's BP lying down 127/69 pulse 49, sitting 146 77 pulse 48 standing 114/60 pulse 47 beats/minute. Daughter states pt is okay as long she is sitting . Pt was D/C from the hospital with Diltiazem 240 mg once daily, Flecainide 50 mg twice a day, lasix 20 mg, and potassium 20 mEq once a day. Dr Tenny Crawoss DOD recommends for pt to go to the ER . Pt's daughter aware [facility]. ER charge nurse Efraim KaufmannMelissa, is aware that pt will arrive to the ER. The hospital's card master paged with no answer.

## 2013-06-25 ENCOUNTER — Encounter (HOSPITAL_COMMUNITY): Payer: Self-pay | Admitting: Internal Medicine

## 2013-06-25 DIAGNOSIS — R55 Syncope and collapse: Secondary | ICD-10-CM

## 2013-06-25 DIAGNOSIS — R42 Dizziness and giddiness: Secondary | ICD-10-CM

## 2013-06-25 DIAGNOSIS — E876 Hypokalemia: Secondary | ICD-10-CM

## 2013-06-25 DIAGNOSIS — I498 Other specified cardiac arrhythmias: Principal | ICD-10-CM

## 2013-06-25 DIAGNOSIS — I4891 Unspecified atrial fibrillation: Secondary | ICD-10-CM

## 2013-06-25 DIAGNOSIS — R001 Bradycardia, unspecified: Secondary | ICD-10-CM

## 2013-06-25 DIAGNOSIS — I10 Essential (primary) hypertension: Secondary | ICD-10-CM

## 2013-06-25 LAB — URINALYSIS, ROUTINE W REFLEX MICROSCOPIC
BILIRUBIN URINE: NEGATIVE
Glucose, UA: NEGATIVE mg/dL
Ketones, ur: NEGATIVE mg/dL
Nitrite: NEGATIVE
PROTEIN: 30 mg/dL — AB
Specific Gravity, Urine: 1.011 (ref 1.005–1.030)
UROBILINOGEN UA: 0.2 mg/dL (ref 0.0–1.0)
pH: 8 (ref 5.0–8.0)

## 2013-06-25 LAB — COMPREHENSIVE METABOLIC PANEL
ALK PHOS: 93 U/L (ref 39–117)
ALT: 33 U/L (ref 0–35)
AST: 40 U/L — ABNORMAL HIGH (ref 0–37)
Albumin: 3.3 g/dL — ABNORMAL LOW (ref 3.5–5.2)
BUN: 15 mg/dL (ref 6–23)
CO2: 24 mEq/L (ref 19–32)
CREATININE: 0.84 mg/dL (ref 0.50–1.10)
Calcium: 9 mg/dL (ref 8.4–10.5)
Chloride: 103 mEq/L (ref 96–112)
GFR calc non Af Amer: 65 mL/min — ABNORMAL LOW (ref >90)
GFR, EST AFRICAN AMERICAN: 75 mL/min — AB (ref >90)
GLUCOSE: 95 mg/dL (ref 70–99)
POTASSIUM: 3.6 meq/L — AB (ref 3.7–5.3)
Sodium: 141 mEq/L (ref 137–147)
TOTAL PROTEIN: 7.7 g/dL (ref 6.0–8.3)
Total Bilirubin: 0.6 mg/dL (ref 0.3–1.2)

## 2013-06-25 LAB — APTT: APTT: 56 s — AB (ref 24–37)

## 2013-06-25 LAB — URINE MICROSCOPIC-ADD ON

## 2013-06-25 LAB — MRSA PCR SCREENING: MRSA by PCR: NEGATIVE

## 2013-06-25 LAB — CBC
HCT: 35.4 % — ABNORMAL LOW (ref 36.0–46.0)
Hemoglobin: 11.4 g/dL — ABNORMAL LOW (ref 12.0–15.0)
MCH: 29.2 pg (ref 26.0–34.0)
MCHC: 32.2 g/dL (ref 30.0–36.0)
MCV: 90.5 fL (ref 78.0–100.0)
PLATELETS: 251 10*3/uL (ref 150–400)
RBC: 3.91 MIL/uL (ref 3.87–5.11)
RDW: 16.1 % — ABNORMAL HIGH (ref 11.5–15.5)
WBC: 7.9 10*3/uL (ref 4.0–10.5)

## 2013-06-25 LAB — PROTIME-INR
INR: 2.05 — AB (ref 0.00–1.49)
Prothrombin Time: 22.5 seconds — ABNORMAL HIGH (ref 11.6–15.2)

## 2013-06-25 MED ORDER — POLYVINYL ALCOHOL 1.4 % OP SOLN
1.0000 [drp] | OPHTHALMIC | Status: DC | PRN
  Filled 2013-06-25: qty 15

## 2013-06-25 MED ORDER — POTASSIUM CHLORIDE CRYS ER 20 MEQ PO TBCR
40.0000 meq | EXTENDED_RELEASE_TABLET | Freq: Every day | ORAL | Status: DC
  Administered 2013-06-25: 40 meq via ORAL
  Filled 2013-06-25: qty 2

## 2013-06-25 MED ORDER — ACETAMINOPHEN 500 MG PO TABS
1000.0000 mg | ORAL_TABLET | Freq: Four times a day (QID) | ORAL | Status: DC | PRN
  Administered 2013-06-26: 1000 mg via ORAL
  Filled 2013-06-25: qty 2

## 2013-06-25 MED ORDER — RIVAROXABAN 15 MG PO TABS
15.0000 mg | ORAL_TABLET | Freq: Every day | ORAL | Status: DC
  Administered 2013-06-25: 15 mg via ORAL
  Filled 2013-06-25 (×2): qty 1

## 2013-06-25 MED ORDER — DOCUSATE SODIUM 100 MG PO CAPS
100.0000 mg | ORAL_CAPSULE | Freq: Two times a day (BID) | ORAL | Status: DC
  Administered 2013-06-25 (×2): 100 mg via ORAL
  Filled 2013-06-25 (×4): qty 1

## 2013-06-25 MED ORDER — VITAMIN D 1000 UNITS PO TABS
2000.0000 [IU] | ORAL_TABLET | Freq: Every day | ORAL | Status: DC
  Administered 2013-06-25: 2000 [IU] via ORAL
  Filled 2013-06-25 (×2): qty 2

## 2013-06-25 MED ORDER — HYDRALAZINE HCL 50 MG PO TABS
50.0000 mg | ORAL_TABLET | Freq: Three times a day (TID) | ORAL | Status: DC
  Administered 2013-06-25 – 2013-06-26 (×3): 50 mg via ORAL
  Filled 2013-06-25 (×6): qty 1

## 2013-06-25 MED ORDER — CLOBETASOL PROPIONATE 0.05 % EX CREA
1.0000 "application " | TOPICAL_CREAM | Freq: Two times a day (BID) | CUTANEOUS | Status: DC
  Filled 2013-06-25: qty 15

## 2013-06-25 MED ORDER — RIVAROXABAN 20 MG PO TABS: 20.0000 mg | ORAL_TABLET | Freq: Every day | ORAL | Status: DC

## 2013-06-25 MED ORDER — CALCIUM CARBONATE 1250 (500 CA) MG PO TABS
1.0000 | ORAL_TABLET | Freq: Every day | ORAL | Status: DC
  Administered 2013-06-25 – 2013-06-26 (×2): 500 mg via ORAL
  Filled 2013-06-25 (×3): qty 1

## 2013-06-25 MED ORDER — HYDRALAZINE HCL 25 MG PO TABS
25.0000 mg | ORAL_TABLET | Freq: Three times a day (TID) | ORAL | Status: DC
  Administered 2013-06-25: 25 mg via ORAL
  Filled 2013-06-25 (×4): qty 1

## 2013-06-25 MED ORDER — SODIUM CHLORIDE 0.9 % IJ SOLN
3.0000 mL | Freq: Two times a day (BID) | INTRAMUSCULAR | Status: DC
  Administered 2013-06-25: 3 mL via INTRAVENOUS

## 2013-06-25 MED ORDER — OMEGA-3-ACID ETHYL ESTERS 1 G PO CAPS
1.0000 g | ORAL_CAPSULE | Freq: Every day | ORAL | Status: DC
  Administered 2013-06-25: 1 g via ORAL
  Filled 2013-06-25 (×2): qty 1

## 2013-06-25 MED ORDER — SODIUM CHLORIDE 0.9 % IV SOLN: 250.0000 mL | INTRAVENOUS | Status: DC | PRN

## 2013-06-25 MED ORDER — GLUCOSAMINE CHONDR 1500 COMPLX PO CAPS: 1.0000 | ORAL_CAPSULE | Freq: Every day | ORAL | Status: DC

## 2013-06-25 MED ORDER — RIVAROXABAN 20 MG PO TABS
20.0000 mg | ORAL_TABLET | Freq: Every day | ORAL | Status: DC
  Administered 2013-06-25: 20 mg via ORAL
  Filled 2013-06-25 (×2): qty 1

## 2013-06-25 MED ORDER — ZOLPIDEM TARTRATE 5 MG PO TABS: 5.0000 mg | ORAL_TABLET | Freq: Every evening | ORAL | Status: DC | PRN

## 2013-06-25 MED ORDER — OCUVITE-LUTEIN PO CAPS
1.0000 | ORAL_CAPSULE | Freq: Every day | ORAL | Status: DC
  Administered 2013-06-25: 1 via ORAL
  Filled 2013-06-25 (×2): qty 1

## 2013-06-25 MED ORDER — LEVOTHYROXINE SODIUM 125 MCG PO TABS
125.0000 ug | ORAL_TABLET | Freq: Every day | ORAL | Status: DC
  Administered 2013-06-25 – 2013-06-26 (×2): 125 ug via ORAL
  Filled 2013-06-25 (×3): qty 1

## 2013-06-25 MED ORDER — ALBUTEROL SULFATE (2.5 MG/3ML) 0.083% IN NEBU: 2.5000 mg | INHALATION_SOLUTION | Freq: Four times a day (QID) | RESPIRATORY_TRACT | Status: DC | PRN

## 2013-06-25 MED ORDER — SODIUM CHLORIDE 0.9 % IJ SOLN: 3.0000 mL | INTRAMUSCULAR | Status: DC | PRN

## 2013-06-25 MED ORDER — TIOTROPIUM BROMIDE MONOHYDRATE 18 MCG IN CAPS
18.0000 ug | ORAL_CAPSULE | Freq: Every day | RESPIRATORY_TRACT | Status: DC
  Administered 2013-06-25: 18 ug via RESPIRATORY_TRACT
  Filled 2013-06-25: qty 5

## 2013-06-25 MED ORDER — DIPHENHYDRAMINE HCL 25 MG PO CAPS: 25.0000 mg | ORAL_CAPSULE | Freq: Every evening | ORAL | Status: DC | PRN

## 2013-06-25 MED ORDER — POTASSIUM CHLORIDE CRYS ER 20 MEQ PO TBCR
20.0000 meq | EXTENDED_RELEASE_TABLET | Freq: Every day | ORAL | Status: DC
  Filled 2013-06-25: qty 1

## 2013-06-25 MED ORDER — SODIUM CHLORIDE 0.9 % IJ SOLN: 3.0000 mL | Freq: Two times a day (BID) | INTRAMUSCULAR | Status: DC

## 2013-06-25 MED ORDER — HYDROCORTISONE 2.5 % EX CREA
1.0000 "application " | TOPICAL_CREAM | Freq: Two times a day (BID) | CUTANEOUS | Status: DC
  Filled 2013-06-25: qty 30

## 2013-06-25 NOTE — ED Provider Notes (Signed)
Medical screening examination/treatment/procedure(s) were conducted as a shared visit with non-physician practitioner(s) and myself.  I personally evaluated the patient during the encounter.   Please see my separate note.     Candyce ChurnJohn David Pablo Stauffer III, MD 06/25/13 1536

## 2013-06-25 NOTE — Progress Notes (Signed)
Ward Givenshris Berge ,[place] informed of persistently high b/p's 160's to 180's over 75 to 80 . HR = 58 - 67 ( NSR) . Also , urine cloudy and Pt has supra-pubic tenderness. Orders for increase in hydralazine and u/a received .

## 2013-06-25 NOTE — H&P (Addendum)
Angela Blackburn is an 80 y.o. female.    Chief Complaint:  Primary Cardiologist: Dr. Acie Fredrickson HPI: Angela Blackburn is a 80 yo woman with PMH of hypertension, dyslipidemia, hypothyroidism, COPD, last known EF 60-65%who had a DCCV for atrial fibrillation on 06/22/13. Her daughter called in today because she's continued to feel weak and dizzy and later this afternoon Angela Blackburn "blacked out" and her husband had to get her off the floor - she didn't hit her head and she was only out for 10-15 seconds according to her husband who was in the next room. She had fairly normal BP on checks but HR in the 40s. She has some positional differences in HR with change in positions. She had a similar loss of consciousness on Tuesday the day before she had her DCCV.  No sick contacts, no significant travel, no previous syncopal episodes before this week. She is on diltiazem 240 mg daily, flecainide 50 mg bid and took her most recent doses in the morning of presentation. No chest pain.    Past Medical History  Diagnosis Date  . Hypertension   . Chest pain   . Hypothyroidism   . Hyperlipidemia   . Knee pain   . Peripheral neuropathy   . Fibromyalgia   . COPD (chronic obstructive pulmonary disease)    Past Surgical History  Procedure Laterality Date  . US echocardiography  09/14/2007    EF 55-60%  . Cardiovascular stress test  08/29/2009    EF 86%  . Abdominal hysterectomy    . Joint replacement      Lt. TKA in 2010  . Cardioversion N/A 01/11/2013    Procedure: CARDIOVERSION;  Surgeon: Lelon Perla, MD;  Location: [facility] ENDOSCOPY;  Service: Cardiovascular;  Laterality: N/A;  . Cardioversion N/A 06/22/2013    Procedure: CARDIOVERSION;  Surgeon: Thayer Headings, MD;  Location: [facility] ENDOSCOPY;  Service: Cardiovascular;  Laterality: N/A;    Family History  Problem Relation Age of Onset  . Heart failure Mother    Social History:  reports that she has never smoked. She does not have any smokeless tobacco history  on file. She reports that she does not drink alcohol or use illicit drugs.  Allergies:  Allergies  Allergen Reactions  . Metoprolol Shortness Of Breath  . Crestor [Rosuvastatin Calcium] Other (See Comments)    myalgia  . Lipitor [Atorvastatin Calcium] Other (See Comments)    myalgia  . Livalo [Pitavastatin Calcium] Other (See Comments)    myalgia  . Other Other (See Comments)    All CHOLESTEROL MEDS-cause myalgia  . Zetia [Ezetimibe] Other (See Comments)    myalgia  . Lisinopril Cough  . Penicillins     Rash   . Statins Other (See Comments)    myalgia  . Eliquis [Apixaban]     Rash.     (Not in a hospital admission)  Results for orders placed during the hospital encounter of 06/24/13 (from the past 48 hour(s))  CBC WITH DIFFERENTIAL     Status: Abnormal   Collection Time    06/24/13  6:09 PM      Result Value Ref Range   WBC 6.2  4.0 - 10.5 K/uL   RBC 4.09  3.87 - 5.11 MIL/uL   Hemoglobin 12.2  12.0 - 15.0 g/dL   HCT 37.0  36.0 - 46.0 %   MCV 90.5  78.0 - 100.0 fL   MCH 29.8  26.0 - 34.0 pg   MCHC 33.0  30.0 - 36.0 g/dL   RDW 16.2 (*) 11.5 - 15.5 %   Platelets 276  150 - 400 K/uL   Neutrophils Relative % 56  43 - 77 %   Neutro Abs 3.4  1.7 - 7.7 K/uL   Lymphocytes Relative 34  12 - 46 %   Lymphs Abs 2.1  0.7 - 4.0 K/uL   Monocytes Relative 7  3 - 12 %   Monocytes Absolute 0.5  0.1 - 1.0 K/uL   Eosinophils Relative 3  0 - 5 %   Eosinophils Absolute 0.2  0.0 - 0.7 K/uL   Basophils Relative 0  0 - 1 %   Basophils Absolute 0.0  0.0 - 0.1 K/uL  BASIC METABOLIC PANEL     Status: Abnormal   Collection Time    06/24/13  6:09 PM      Result Value Ref Range   Sodium 140  137 - 147 mEq/L   Potassium 4.3  3.7 - 5.3 mEq/L   Chloride 101  96 - 112 mEq/L   CO2 24  19 - 32 mEq/L   Glucose, Bld 106 (*) 70 - 99 mg/dL   BUN 20  6 - 23 mg/dL   Creatinine, Ser 1.04  0.50 - 1.10 mg/dL   Calcium 9.4  8.4 - 10.5 mg/dL   GFR calc non Af Amer 50 (*) >90 mL/min   GFR calc Af  Amer 58 (*) >90 mL/min   Comment: (NOTE)     The eGFR has been calculated using the CKD EPI equation.     This calculation has not been validated in all clinical situations.     eGFR's persistently <90 mL/min signify possible Chronic Kidney     Disease.   Ct Head Wo Contrast  06/24/2013   CLINICAL DATA:  Recurrent falls.  EXAM: CT HEAD WITHOUT CONTRAST  TECHNIQUE: Contiguous axial images were obtained from the base of the skull through the vertex without intravenous contrast.  COMPARISON:  Head CT scan 12/08/2012.  FINDINGS: The brain is atrophic and there is chronic microvascular ischemic change. No evidence of acute intracranial abnormality including infarct, hemorrhage, mass lesion, mass effect, midline shift or abnormal extra-axial fluid collection is identified. There is no hydrocephalus or pneumocephalus. The calvarium is intact. Mild ethmoid air cell disease is noted. Scalp lesion over the right frontal bone near the vertex is likely a sebaceous cyst.  IMPRESSION: No acute finding.  Atrophy and chronic microvascular ischemic change.   Electronically Signed   By: Inge Rise M.D.   On: 06/24/2013 21:17    Review of Systems  Constitutional: Positive for malaise/fatigue. Negative for fever and chills.  HENT: Negative for nosebleeds.   Eyes: Negative for double vision and discharge.  Respiratory: Negative for hemoptysis.   Cardiovascular: Positive for palpitations. Negative for chest pain and leg swelling.  Gastrointestinal: Negative for abdominal pain, diarrhea and constipation.  Genitourinary: Negative for dysuria and hematuria.  Musculoskeletal: Negative for joint pain and myalgias.  Skin: Negative for itching.  Neurological: Positive for dizziness, loss of consciousness and weakness. Negative for tingling, speech change and headaches.  Endo/Heme/Allergies: Negative for polydipsia.  Psychiatric/Behavioral: Negative for suicidal ideas and substance abuse.    Blood pressure 179/72,  pulse 60, temperature 98.1 F (36.7 C), temperature source Oral, resp. rate 18, height _0  (1.651 m), weight 75.297 kg (166 lb), SpO2 99.00%. Physical Exam  Nursing note and vitals reviewed. Constitutional: She is oriented to person, place, and time. She appears well-developed  and well-nourished. No distress.  HENT:  Head: Atraumatic.  Nose: Nose normal.  Mouth/Throat: Oropharynx is clear and moist. No oropharyngeal exudate.  Eyes: Conjunctivae and EOM are normal. Pupils are equal, round, and reactive to light. No scleral icterus.  Neck: Normal range of motion. Neck supple. No JVD present. No tracheal deviation present. No thyromegaly present.  Cardiovascular: Normal rate, regular rhythm and intact distal pulses.  Exam reveals no gallop.   Murmur heard. Systolic murmur at LSB II/VI  Respiratory: Effort normal and breath sounds normal. No respiratory distress. She has no wheezes. She has no rales.  GI: Soft. Bowel sounds are normal. She exhibits no distension. There is no tenderness. There is no rebound.  Musculoskeletal: Normal range of motion. She exhibits no edema and no tenderness.  Neurological: She is alert and oriented to person, place, and time. No cranial nerve deficit.  Skin: Skin is warm and dry. No rash noted. She is not diaphoretic. No erythema.  Psychiatric: She has a normal mood and affect. Her behavior is normal. Judgment and thought content normal.   labs reviewed; Cr 1.04, na 140, K 4.3, bun/cr 20/1.04, plt 276, h/h 12.2/37, wbc 6.2 10/14 Echo: EF 60-65%, mild MR, RVSP 42 mmHg 4/22 DCCV 10/13 Stress test negative  Problem List Bradycardia Symptomatic Syncope and Collapse Atrial fibrillation Dizziness Hypertension Dyslipidemia COPD  Assessment/Plan 80 yo woman with atrial fibrillation, hypertension, dyslipidemia, negative stress test 10/13, recent DCCV on flecainide and diltiazem who had a syncopal episode on Tuesday 4/21, DCCV 4/22 and found to have symptomatic  bradycardia after syncope and collapse today. Will continue to hold AV nodal blockers - no flecainide given this evening and holding tonight. These symptoms appear to be related to bradycardia. Given atrial fibrillation and significant bradycardia with syncope, she meets indication for pacemaker with sinus node dysfunction if she needs to be on concurrent AV nodal blockers for rate control. Will continue xarelto - give dose now since she takes at night. I opened up the potential need for pacemaker discussion which she is amenable too if need be. Will washout current medications and watch closely on telemetry.  - holding flecainide and diltiazem  - watch closely on telemetry, stepdown - no repeat TTE given 10/14 Echo with preserved EF - likely will need pacemaker early next week - continue spiriva  Jules Husbands 06/25/2013, 12:36 AM

## 2013-06-25 NOTE — ED Provider Notes (Addendum)
Medical screening examination/treatment/procedure(s) were conducted as a shared visit with non-physician practitioner(s) and myself.  I personally evaluated the patient during the encounter.   EKG Interpretation   Date/Time:  Friday June 24 2013 18:34:53 EDT Ventricular Rate:  49 PR Interval:  226 QRS Duration: 84 QT Interval:  478 QTC Calculation: 431 R Axis:   72 Text Interpretation:  Sinus bradycardia with 1st degree A-V block Low  voltage QRS Septal infarct , age undetermined Abnormal ECG Artifact  compared to prior, rate decreased Confirmed by [facility]  MD, TREY ([id number]) on  06/25/2013 3:26:10 PM      80 yo female presenting after syncope and near syncope.  On exam, well appearing, nontoxic, alert, normal mental status, soft systolic murmur, with RRR, lungs CTAB.  Plan admission for likely cardiogenic syncope.   Clinical Impression: 1. Bradycardia   2. Atrial fibrillation   3. Dizziness   4. HTN (hypertension)   5. Hypokalemia   6. Syncope and collapse       Angela ChurnJohn David [name], MD 06/25/13 1528  Angela RoofJohn David [name], MD 06/25/13 [phone]

## 2013-06-25 NOTE — Consult Note (Signed)
ELECTROPHYSIOLOGY CONSULT NOTE    Patient ID: Angela Blackburn MRN: [id number], DOB/AGE: 78/04/1932 78 y.o.  Admit date: 06/24/2013 Date of Consult: 06-25-13  Primary Physician: [name],MICHAEL C, MD Primary Cardiologist: Nahser  Reason for Consultation: atrial fibrillation and syncope  HPI:  Angela Blackburn is a 80 y.o. female with a past medical history significant for hypertension, hypothyroidism, hyperlipidemia, and atrial fibrillation.  She was placed on Flecainide and cardioverted back to SR last week after rate control medications did not relieve symptoms of fatigue and weakness (all EKG's of atrial fibrillation demonstrate rates 60-80). She was symptomatically improved in SR.  She has had two spells of pre-syncope and syncope. The first was the night before her cardioversion.  She was walking down the stairs at church, became dizzy, and slid to the ground.  She does not think she actually passed out that time.  The second was yesterday - she stood up to help her husband bring in groceries, became dizzy and fell to the ground.  Her husband heard her hit the floor. She does think the totally lost consciousness with that episode because she did not brace herself with her fall.  She recovered but was persistently weak and dizzy. Her daughter brought her to the ER where she was found to be bradycardic. She was admitted for further evaluation. Her Flecainide and Diltiazem have been held.  She has maintained SR since admission with heart rates in the 60's.  She is anticoagulated with Xarelto.   Lab work this admission is notable for K 3.6, Hgb 11.4.  Last TSH 12-2012 4.340. Last echo 12-10-2012 demonstrated EF 60-65%, mild MR, LA 34.   She lives at home with her husband. She denies chest pain, shortness of breath, LE edema, palpitations, or syncope prior to the two episodes described above.   EP has been asked to evaluate for treatment options.   ROS is negative except as outlined above.    Past Medical History  Diagnosis Date  . Hypertension   . Chest pain   . Hypothyroidism   . Hyperlipidemia   . Knee pain   . Peripheral neuropathy   . Fibromyalgia   . COPD (chronic obstructive pulmonary disease)      Surgical History:  Past Surgical History  Procedure Laterality Date  . [place] echocardiography  09/14/2007    EF 55-60%  . Cardiovascular stress test  08/29/2009    EF 86%  . Abdominal hysterectomy    . Joint replacement      Lt. TKA in 2010  . Cardioversion N/A 01/11/2013    Procedure: CARDIOVERSION;  Surgeon: Lewayne BuntingBrian S Crenshaw, MD;  [facility];  Service: Cardiovascular;  Laterality: N/A;  . Cardioversion N/A 06/22/2013    Procedure: CARDIOVERSION;  Surgeon: Vesta MixerPhilip J Nahser, MD;  [facility];  Service: Cardiovascular;  Laterality: N/A;     Prescriptions prior to admission  Medication Sig Dispense Refill  . acetaminophen (TYLENOL) 500 MG tablet Take 1,000 mg by mouth every 6 (six) hours as needed for pain.      [name]. albuterol (PROAIR HFA) 108 (90 BASE) MCG/ACT inhaler Inhale 2 puffs into the lungs every 6 (six) hours as needed for shortness of breath.       . calcium carbonate (OS-CAL) 600 MG TABS tablet Take 600 mg by mouth daily with breakfast.      . Cholecalciferol (VITAMIN D) 2000 UNITS tablet Take 2,000 Units by mouth daily.      . clobetasol (TEMOVATE) 0.05 %  external solution Apply 1 application topically 2 (two) times daily.       [name]. diltiazem (CARDIZEM CD) 240 MG 24 hr capsule Take 1 capsule (240 mg total) by mouth daily.  30 capsule  6  . docusate sodium (COLACE) 100 MG capsule Take 100 mg by mouth 2 (two) times daily.      . fish oil-omega-3 fatty acids 1000 MG capsule Take 1 g by mouth daily.      . flecainide (TAMBOCOR) 50 MG tablet Take 1 tablet (50 mg total) by mouth 2 (two) times daily.  60 tablet  6  . furosemide (LASIX) 20 MG tablet Take 1 tablet (20 mg total) by mouth daily.  30 tablet  11  . Glucosamine-Chondroit-Vit C-Mn  (GLUCOSAMINE CHONDR 1500 COMPLX) CAPS Take 1 capsule by mouth daily.      . hydrocortisone 2.5 % cream Apply 1 application topically 2 (two) times daily.       [name]. levothyroxine (SYNTHROID, LEVOTHROID) 125 MCG tablet Take 125 mcg by mouth daily.        . methylcellulose (ARTIFICIAL TEARS) 1 % ophthalmic solution Place 1 drop into both eyes as needed (for dry eyes).      . Multiple Vitamins-Minerals (ICAPS) CAPS Take 1 capsule by mouth daily.      . nortriptyline (PAMELOR) 75 MG capsule Take 75 mg by mouth at bedtime.        . potassium chloride SA (K-DUR,KLOR-CON) 20 MEQ tablet Take 1 tablet (20 mEq total) by mouth daily.  30 tablet  11  . psyllium (METAMUCIL) 58.6 % packet Take 1 packet by mouth daily as needed (constipation).       . Rivaroxaban (XARELTO) 20 MG TABS tablet Take 20 mg by mouth daily with supper.       . tiotropium (SPIRIVA) 18 MCG inhalation capsule Place 18 mcg into inhaler and inhale daily.        Inpatient Medications:  . calcium carbonate  1 tablet Oral Q breakfast  . cholecalciferol  2,000 Units Oral Daily  . clobetasol cream  1 application Topical BID  . docusate sodium  100 mg Oral BID  . hydrALAZINE  25 mg Oral 3 times per day  . hydrocortisone  1 application Topical BID  . levothyroxine  125 mcg Oral QAC breakfast  . multivitamin-lutein  1 capsule Oral Daily  . omega-3 acid ethyl esters  1 g Oral Daily  . potassium chloride SA  40 mEq Oral Daily  . rivaroxaban  15 mg Oral Q supper  . sodium chloride  3 mL Intravenous Q12H  . sodium chloride  3 mL Intravenous Q12H  . tiotropium  18 mcg Inhalation Daily    Allergies:  Allergies  Allergen Reactions  . Metoprolol Shortness Of Breath  . Crestor [Rosuvastatin Calcium] Other (See Comments)    myalgia  . Lipitor [Atorvastatin Calcium] Other (See Comments)    myalgia  . Livalo [Pitavastatin Calcium] Other (See Comments)    myalgia  . Other Other (See Comments)    All CHOLESTEROL MEDS-cause myalgia  . Zetia  [Ezetimibe] Other (See Comments)    myalgia  . Lisinopril Cough  . Penicillins     Rash   . Statins Other (See Comments)    myalgia  . Eliquis [Apixaban]     Rash.    History   Social History  . Marital Status: Married    Spouse Name: N/A    Number of Children: N/A  . Years of Education:  N/A   Occupational History  . Retired    Social History Main Topics  . Smoking status: Never Smoker   . Smokeless tobacco: Not on file  . Alcohol Use: No  . Drug Use: No  . Sexual Activity: Not Currently   Other Topics Concern  . Not on file   Social History Narrative  . No narrative on file     Family History  Problem Relation Age of Onset  . Heart failure Mother     Physical Exam: Filed Vitals:   06/25/13 0400 06/25/13 0756 06/25/13 0801 06/25/13 0959  BP: 157/61 185/75 193/75   Pulse: 58 66 65   Temp: 97.9 F (36.6 C)  97.8 F (36.6 C)   TempSrc: Oral  Axillary   Resp: 20 15 15    Height:      Weight:      SpO2: 98% 99% 98% 98%    GEN- The patient is elderly appearing, alert and oriented x 3 today.   Head- normocephalic, atraumatic Eyes-  Sclera clear, conjunctiva pink Ears- hearing intact Oropharynx- clear Neck- supple, no JVP Lymph- no cervical lymphadenopathy Lungs- Clear to ausculation bilaterally, normal work of breathing Heart- Regular rate and rhythm, no murmurs, rubs or gallops, PMI not laterally displaced GI- soft, NT, ND, + BS Extremities- no clubbing, cyanosis, or edema MS- no significant deformity or atrophy Skin- no rash or lesion Psych- euthymic mood, full affect Neuro- strength and sensation are intact   Labs:   Lab Results  Component Value Date   WBC 7.9 06/25/2013   HGB 11.4* 06/25/2013   HCT 35.4* 06/25/2013   MCV 90.5 06/25/2013   PLT 251 06/25/2013    Recent Labs Lab 06/25/13 0520  NA 141  K 3.6*  CL 103  CO2 24  BUN 15  CREATININE 0.84  CALCIUM 9.0  PROT 7.7  BILITOT 0.6  ALKPHOS 93  ALT 33  AST 40*  GLUCOSE 95       Radiology/Studies: Ct Head Wo Contrast 06/24/2013   CLINICAL DATA:  Recurrent falls.  EXAM: CT HEAD WITHOUT CONTRAST  TECHNIQUE: Contiguous axial images were obtained from the base of the skull through the vertex without intravenous contrast.  COMPARISON:  Head CT scan 12/08/2012.  FINDINGS: The brain is atrophic and there is chronic microvascular ischemic change. No evidence of acute intracranial abnormality including infarct, hemorrhage, mass lesion, mass effect, midline shift or abnormal extra-axial fluid collection is identified. There is no hydrocephalus or pneumocephalus. The calvarium is intact. Mild ethmoid air cell disease is noted. Scalp lesion over the right frontal bone near the vertex is likely a sebaceous cyst.  IMPRESSION: No acute finding.  Atrophy and chronic microvascular ischemic change.   Electronically Signed   By: Drusilla Kanner M.D.   On: 06/24/2013 21:17    ZOX:WRUEA brady, rate 49, PR 226, QRS 84, QTc 431  TELEMETRY: sinus rhythm in the 60's  Assessment and Plan:  1. Syncope Unclear etiology  Possibly due to bradycardia though we cannot be certain. I am concerned that she has been on flecainide for only a few weeks and has had syncope twice.  I think that we should stop diltiazem and flecainide and observe.  After washout, if heart rates improve and no other arrhythmias are observed, we will consider either multaq or tikosyn. No driving x 6 mmonths  2. Tachycardia/ bradycardia I do not see that she has had much tachycardia in the past.  Hopefully, we can avoid pacemaker by adjusting  medicines.  She is clear that she would prefer to not have a PPM if possible  3. afib Continue anticoagulation Stop flecainide Consider multaq or tikosyn depending on heart rates going forward  Ok to transfer to telemetry EP to follow

## 2013-06-25 NOTE — Progress Notes (Signed)
Report called to [facility] ,RN .the patient and family aware of pending tx to rm 3W 24 .

## 2013-06-25 NOTE — ED Provider Notes (Signed)
CSN: [id number]     Arrival date & time 06/24/13  1753 History   First MD Initiated Contact with Patient 06/24/13 2003     Chief Complaint  Patient presents with  . Near Syncope     (Consider location/radiation/quality/duration/timing/severity/associated sxs/prior Treatment) HPI Comments: Is a 80 year old female, who has had several episodes of falling and near syncope.  She was cardioverted 2 days ago for atrial fibrillation.  She was started on new medication, which include diltiazem 240 mg daily.  Since that time.  She has had symptomatic bradycardia.  She, states, that when she changes position from sitting or lying to standing.  She becomes extremely lightheaded and wobbly, not any chest pain, shortness of breath, nausea  Patient is a 80 y.o. female presenting with near-syncope. The history is provided by the patient.  Near Syncope This is a new problem. The current episode started in the past 7 days. Pertinent negatives include no chest pain, coughing, fever, nausea or weakness. The symptoms are aggravated by exertion. She has tried nothing for the symptoms. The treatment provided no relief.    Past Medical History  Diagnosis Date  . Hypertension   . Chest pain   . Hypothyroidism   . Hyperlipidemia   . Knee pain   . Peripheral neuropathy   . Fibromyalgia   . COPD (chronic obstructive pulmonary disease)    Past Surgical History  Procedure Laterality Date  . [place] echocardiography  09/14/2007    EF 55-60%  . Cardiovascular stress test  08/29/2009    EF 86%  . Abdominal hysterectomy    . Joint replacement      Lt. TKA in 2010  . Cardioversion N/A 01/11/2013    Procedure: CARDIOVERSION;  Surgeon: Lewayne BuntingBrian S Crenshaw, MD;  [facility];  Service: Cardiovascular;  Laterality: N/A;  . Cardioversion N/A 06/22/2013    Procedure: CARDIOVERSION;  Surgeon: Vesta MixerPhilip J Nahser, MD;  [facility];  Service: Cardiovascular;  Laterality: N/A;   Family History  Problem Relation  Age of Onset  . Heart failure Mother    History  Substance Use Topics  . Smoking status: Never Smoker   . Smokeless tobacco: Not on file  . Alcohol Use: No   OB History   Grav Para Term Preterm Abortions TAB SAB Ect Mult Living                 Review of Systems  Constitutional: Negative for fever.  Respiratory: Negative for cough.   Cardiovascular: Positive for near-syncope. Negative for chest pain, palpitations and leg swelling.  Gastrointestinal: Negative for nausea.  Neurological: Positive for light-headedness. Negative for weakness.  All other systems reviewed and are negative.     Allergies  Metoprolol; Crestor; Lipitor; Livalo; Other; Zetia; Lisinopril; Penicillins; Statins; and Eliquis  Home Medications   Prior to Admission medications   Medication Sig Start Date End Date Taking? Authorizing Provider  acetaminophen (TYLENOL) 500 MG tablet Take 1,000 mg by mouth every 6 (six) hours as needed for pain.   Yes Historical Provider, MD  albuterol (PROAIR HFA) 108 (90 BASE) MCG/ACT inhaler Inhale 2 puffs into the lungs every 6 (six) hours as needed for shortness of breath.    Yes Historical Provider, MD  calcium carbonate (OS-CAL) 600 MG TABS tablet Take 600 mg by mouth daily with breakfast.   Yes Historical Provider, MD  Cholecalciferol (VITAMIN D) 2000 UNITS tablet Take 2,000 Units by mouth daily.   Yes Historical Provider, MD  clobetasol (TEMOVATE) 0.05 %  external solution Apply 1 application topically 2 (two) times daily.  04/25/13 04/25/14 Yes Historical Provider, MD  diltiazem (CARDIZEM CD) 240 MG 24 hr capsule Take 1 capsule (240 mg total) by mouth daily. 05/23/13  Yes Vesta Mixer, MD  docusate sodium (COLACE) 100 MG capsule Take 100 mg by mouth 2 (two) times daily.   Yes Historical Provider, MD  fish oil-omega-3 fatty acids 1000 MG capsule Take 1 g by mouth daily.   Yes Historical Provider, MD  flecainide (TAMBOCOR) 50 MG tablet Take 1 tablet (50 mg total) by mouth 2  (two) times daily. 06/03/13  Yes Vesta Mixer, MD  furosemide (LASIX) 20 MG tablet Take 1 tablet (20 mg total) by mouth daily. 05/18/13  Yes Vesta Mixer, MD  Glucosamine-Chondroit-Vit C-Mn (GLUCOSAMINE CHONDR 1500 COMPLX) CAPS Take 1 capsule by mouth daily.   Yes Historical Provider, MD  hydrocortisone 2.5 % cream Apply 1 application topically 2 (two) times daily.  03/16/13 03/16/14 Yes Historical Provider, MD  levothyroxine (SYNTHROID, LEVOTHROID) 125 MCG tablet Take 125 mcg by mouth daily.     Yes Historical Provider, MD  methylcellulose (ARTIFICIAL TEARS) 1 % ophthalmic solution Place 1 drop into both eyes as needed (for dry eyes).   Yes Historical Provider, MD  Multiple Vitamins-Minerals (ICAPS) CAPS Take 1 capsule by mouth daily.   Yes Historical Provider, MD  nortriptyline (PAMELOR) 75 MG capsule Take 75 mg by mouth at bedtime.     Yes Historical Provider, MD  potassium chloride SA (K-DUR,KLOR-CON) 20 MEQ tablet Take 1 tablet (20 mEq total) by mouth daily. 01/17/13  Yes Vesta Mixer, MD  psyllium (METAMUCIL) 58.6 % packet Take 1 packet by mouth daily as needed (constipation).    Yes Historical Provider, MD  Rivaroxaban (XARELTO) 20 MG TABS tablet Take 20 mg by mouth daily with supper.  05/11/13  Yes Historical Provider, MD  tiotropium (SPIRIVA) 18 MCG inhalation capsule Place 18 mcg into inhaler and inhale daily.   Yes Historical Provider, MD   BP 188/97  Pulse 63  Temp(Src) 98.1 F (36.7 C) (Oral)  Resp 20  Ht 5\' 5"  (1.651 m)  Wt 166 lb (75.297 kg)  BMI 27.62 kg/m2  SpO2 99% Physical Exam  Vitals reviewed. Constitutional: She is oriented to person, place, and time. She appears well-developed and well-nourished.  HENT:  Head: Normocephalic.  Right Ear: External ear normal.  Left Ear: External ear normal.  Mouth/Throat: Oropharynx is clear and moist.  Eyes: Pupils are equal, round, and reactive to light.  Neck: Normal range of motion.  Cardiovascular: Regular rhythm.   Bradycardia present.   Pulmonary/Chest: Effort normal and breath sounds normal. No respiratory distress. She has no wheezes.  Abdominal: Soft. She exhibits no distension.  Musculoskeletal: Normal range of motion. She exhibits no edema.  Neurological: She is alert and oriented to person, place, and time.  Skin: There is pallor.    ED Course  Procedures (including critical care time) Labs Review Labs Reviewed  CBC WITH DIFFERENTIAL - Abnormal; Notable for the following:    RDW 16.2 (*)    All other components within normal limits  BASIC METABOLIC PANEL - Abnormal; Notable for the following:    Glucose, Bld 106 (*)    GFR calc non Af Amer 50 (*)    GFR calc Af Amer 58 (*)    All other components within normal limits  MRSA PCR SCREENING  COMPREHENSIVE METABOLIC PANEL  CBC  PROTIME-INR  APTT  Imaging Review Ct Head Wo Contrast  06/24/2013   CLINICAL DATA:  Recurrent falls.  EXAM: CT HEAD WITHOUT CONTRAST  TECHNIQUE: Contiguous axial images were obtained from the base of the skull through the vertex without intravenous contrast.  COMPARISON:  Head CT scan 12/08/2012.  FINDINGS: The brain is atrophic and there is chronic microvascular ischemic change. No evidence of acute intracranial abnormality including infarct, hemorrhage, mass lesion, mass effect, midline shift or abnormal extra-axial fluid collection is identified. There is no hydrocephalus or pneumocephalus. The calvarium is intact. Mild ethmoid air cell disease is noted. Scalp lesion over the right frontal bone near the vertex is likely a sebaceous cyst.  IMPRESSION: No acute finding.  Atrophy and chronic microvascular ischemic change.   Electronically Signed   By: Drusilla Kannerhomas  Dalessio M.D.   On: 06/24/2013 21:17     EKG Interpretation None      MDM  Patient has what appears to be symptomatic positional.  Bradycardia.  Could be a result of new medication that she is not use to at this point.  She has a resting heart rate in the  60s, but with position change, i.e., standing or sitting up, she drops into the 40s. Labs, EKG, head CT are all within normal parameters.  Dr. Tresa EndoKelly from the lower cardiology, will see patient Final diagnoses:  Bradycardia        Arman FilterGail K Tahjai Schetter, NP 06/25/13 [phone]

## 2013-06-25 NOTE — Progress Notes (Signed)
Sticky note left for Dr re high b/p's ---see VS section .

## 2013-06-25 NOTE — Progress Notes (Addendum)
ANTICOAGULATION CONSULT NOTE - Initial Consult  Pharmacy Consult for Xarelto Indication: atrial fibrillation  Allergies  Allergen Reactions  . Metoprolol Shortness Of Breath  . Crestor [Rosuvastatin Calcium] Other (See Comments)    myalgia  . Lipitor [Atorvastatin Calcium] Other (See Comments)    myalgia  . Livalo [Pitavastatin Calcium] Other (See Comments)    myalgia  . Other Other (See Comments)    All CHOLESTEROL MEDS-cause myalgia  . Zetia [Ezetimibe] Other (See Comments)    myalgia  . Lisinopril Cough  . Penicillins     Rash   . Statins Other (See Comments)    myalgia  . Eliquis [Apixaban]     Rash.    Patient Measurements: Height: 5\' 5"  (165.1 cm) Weight: 166 lb (75.297 kg) IBW/kg (Calculated) : 57  Vital Signs: Temp: 98.1 F (36.7 C) (04/24 1801) Temp src: Oral (04/24 1801) BP: 188/97 mmHg (04/25 0210) Pulse Rate: 63 (04/25 0210)  Labs:  Recent Labs  06/24/13 1809  HGB 12.2  HCT 37.0  PLT 276  CREATININE 1.04    Estimated Creatinine Clearance: 45.3 ml/min (by C-G formula based on Cr of 1.04).   Medical History: Past Medical History  Diagnosis Date  . Hypertension   . Chest pain   . Hypothyroidism   . Hyperlipidemia   . Knee pain   . Peripheral neuropathy   . Fibromyalgia   . COPD (chronic obstructive pulmonary disease)     Medications:  Prescriptions prior to admission  Medication Sig Dispense Refill  . acetaminophen (TYLENOL) 500 MG tablet Take 1,000 mg by mouth every 6 (six) hours as needed for pain.      [name]. albuterol (PROAIR HFA) 108 (90 BASE) MCG/ACT inhaler Inhale 2 puffs into the lungs every 6 (six) hours as needed for shortness of breath.       . calcium carbonate (OS-CAL) 600 MG TABS tablet Take 600 mg by mouth daily with breakfast.      . Cholecalciferol (VITAMIN D) 2000 UNITS tablet Take 2,000 Units by mouth daily.      . clobetasol (TEMOVATE) 0.05 % external solution Apply 1 application topically 2 (two) times daily.       [name].  diltiazem (CARDIZEM CD) 240 MG 24 hr capsule Take 1 capsule (240 mg total) by mouth daily.  30 capsule  6  . docusate sodium (COLACE) 100 MG capsule Take 100 mg by mouth 2 (two) times daily.      . fish oil-omega-3 fatty acids 1000 MG capsule Take 1 g by mouth daily.      . flecainide (TAMBOCOR) 50 MG tablet Take 1 tablet (50 mg total) by mouth 2 (two) times daily.  60 tablet  6  . furosemide (LASIX) 20 MG tablet Take 1 tablet (20 mg total) by mouth daily.  30 tablet  11  . Glucosamine-Chondroit-Vit C-Mn (GLUCOSAMINE CHONDR 1500 COMPLX) CAPS Take 1 capsule by mouth daily.      . hydrocortisone 2.5 % cream Apply 1 application topically 2 (two) times daily.       [name]. levothyroxine (SYNTHROID, LEVOTHROID) 125 MCG tablet Take 125 mcg by mouth daily.        . methylcellulose (ARTIFICIAL TEARS) 1 % ophthalmic solution Place 1 drop into both eyes as needed (for dry eyes).      . Multiple Vitamins-Minerals (ICAPS) CAPS Take 1 capsule by mouth daily.      . nortriptyline (PAMELOR) 75 MG capsule Take 75 mg by mouth at bedtime.        .[name]  potassium chloride SA (K-DUR,KLOR-CON) 20 MEQ tablet Take 1 tablet (20 mEq total) by mouth daily.  30 tablet  11  . psyllium (METAMUCIL) 58.6 % packet Take 1 packet by mouth daily as needed (constipation).       . Rivaroxaban (XARELTO) 20 MG TABS tablet Take 20 mg by mouth daily with supper.       . tiotropium (SPIRIVA) 18 MCG inhalation capsule Place 18 mcg into inhaler and inhale daily.       Scheduled:  . calcium carbonate  1 tablet Oral Q breakfast  . cholecalciferol  2,000 Units Oral Daily  . clobetasol cream  1 application Topical BID  . docusate sodium  100 mg Oral BID  . hydrocortisone  1 application Topical BID  . levothyroxine  125 mcg Oral QAC breakfast  . multivitamin-lutein  1 capsule Oral Daily  . omega-3 acid ethyl esters  1 g Oral Daily  . potassium chloride SA  20 mEq Oral Daily  . rivaroxaban  15 mg Oral Q supper  . sodium chloride  3 mL Intravenous  Q12H  . sodium chloride  3 mL Intravenous Q12H  . tiotropium  18 mcg Inhalation Daily    Assessment: 80yo female admitted w/ symptomatic bradycardia to continue Xarelto for Afib.  Pt is on Xarelto 20mg  daily PTA though dose should be reduced in pts w/ CrCl <50.  I have reviewed cardiology office notes, and it appears pt was switched from Eliquis to Xarelto by MD outside of regular cards office d/t rash reaction.   Plan:  Will change Xarelto to 15mg  po daily and f/u w/ outpt cards to ensure this is appropriate for pt.  Vernard GamblesVeronda Bryk, PharmD, BCPS  06/25/2013,3:05 AM  1104 AM SCr improved significantly 0.84. CrCl ~ 55 ml/min Will change to Xarelto 20 mg po daily with supper  Starr LakeAlvin B Khaiden Segreto, PharmD

## 2013-06-25 NOTE — ED Notes (Signed)
Dr. Tresa EndoKelly, Cardiology at bedside.

## 2013-06-25 NOTE — Progress Notes (Signed)
PHARMACIST - PHYSICIAN COMMUNICATION DR:     DESCRIPTION: Pt is on Xarelto 20mg  daily PTA and sees cardiologist regularly, cont'd as inpatient; pt's CrCl is ~45 ml/min given age (SCr is at baseline), recommended dose of Xarelto w/ CrCl 15-50 is 15mg  daily.  Consider changing Xarelto to 15mg  daily.   Vernard GamblesVeronda Reneshia Zuccaro, PharmD, BCPS 06/25/2013 2:27 AM

## 2013-06-25 NOTE — Progress Notes (Addendum)
Subjective:  Feels better, no further syncope overnight. Syncopal episode on 06/24/13, 10-15 seconds. This occurred while walking to the kitchen, sudden onset without warning. Tuesday the day before her cardioversion had another episode. She also had an episode when walking down the stairs at church, during that episode she was not fully unconscious. She had been on diltiazem as well as flecainide.  Objective:  Vital Signs in the last 24 hours: Temp:  [97.9 F (36.6 C)-98.1 F (36.7 C)] 97.9 F (36.6 C) (04/25 0400) Pulse Rate:  [43-72] 65 (04/25 0801) Resp:  [14-20] 15 (04/25 0801) BP: (157-193)/(61-97) 193/75 mmHg (04/25 0801) SpO2:  [96 %-100 %] 98 % (04/25 0801) Weight:  [163 lb 9.3 oz (74.2 kg)-166 lb (75.297 kg)] 163 lb 9.3 oz (74.2 kg) (04/25 0210)  Intake/Output from previous day: 04/24 0701 - 04/25 0700 In: -  Out: 700 [Urine:700]   Physical Exam: General: Well developed, well nourished, in no acute distress. Head:  Normocephalic and atraumatic. Lungs: Clear to auscultation and percussion. Heart: Normal S1 and S2, bradycardic regular.  2/6 systolic murmur left lower sternal border, rubs or gallops.  Abdomen: soft, non-tender, positive bowel sounds. Extremities: No clubbing or cyanosis. No edema. Neurologic: Alert and oriented x 3.    Lab Results:  Recent Labs  06/24/13 1809 06/25/13 0520  WBC 6.2 7.9  HGB 12.2 11.4*  PLT 276 251    Recent Labs  06/24/13 1809 06/25/13 0520  NA 140 141  K 4.3 3.6*  CL 101 103  CO2 24 24  GLUCOSE 106* 95  BUN 20 15  CREATININE 1.04 0.84    Hepatic Function Panel  Recent Labs  06/25/13 0520  PROT 7.7  ALBUMIN 3.3*  AST 40*  ALT 33  ALKPHOS 93  BILITOT 0.6   Imaging: Ct Head Wo Contrast  06/24/2013   CLINICAL DATA:  Recurrent falls.  EXAM: CT HEAD WITHOUT CONTRAST  TECHNIQUE: Contiguous axial images were obtained from the base of the skull through the vertex without intravenous contrast.  COMPARISON:   Head CT scan 12/08/2012.  FINDINGS: The brain is atrophic and there is chronic microvascular ischemic change. No evidence of acute intracranial abnormality including infarct, hemorrhage, mass lesion, mass effect, midline shift or abnormal extra-axial fluid collection is identified. There is no hydrocephalus or pneumocephalus. The calvarium is intact. Mild ethmoid air cell disease is noted. Scalp lesion over the right frontal bone near the vertex is likely a sebaceous cyst.  IMPRESSION: No acute finding.  Atrophy and chronic microvascular ischemic change.   Electronically Signed   By: Drusilla Kannerhomas  Dalessio M.D.   On: 06/24/2013 21:17    Telemetry: Sinus bradycardia 50s Personally viewed.   EKG:  06/24/13-Sinus bradycardia rate 49 with QTC of 431, normal PR interval, normal QRS duration  ECHO: 12/10/12: - Left ventricle: The cavity size was normal. Wall thickness was normal. Systolic function was normal. The estimated ejection fraction was in the range of 60% to 65%. - Mitral valve: Mild regurgitation. - Right atrium: The atrium was mildly dilated. - Pulmonary arteries: PA peak pressure: 42mm Hg (S).  . calcium carbonate  1 tablet Oral Q breakfast  . cholecalciferol  2,000 Units Oral Daily  . clobetasol cream  1 application Topical BID  . docusate sodium  100 mg Oral BID  . hydrocortisone  1 application Topical BID  . levothyroxine  125 mcg Oral QAC breakfast  . multivitamin-lutein  1 capsule Oral Daily  . omega-3 acid ethyl esters  1 g Oral Daily  . potassium chloride SA  40 mEq Oral Daily  . rivaroxaban  15 mg Oral Q supper  . sodium chloride  3 mL Intravenous Q12H  . sodium chloride  3 mL Intravenous Q12H  . tiotropium  18 mcg Inhalation Daily    Assessment/Plan:  Active Problems:   HTN (hypertension)   Hypercholesterolemia   Dizziness   Atrial fibrillation   Syncope and collapse   Bradycardia  80 year old female with paroxysmal atrial fibrillation on both diltiazem as well as  flecainide at home with multiple episodes of syncope, sinus bradycardia, hypertension, hyperlipidemia.  1. Syncope-plan for pacemaker on Monday. Continue to hold both flecainide as well as the diltiazem. Symptomatic bradycardia. Discussed with Dr. Johney FrameAllred of EP. He will see in consultation. Pacemaker would be helpful in order to allow us to treat her atrium fibrillation which at times has been accompanied by elevated heart rate. 2. Hypokalemia -- replete potassium, currently 3.6 3. Chronic anticoagulation-INR 2.05-therapeutic 4. Hypertension-elevated. I will add hydralazine 25 mg 3 times a day until pacemaker is placed. At that time we can add back Cardizem.  I will transfer to telemetry.   Donato SchultzMark Alezandra Egli 06/25/2013, 8:22 AM

## 2013-06-26 ENCOUNTER — Other Ambulatory Visit: Payer: Self-pay | Admitting: Nurse Practitioner

## 2013-06-26 ENCOUNTER — Encounter (HOSPITAL_COMMUNITY): Payer: Self-pay | Admitting: Nurse Practitioner

## 2013-06-26 DIAGNOSIS — R55 Syncope and collapse: Secondary | ICD-10-CM

## 2013-06-26 DIAGNOSIS — I480 Paroxysmal atrial fibrillation: Secondary | ICD-10-CM | POA: Diagnosis present

## 2013-06-26 MED ORDER — DRONEDARONE HCL 400 MG PO TABS: 400.0000 mg | ORAL_TABLET | Freq: Two times a day (BID) | ORAL | Status: DC

## 2013-06-26 MED ORDER — HYDRALAZINE HCL 50 MG PO TABS: 50.0000 mg | ORAL_TABLET | Freq: Three times a day (TID) | ORAL | Status: DC

## 2013-06-26 NOTE — Progress Notes (Signed)
Pt provided with dc isntructions and education. Pt verbalized understanding. Pt has no questions at this time. Aware of all new medications and when to take them. IV removed with tip intact. Heart monitor cleaned and returned to front. Levonne Spillerhasidy Lynnlee Revels, RN

## 2013-06-26 NOTE — Discharge Instructions (Signed)
**  PLEASE REMEMBER TO BRING ALL OF YOUR MEDICATIONS TO EACH OF YOUR FOLLOW-UP OFFICE VISITS.  No driving x 6 mos.  DO NOT START MULTAQ UNTIL 06/27/2013.

## 2013-06-26 NOTE — Progress Notes (Signed)
SUBJECTIVE: The patient is doing well today.  At this time, she denies chest pain, shortness of breath, or any new concerns.  . calcium carbonate  1 tablet Oral Q breakfast  . cholecalciferol  2,000 Units Oral Daily  . clobetasol cream  1 application Topical BID  . docusate sodium  100 mg Oral BID  . hydrALAZINE  50 mg Oral 3 times per day  . hydrocortisone  1 application Topical BID  . levothyroxine  125 mcg Oral QAC breakfast  . multivitamin-lutein  1 capsule Oral Daily  . omega-3 acid ethyl esters  1 g Oral Daily  . potassium chloride SA  40 mEq Oral Daily  . rivaroxaban  20 mg Oral Q supper  . sodium chloride  3 mL Intravenous Q12H  . sodium chloride  3 mL Intravenous Q12H  . tiotropium  18 mcg Inhalation Daily      OBJECTIVE: Physical Exam: Filed Vitals:   06/25/13 1431 06/25/13 2056 06/25/13 2100 06/26/13 0400  BP: 132/59 172/80 172/80 134/80  Pulse: 68  68 78  Temp: 97.5 F (36.4 C)  97.9 F (36.6 C) 97.8 F (36.6 C)  TempSrc: Oral     Resp: 18  16 16   Height:      Weight:    161 lb (73.029 kg)  SpO2: 96%  95% 100%    Intake/Output Summary (Last 24 hours) at 06/26/13 0814 Last data filed at 06/26/13 0224  Gross per 24 hour  Intake    480 ml  Output    851 ml  Net   -371 ml    Telemetry reveals sinus rhythm, no arrhythmias or significant brady events, rates now 70s  GEN- The patient is well appearing, alert and oriented x 3 today.   Head- normocephalic, atraumatic Eyes-  Sclera clear, conjunctiva pink Ears- hearing intact Oropharynx- clear Neck- supple,   Lungs- Clear to ausculation bilaterally, normal work of breathing Heart- Regular rate and rhythm, no murmurs, rubs or gallops, PMI not laterally displaced GI- soft, NT, ND, + BS Extremities- no clubbing, cyanosis, or edema Neuro- strength and sensation are intact  LABS: Basic Metabolic Panel:  Recent Labs  40/98/1104/24/15 1809 06/25/13 0520  NA 140 141  K 4.3 3.6*  CL 101 103  CO2 24 24    GLUCOSE 106* 95  BUN 20 15  CREATININE 1.04 0.84  CALCIUM 9.4 9.0   Liver Function Tests:  Recent Labs  06/25/13 0520  AST 40*  ALT 33  ALKPHOS 93  BILITOT 0.6  PROT 7.7  ALBUMIN 3.3*   No results found for this basename: LIPASE, AMYLASE,  in the last 72 hours CBC:  Recent Labs  06/24/13 1809 06/25/13 0520  WBC 6.2 7.9  NEUTROABS 3.4  --   HGB 12.2 11.4*  HCT 37.0 35.4*  MCV 90.5 90.5  PLT 276 251   Anemia Panel: No results found for this basename: VITAMINB12, FOLATE, FERRITIN, TIBC, IRON, RETICCTPCT,  in the last 72 hours  RADIOLOGY: Ct Head Wo Contrast  06/24/2013   CLINICAL DATA:  Recurrent falls.  EXAM: CT HEAD WITHOUT CONTRAST  TECHNIQUE: Contiguous axial images were obtained from the base of the skull through the vertex without intravenous contrast.  COMPARISON:  Head CT scan 12/08/2012.  FINDINGS: The brain is atrophic and there is chronic microvascular ischemic change. No evidence of acute intracranial abnormality including infarct, hemorrhage, mass lesion, mass effect, midline shift or abnormal extra-axial fluid collection is identified. There is no hydrocephalus or pneumocephalus.  The calvarium is intact. Mild ethmoid air cell disease is noted. Scalp lesion over the right frontal bone near the vertex is likely a sebaceous cyst.  IMPRESSION: No acute finding.  Atrophy and chronic microvascular ischemic change.   Electronically Signed   By: Drusilla Kannerhomas  Dalessio M.D.   On: 06/24/2013 21:17    ASSESSMENT AND PLAN:  Active Problems:   HTN (hypertension)   Hypercholesterolemia   Dizziness   Atrial fibrillation   Syncope and collapse   Bradycardia  1. Syncope  Unclear etiology  Possibly due to bradycardia though we cannot be certain. Her resting sinus bradycardia is much better off of medicines. I am concerned that she has been on flecainide for only a few weeks and has had syncope twice. I think that we should use a different AAD long term.  Today we discussed  multaq and tikosyn as options.  She is clear that she would prefer multaq.  I will therefore start mutlaq 400mg  BID after adequate flecainide washout (OK to start tomorrow am).  30 day event monitor at discharge.  No driving x 6 months  2. Tachycardia/ bradycardia  I think that we can avoid PPM at this time 3. afib  Continue anticoagulation  Stop flecainide  Add multaq as above  Follow-up with me in 6 weeks Return to our office this Thurs or Friday for an ekg on multaq.  Discharge to home today

## 2013-06-26 NOTE — Discharge Summary (Signed)
Discharge Summary   Patient ID: Angela Blackburn,  MRN: [id number], DOB/AGE: 78/04/1932 78 y.o.  Admit date: 06/24/2013 Discharge date: 06/26/2013  Primary Care Provider: [name],MICHAEL C Primary Cardiologist: Katherina RightP. Nahser, MD  Electrophysiologist:  Shela CommonsJ. Meldrick Buttery, MD   Discharge Diagnoses Principal Problem:   Syncope  **In setting of baseline bradycardia.   **Flecainide and Diltiazem discontinued this admission.  Active Problems:   PAF (paroxysmal atrial fibrillation)  **S/P successful DCCV 06/22/2013.   **Flecainide discontinued in setting of syncope/bradycardia.     **Multaq will be initiated as an outpt.   Bradycardia   HTN (hypertension)   Hypercholesterolemia   Allergies Allergies  Allergen Reactions  . Metoprolol Shortness Of Breath  . Crestor [Rosuvastatin Calcium] Other (See Comments)    myalgia  . Lipitor [Atorvastatin Calcium] Other (See Comments)    myalgia  . Livalo [Pitavastatin Calcium] Other (See Comments)    myalgia  . Other Other (See Comments)    All CHOLESTEROL MEDS-cause myalgia  . Zetia [Ezetimibe] Other (See Comments)    myalgia  . Lisinopril Cough  . Penicillins     Rash   . Statins Other (See Comments)    myalgia  . Eliquis [Apixaban]     Rash.    Procedures  Non-contrast Head CT 06/24/2013  IMPRESSION: No acute finding. Atrophy and chronic microvascular ischemic change. _____________   History of Present Illness  80 y/o female with a h/o PAF, HTN, HL, hypothyroidism, and COPD, who recently underwent successful cardioversion in the setting of atrial fibrillation.  On the evening prior to her cardioversion, she experienced an episode of presyncope of unclear etiology.  Following her cardioversion, she initially did well and was maintained on flecainide and diltiazem therapy.  On 4/24, she was helping her husband bring groceries in from the car and became dizzy and then subsequently experienced syncope.  She recovered but continued to feel weak  and dizzy an her family brought her to the ER.  There, she was found to be bradycardic with rates in the 40's at times.  Head CT showed no acute findings.  She was admitted for further evaluation.  Hospital Course  Following admission, her home doses of flecainide and diltiazem were discontinued.  With this, she was hypertensive and required initiation of hydralazine therapy.  She had no further significant bradycardia.  She was evaluated by electrophysiology and it was agreed that discontinuation of flecainide and diltiazem was appropriate.  It was further recommended that following washout of flecainide, that she would benefit from initiation of an alternate antiarrhythmic drug.  After discussion with the patient regarding the options, ultimately it was decided to initiated Multaq @ 400mg  BID, to be started on 4/27.  With resolution of bradycardia, it was not felt that there was an indication for permanent pacemaker placement at this time, however we have arranged for a 30 day event monitor to assess for further bradycardia.  Angela Blackburn will be discharged home today in good condition.  We have arranged for her to come to the office on 4/30 for a f/u ECG given initiation of Multaq therapy.  She has been advised that she will not be able to drive for 6 mos.  Discharge Vitals Blood pressure 134/80, pulse 78, temperature 97.8 F (36.6 C), temperature source Oral, resp. rate 16, height 5\' 5"  (1.651 m), weight 161 lb (73.029 kg), SpO2 100.00%.  Filed Weights   06/24/13 1914 06/25/13 0210 06/26/13 0400  Weight: 166 lb (75.297 kg) 163 lb 9.3 oz (74.2  kg) 161 lb (73.029 kg)    Labs  CBC  Recent Labs  06/24/13 1809 06/25/13 0520  WBC 6.2 7.9  NEUTROABS 3.4  --   HGB 12.2 11.4*  HCT 37.0 35.4*  MCV 90.5 90.5  PLT 276 251   Basic Metabolic Panel  Recent Labs  06/24/13 1809 06/25/13 0520  NA 140 141  K 4.3 3.6*  CL 101 103  CO2 24 24  GLUCOSE 106* 95  BUN 20 15  CREATININE 1.04 0.84    CALCIUM 9.4 9.0   Liver Function Tests  Recent Labs  06/25/13 0520  AST 40*  ALT 33  ALKPHOS 93  BILITOT 0.6  PROT 7.7  ALBUMIN 3.3*   Disposition  Pt is being discharged home today in good condition.  Follow-up Plans & Appointments      Follow-up Information   Follow up with Hillis RangeJames Ab Leaming, MD. (we will arrange for f/u in ~ 6 wks and contact you.)    Specialty:  Cardiology   Contact information:   [address] [place] [address] [phone]       [facility]. (we will arrange for a f/u ECG and 30 day event monitor and contact you.)    Contact information:   [address] [place] [address] [phone]      Follow up with [name],MICHAEL C, MD. (as scheduled.)    Specialty:  Family Medicine   Contact information:   [address] [place] [address] [phone]      Discharge Medications    Medication List    STOP taking these medications       diltiazem 240 MG 24 hr capsule  Commonly known as:  CARDIZEM CD     flecainide 50 MG tablet  Commonly known as:  TAMBOCOR      TAKE these medications       acetaminophen 500 MG tablet  Commonly known as:  TYLENOL  Take 1,000 mg by mouth every 6 (six) hours as needed for pain.     calcium carbonate 600 MG Tabs tablet  Commonly known as:  OS-CAL  Take 600 mg by mouth daily with breakfast.     clobetasol 0.05 % external solution  Commonly known as:  TEMOVATE  Apply 1 application topically 2 (two) times daily.     docusate sodium 100 MG capsule  Commonly known as:  COLACE  Take 100 mg by mouth 2 (two) times daily.     dronedarone 400 MG tablet  Commonly known as:  MULTAQ  Take 1 tablet (400 mg total) by mouth 2 (two) times daily with a meal.     fish oil-omega-3 fatty acids 1000 MG capsule  Take 1 g by mouth daily.     furosemide 20 MG tablet  Commonly known as:  LASIX  Take 1 tablet (20 mg total) by mouth daily.      GLUCOSAMINE CHONDR 1500 COMPLX Caps  Take 1 capsule by mouth daily.     hydrALAZINE 50 MG tablet  Commonly known as:  APRESOLINE  Take 1 tablet (50 mg total) by mouth every 8 (eight) hours.     hydrocortisone 2.5 % cream  Apply 1 application topically 2 (two) times daily.     ICAPS Caps  Take 1 capsule by mouth daily.     levothyroxine 125 MCG tablet  Commonly known as:  SYNTHROID, LEVOTHROID  Take 125 mcg by mouth daily.  methylcellulose 1 % ophthalmic solution  Commonly known as:  ARTIFICIAL TEARS  Place 1 drop into both eyes as needed (for dry eyes).     nortriptyline 75 MG capsule  Commonly known as:  PAMELOR  Take 75 mg by mouth at bedtime.     potassium chloride SA 20 MEQ tablet  Commonly known as:  K-DUR,KLOR-CON  Take 1 tablet (20 mEq total) by mouth daily.     PROAIR HFA 108 (90 BASE) MCG/ACT inhaler  Generic drug:  albuterol  Inhale 2 puffs into the lungs every 6 (six) hours as needed for shortness of breath.     psyllium 58.6 % packet  Commonly known as:  METAMUCIL  Take 1 packet by mouth daily as needed (constipation).     tiotropium 18 MCG inhalation capsule  Commonly known as:  SPIRIVA  Place 18 mcg into inhaler and inhale daily.     Vitamin D 2000 UNITS tablet  Take 2,000 Units by mouth daily.     XARELTO 20 MG Tabs tablet  Generic drug:  rivaroxaban  Take 20 mg by mouth daily with supper.       Outstanding Labs/Studies  30 day event monitor and f/u ECG later this week.  Duration of Discharge Encounter   Greater than 30 minutes including physician time.  Signed, Ok Anis NP 06/26/2013, 8:47 AM  Hillis Range MD

## 2013-06-27 NOTE — Progress Notes (Signed)
Utilization review completed- retro 

## 2013-06-29 ENCOUNTER — Encounter (INDEPENDENT_AMBULATORY_CARE_PROVIDER_SITE_OTHER): Payer: Medicare Other

## 2013-06-29 ENCOUNTER — Encounter: Payer: Self-pay | Admitting: *Deleted

## 2013-06-29 DIAGNOSIS — R55 Syncope and collapse: Secondary | ICD-10-CM

## 2013-06-29 NOTE — Progress Notes (Signed)
Patient ID: Angela Blackburn, female   DOB: 03/27/1932, 80 y.o.   MRN: [id number] [facility] 30 day cardiac event monitor applied to patient.

## 2013-07-01 MED ORDER — HYDRALAZINE HCL 50 MG PO TABS: 25.0000 mg | ORAL_TABLET | Freq: Two times a day (BID) | ORAL | Status: DC

## 2013-07-01 NOTE — Telephone Encounter (Signed)
Spoke with patient who states she has been monitoring her BP since yesterday; states readings were:  89/53, HR 62;  91/57, HR 67, and @ 1720 81/47, HR 70.  Patient states this morning readings were:  92/55, HR 66 and more recently,  112/68, HR 65.  Patient c/o mild dizziness and states has had a mild h/a over the past few days.  I reviewed patient's findings with Norma FredricksonLori Gerhardt, NP who advised patient decrease Hydralazine to 25 mg twice daily and to continue to monitor BP.  I advised patient to call back next week if hypotension persists and to call EMS if at any time she has persistently low BP of less than 90 systolic over 60 diastolic.  I encouraged patient to drink 64 oz of water daily.  Patient verbalized understanding and agreement.

## 2013-07-01 NOTE — Telephone Encounter (Signed)
New message    Patient calling wanted to speak with nurse regarding her blood pressure is low . @ 8:40 this am  92/55 . Heart rate 66.   Just  112/68 , pulse 65.

## 2013-07-13 ENCOUNTER — Ambulatory Visit: Payer: Medicare Other | Admitting: Nurse Practitioner

## 2013-07-22 ENCOUNTER — Telehealth: Payer: Self-pay | Admitting: Cardiovascular Disease

## 2013-07-22 NOTE — Telephone Encounter (Signed)
Patient has been having low blood pressure readings. Patient is just feeling lightheaded.   5/19  8:00 pm  99/57 HR 58  5/20  7:22 am  88/56 HR 54           11:47 am 100/67 HR 58           8:20 pm  154/92 HR 66  5/21  8:30 am  88/57 HR 67          12:50 pm 169/83 HR 66 Felt like she was going to "pass out"          1:00 pm  104/63 HR 62           6:40 pm 122/75 HR 62                      10:07 pm 105/62 HR 59  5/22   8:25 am  84/56 HR 68           12:00 pm  100/63 HR 65   She saw Dr Cyndia Bent on 5/19 and he had suggested she hold her Hydralazine and she has not had any since her am dose that day. She was to call back today with her readings. Was recommended that she contact her cardiologist. Will forward to Dr Elease Hashimoto for recommendations.

## 2013-07-22 NOTE — Telephone Encounter (Signed)
Discussed with Dr Shirlee Latch and will have patient continue off Hydralazine, hold Lasix tomorrow, and drink plenty of fluids. Advised patient. Will forward to Dr Elease Hashimoto for further recommendations.

## 2013-07-22 NOTE — Telephone Encounter (Signed)
Per patient her husband has taken home blood pressure machine to appointments recently to make sure working accurately

## 2013-07-22 NOTE — Telephone Encounter (Signed)
New message      Pt is having low blood pressure readings---her PCP wanted her to call the cardiologist.  She has been off bp medication for 4 days

## 2013-07-22 NOTE — Telephone Encounter (Signed)
Phone busy. Will continue to try to reach pt. No other number listed for pt

## 2013-07-22 NOTE — Telephone Encounter (Signed)
Phone is still busy 

## 2013-07-26 NOTE — Telephone Encounter (Signed)
Called patient to check on her blood pressure and to find out how she is feeling today.  Patient reports BP today is 96/59, HR 64.  Patient states she continued to monitor BP over the weekend; states lowest reading was 62/43, HR 69 on 5/25 at 0830. Patient states she did feel light-headed at this time; denies headache or dizziness.  Patient states she has not had any light-headedness since that time.  Patient states BP readings yesterday were: 0830 62/43, HR 69 1015 98/63, HR 61 1253 91/59, HR 61 1930 147/80, HR 62 - patient states this was taken after attending a cook out and eating a hamburger  Patient states she feels that her heart rate is regular; c/o some mild SOB.  Patient continues to not take Hydralazine.  Patient did not take Lasix on Saturday - states she has not noticed any swelling or additional SOB.  I advised patient that I will send update to Dr. Elease Hashimoto for advice.  Patient verbalized understanding and agreement.

## 2013-07-27 NOTE — Telephone Encounter (Signed)
Returned call to patient she stated B/P today 07/27/13 86/55 pulse 68.Spoke to Dr.Nahser he advised to hold hydralazine and hold lasix.Advised to monitor B/P and call back in 1 week to report B/P readings.

## 2013-07-27 NOTE — Telephone Encounter (Signed)
Follow up    Returning call back to nurse from yesterday  

## 2013-08-03 ENCOUNTER — Ambulatory Visit: Payer: Medicare Other | Admitting: Cardiovascular Disease

## 2013-08-03 ENCOUNTER — Telehealth: Payer: Self-pay | Admitting: Nurse Practitioner

## 2013-08-03 NOTE — Telephone Encounter (Signed)
BP readings are all fairly normal.  Occasional low readings.  She should drink water when that occurs. No new suggestions.

## 2013-08-03 NOTE — Telephone Encounter (Signed)
Called patient to report monitor results that per Dr. Elease Hashimoto showed:  NSR with occasional PVCs and couplets.  Patient verbalized understanding and reports blood pressures over the last 3 days ranging from 95 to 133 systolic and 58 to 81 diastolic.  Patient had one episode of lower than normal blood pressure on 6/2 at 1:00 pm which was 86/50.  Patient reports that she has not had any dizziness, light-headedness or any other complaints.  Patient states she has been feeling well and remains off Lasix and Hydralazine.  Patient states she does notice some ankle edema and when she notices it she puts on her compression stockings.  I encouraged patient to elevate her legs when she is sitting.  I advised patient that I will forward message to Dr. Elease Hashimoto for advice regarding patient's blood pressure control and will call her back if there are changes.  Patient verbalized understanding and agreement.

## 2013-08-05 ENCOUNTER — Encounter: Payer: Self-pay | Admitting: Internal Medicine

## 2013-08-05 ENCOUNTER — Encounter (INDEPENDENT_AMBULATORY_CARE_PROVIDER_SITE_OTHER): Payer: Self-pay

## 2013-08-05 ENCOUNTER — Ambulatory Visit (INDEPENDENT_AMBULATORY_CARE_PROVIDER_SITE_OTHER): Payer: Medicare Other | Admitting: Internal Medicine

## 2013-08-05 VITALS — BP 170/82 | HR 60 | Ht 66.0 in | Wt 159.0 lb

## 2013-08-05 DIAGNOSIS — I4891 Unspecified atrial fibrillation: Secondary | ICD-10-CM

## 2013-08-05 DIAGNOSIS — I10 Essential (primary) hypertension: Secondary | ICD-10-CM

## 2013-08-05 DIAGNOSIS — R55 Syncope and collapse: Secondary | ICD-10-CM

## 2013-08-05 LAB — BASIC METABOLIC PANEL
BUN: 26 mg/dL — ABNORMAL HIGH (ref 6–23)
CO2: 26 mEq/L (ref 19–32)
Calcium: 9.4 mg/dL (ref 8.4–10.5)
Chloride: 99 mEq/L (ref 96–112)
Creatinine, Ser: 1.6 mg/dL — ABNORMAL HIGH (ref 0.4–1.2)
GFR: 32.35 mL/min — AB (ref >60.00)
GLUCOSE: 75 mg/dL (ref 70–99)
POTASSIUM: 4.8 meq/L (ref 3.5–5.1)
Sodium: 132 mEq/L — ABNORMAL LOW (ref 135–145)

## 2013-08-05 LAB — CBC WITH DIFFERENTIAL/PLATELET
Basophils Absolute: 0 10*3/uL (ref 0.0–0.1)
Basophils Relative: 0.5 % (ref 0.0–3.0)
EOS PCT: 2.4 % (ref 0.0–5.0)
Eosinophils Absolute: 0.2 10*3/uL (ref 0.0–0.7)
HCT: 37 % (ref 36.0–46.0)
HEMOGLOBIN: 12.3 g/dL (ref 12.0–15.0)
LYMPHS ABS: 3.2 10*3/uL (ref 0.7–4.0)
Lymphocytes Relative: 45.3 % (ref 12.0–46.0)
MCHC: 33.1 g/dL (ref 30.0–36.0)
MCV: 92 fl (ref 78.0–100.0)
MONOS PCT: 7.6 % (ref 3.0–12.0)
Monocytes Absolute: 0.5 10*3/uL (ref 0.1–1.0)
NEUTROS ABS: 3.2 10*3/uL (ref 1.4–7.7)
Neutrophils Relative %: 44.2 % (ref 43.0–77.0)
Platelets: 261 10*3/uL (ref 150.0–400.0)
RBC: 4.03 Mil/uL (ref 3.87–5.11)
RDW: 16.3 % — ABNORMAL HIGH (ref 11.5–15.5)
WBC: 7.2 10*3/uL (ref 4.0–10.5)

## 2013-08-05 LAB — HEPATIC FUNCTION PANEL
ALT: 26 U/L (ref 0–35)
AST: 33 U/L (ref 0–37)
Albumin: 3.7 g/dL (ref 3.5–5.2)
Alkaline Phosphatase: 79 U/L (ref 39–117)
BILIRUBIN TOTAL: 0.7 mg/dL (ref 0.2–1.2)
Bilirubin, Direct: 0.1 mg/dL (ref 0.0–0.3)
TOTAL PROTEIN: 7.1 g/dL (ref 6.0–8.3)

## 2013-08-05 NOTE — Patient Instructions (Signed)
Your physician recommends that you schedule a follow-up appointment as needed with Dr Johney Frame and in 3 months with Dr Melburn Popper   Your physician recommends that you return for lab work today: Liver/BMP/CBC

## 2013-08-05 NOTE — Progress Notes (Signed)
PCP:  [name],MICHAEL C, MD Primary Cardiologist:  Nahser  The patient presents today for routine electrophysiology followup.  Since her recent hospital discharge, the patient reports doing very well.  She is tolerating multaq without symptoms or recurrence of afib.  She has not had any further syncope.  Today, she denies symptoms of palpitations, chest pain, shortness of breath, orthopnea, PND, lower extremity edema, dizziness, or neurologic sequela.  The patient feels that she is tolerating medications without difficulties and is otherwise without complaint today.   Past Medical History  Diagnosis Date  . Hypertension   . Chest pain   . Hypothyroidism   . Hyperlipidemia   . Knee pain   . Peripheral neuropathy   . Fibromyalgia   . COPD (chronic obstructive pulmonary disease)   . PAF (paroxysmal atrial fibrillation)     a. 06/2012 Flecainide and Xarelto started;  b. 06/2012 DCCV;  c. 06/2012 Syncope in setting of bradycardia->flecainide and dilt d/c'd->Multaq started.  . Syncope     a. 06/2012 - in setting of bradycardia following dccv->flecainide and dilt d/c'd.   Past Surgical History  Procedure Laterality Date  . [place] echocardiography  09/14/2007    EF 55-60%  . Cardiovascular stress test  08/29/2009    EF 86%  . Abdominal hysterectomy    . Joint replacement      Lt. TKA in 2010  . Cardioversion N/A 01/11/2013    Procedure: CARDIOVERSION;  Surgeon: Lewayne BuntingBrian S Crenshaw, MD;  [facility];  Service: Cardiovascular;  Laterality: N/A;  . Cardioversion N/A 06/22/2013    Procedure: CARDIOVERSION;  Surgeon: Vesta MixerPhilip J Nahser, MD;  [facility];  Service: Cardiovascular;  Laterality: N/A;    Current Outpatient Prescriptions  Medication Sig Dispense Refill  . acetaminophen (TYLENOL) 500 MG tablet Take 1,000 mg by mouth every 6 (six) hours as needed for pain.      [name]. albuterol (PROAIR HFA) 108 (90 BASE) MCG/ACT inhaler Inhale 2 puffs into the lungs every 6 (six) hours as needed  for shortness of breath.       . calcium carbonate (OS-CAL) 600 MG TABS tablet Take 600 mg by mouth daily with breakfast.      . Cholecalciferol (VITAMIN D) 2000 UNITS tablet Take 2,000 Units by mouth daily.      . clobetasol (TEMOVATE) 0.05 % external solution Apply 1 application topically 2 (two) times daily.       [name]. docusate sodium (COLACE) 100 MG capsule Take 100 mg by mouth 2 (two) times daily.      [name]. doxycycline (VIBRA-TABS) 100 MG tablet Take 100 mg by mouth 2 (two) times daily.      [name]. dronedarone (MULTAQ) 400 MG tablet Take 1 tablet (400 mg total) by mouth 2 (two) times daily with a meal.  60 tablet  6  . fish oil-omega-3 fatty acids 1000 MG capsule Take 1 g by mouth daily.      . Glucosamine-Chondroit-Vit C-Mn (GLUCOSAMINE CHONDR 1500 COMPLX) CAPS Take 1 capsule by mouth daily.      . hydrocortisone 2.5 % cream Apply 1 application topically 2 (two) times daily.       [name]. levothyroxine (SYNTHROID, LEVOTHROID) 125 MCG tablet Take 125 mcg by mouth daily.        . methylcellulose (ARTIFICIAL TEARS) 1 % ophthalmic solution Place 1 drop into both eyes as needed (for dry eyes).      . Multiple Vitamins-Minerals (ICAPS) CAPS Take 1 capsule by mouth daily.      .[name]  nortriptyline (PAMELOR) 75 MG capsule Take 75 mg by mouth at bedtime.        . potassium chloride SA (K-DUR,KLOR-CON) 20 MEQ tablet Take 1 tablet (20 mEq total) by mouth daily.  30 tablet  11  . psyllium (METAMUCIL) 58.6 % packet Take 1 packet by mouth daily as needed (constipation).       . Rivaroxaban (XARELTO) 20 MG TABS tablet Take 20 mg by mouth daily with supper.       . tiotropium (SPIRIVA) 18 MCG inhalation capsule Place 18 mcg into inhaler and inhale daily.       No current facility-administered medications for this visit.    Allergies  Allergen Reactions  . Metoprolol Shortness Of Breath  . Crestor [Rosuvastatin Calcium] Other (See Comments)    myalgia  . Lipitor [Atorvastatin Calcium] Other (See Comments)    myalgia  .  Livalo [Pitavastatin Calcium] Other (See Comments)    myalgia  . Other Other (See Comments)    All CHOLESTEROL MEDS-cause myalgia  . Zetia [Ezetimibe] Other (See Comments)    myalgia  . Lisinopril Cough  . Penicillins     Rash   . Statins Other (See Comments)    myalgia  . Eliquis [Apixaban]     Rash.    History   Social History  . Marital Status: Married    Spouse Name: N/A    Number of Children: N/A  . Years of Education: N/A   Occupational History  . Retired    Social History Main Topics  . Smoking status: Never Smoker   . Smokeless tobacco: Not on file  . Alcohol Use: No  . Drug Use: No  . Sexual Activity: Not Currently   Other Topics Concern  . Not on file   Social History Narrative  . No narrative on file    Family History  Problem Relation Age of Onset  . Heart failure Mother     ROS-  All systems are reviewed and are negative except as outlined in the HPI above  Physical Exam: Filed Vitals:   08/05/13 1406  BP: 170/82  Pulse: 60  Height: 5\' 6"  (1.676 m)  Weight: 159 lb (72.122 kg)    GEN- The patient is elderly appearing, alert and oriented x 3 today.   Head- normocephalic, atraumatic Eyes-  Sclera clear, conjunctiva pink Ears- hearing intact Oropharynx- clear Neck- supple, no JVP Lymph- no cervical lymphadenopathy Lungs- Clear to ausculation bilaterally, normal work of breathing Heart- Regular rate and rhythm, no murmurs, rubs or gallops, PMI not laterally displaced GI- soft, NT, ND, + BS Extremities- no clubbing, cyanosis, or edema MS- no significant deformity or atrophy Skin- no rash or lesion Psych- euthymic mood, full affect Neuro- strength and sensation are intact  ekg today reveals sinus rhythm 60 bpm, otherwise normal ekg Event monitor is reviewed which reveals sinus without prolonged pauses or sustained ventricular arrhythmias  Assessment and Plan:  1. Syncope No recurrence off of flecainide No driving x 6 months (pt  [place]) No further workup planned  2. afib Well controlled with multaq Check lfts, bmet, and CBC today Continue xarelto for stroke prevention  3. htn Elevated today Repeat by MD is 168/80.  She has her BP journal today which I have reviewed.  This reveals mostly SBP< 100 bpm.  Her BP control at home is very good.  I would therefore not advise changes today  Follow-up with Dr Elease Hashimoto in 3 months I will see as needed going  forward.

## 2013-08-17 ENCOUNTER — Other Ambulatory Visit: Payer: Self-pay | Admitting: *Deleted

## 2013-08-17 ENCOUNTER — Telehealth: Payer: Self-pay | Admitting: Internal Medicine

## 2013-08-17 DIAGNOSIS — I10 Essential (primary) hypertension: Secondary | ICD-10-CM

## 2013-08-17 NOTE — Telephone Encounter (Signed)
Informed patient of results/KLanier,RN  

## 2013-08-17 NOTE — Telephone Encounter (Signed)
Patient is returning your call, please call and advise.  °

## 2013-08-23 ENCOUNTER — Other Ambulatory Visit (INDEPENDENT_AMBULATORY_CARE_PROVIDER_SITE_OTHER): Payer: Medicare Other

## 2013-08-23 DIAGNOSIS — I4891 Unspecified atrial fibrillation: Secondary | ICD-10-CM

## 2013-08-23 DIAGNOSIS — I10 Essential (primary) hypertension: Secondary | ICD-10-CM

## 2013-08-23 LAB — BASIC METABOLIC PANEL
BUN: 17 mg/dL (ref 6–23)
CALCIUM: 9 mg/dL (ref 8.4–10.5)
CHLORIDE: 104 meq/L (ref 96–112)
CO2: 29 meq/L (ref 19–32)
CREATININE: 1.2 mg/dL (ref 0.4–1.2)
GFR: 47.89 mL/min — ABNORMAL LOW (ref >60.00)
Glucose, Bld: 92 mg/dL (ref 70–99)
Potassium: 4.5 mEq/L (ref 3.5–5.1)
Sodium: 138 mEq/L (ref 135–145)

## 2013-08-24 ENCOUNTER — Other Ambulatory Visit: Payer: Medicare Other

## 2013-11-08 ENCOUNTER — Ambulatory Visit (INDEPENDENT_AMBULATORY_CARE_PROVIDER_SITE_OTHER): Payer: Medicare Other | Admitting: Cardiovascular Disease

## 2013-11-08 ENCOUNTER — Encounter: Payer: Self-pay | Admitting: Nurse Practitioner

## 2013-11-08 ENCOUNTER — Encounter: Payer: Self-pay | Admitting: Cardiovascular Disease

## 2013-11-08 VITALS — BP 160/84 | HR 62 | Ht 66.0 in | Wt 160.8 lb

## 2013-11-08 DIAGNOSIS — I498 Other specified cardiac arrhythmias: Secondary | ICD-10-CM

## 2013-11-08 DIAGNOSIS — I4819 Other persistent atrial fibrillation: Secondary | ICD-10-CM

## 2013-11-08 DIAGNOSIS — R55 Syncope and collapse: Secondary | ICD-10-CM

## 2013-11-08 DIAGNOSIS — R001 Bradycardia, unspecified: Secondary | ICD-10-CM

## 2013-11-08 DIAGNOSIS — I4891 Unspecified atrial fibrillation: Secondary | ICD-10-CM

## 2013-11-08 DIAGNOSIS — I480 Paroxysmal atrial fibrillation: Secondary | ICD-10-CM

## 2013-11-08 DIAGNOSIS — I10 Essential (primary) hypertension: Secondary | ICD-10-CM

## 2013-11-08 DIAGNOSIS — E7800 Pure hypercholesterolemia, unspecified: Secondary | ICD-10-CM

## 2013-11-08 LAB — BASIC METABOLIC PANEL
BUN: 18 mg/dL (ref 6–23)
CO2: 27 mEq/L (ref 19–32)
CREATININE: 1.3 mg/dL — AB (ref 0.4–1.2)
Calcium: 9.2 mg/dL (ref 8.4–10.5)
Chloride: 104 mEq/L (ref 96–112)
GFR: 43.91 mL/min — AB (ref >60.00)
GLUCOSE: 78 mg/dL (ref 70–99)
Potassium: 4.5 mEq/L (ref 3.5–5.1)
Sodium: 139 mEq/L (ref 135–145)

## 2013-11-08 LAB — LIPID PANEL
Cholesterol: 184 mg/dL (ref 0–200)
HDL: 58.1 mg/dL (ref >39.00)
LDL CALC: 101 mg/dL — AB (ref 0–99)
NonHDL: 125.9
TRIGLYCERIDES: 124 mg/dL (ref 0.0–149.0)
Total CHOL/HDL Ratio: 3
VLDL: 24.8 mg/dL (ref 0.0–40.0)

## 2013-11-08 LAB — HEPATIC FUNCTION PANEL
ALBUMIN: 3.7 g/dL (ref 3.5–5.2)
ALT: 16 U/L (ref 0–35)
AST: 24 U/L (ref 0–37)
Alkaline Phosphatase: 59 U/L (ref 39–117)
BILIRUBIN TOTAL: 0.8 mg/dL (ref 0.2–1.2)
Bilirubin, Direct: 0 mg/dL (ref 0.0–0.3)
Total Protein: 7.6 g/dL (ref 6.0–8.3)

## 2013-11-08 NOTE — Assessment & Plan Note (Signed)
Her bradycardia has remained stable on current medications. She's not had any further episodes of syncope.

## 2013-11-08 NOTE — Assessment & Plan Note (Signed)
She has well controlled HTN.  BP is a bit elevated today but her BP log shows that her BP has been well controlled.

## 2013-11-08 NOTE — Progress Notes (Signed)
Angela Blackburn Date of Birth  [birth year] [facility] [address]    [address] [place], [place]  [address] [phone]  Fax  [phone]  Problem list: 1. Hypertension 2. History chest pains-negative stress Myoview study in November, 2009 3. Hypothyroidism 4. Hypercholesterolemia 5. Peripheral neuropathy 6. Atrial fibrillation-currently on his Xarelto to prevent thromboembolus. 7. Hx of syncope - May 2015.     History of Present Illness:  Angela Blackburn is a 67 her old female with a history of hypertension, hypercholesterolemia and a chest pain. She's had a negative stress test in November 2009. She also has a history of hypothyroidism.  She was diagnosed as having a peripheral neuropathy recently.  She's been placed on some medication which seems to be helping some.  She's not eating quite as much exercise as she would like.  She's had a little bit of shortness breath with exertion.    Oct. 8, 2013 -  She checks her blood pressure at home periodically.  Her readings are on the low side.   She has been cutting back on her salt.  She's been having some episodes of jaw pain with radiation  across her chest and into her shoulders. This typically occurs at night when she's resting. He typically does not occur with exertion. It lasts for about 5 minutes.   June 02, 2012:  She has complained of some chest discomfort when I last saw her  in October. A stress Myoview study was normal.  She has normal left ventricular function and no ischemia.  She has continued to have some jaw / gum pain which then goes to her chest .  It typically occurs at night when she is in bed.  Never occurs with exercise.  She tries to exercise regularly ( water aerobics and walking)   Nov. 17, 2014:  She has had some chest pains earlier this year.  She still has some jaw pain.  Myoview study was normal.  She keeps her BP readings - all of her home readings are normal.   She had a cardioversion last week -  was successful.  Unfortunately, she has already gone back into A-Fib.  She cannot tell whether her rhythm is normal or in AFib.   She does have some leg swelling  May 18, 2013:  Angela Blackburn has not been doing as well.  She has some shoulder blade pain - occurs with exertion.  Better with rest or tylenol.  Not associated with dyspnea or diaphoresis.    She was on Eliquis but she developed a rash.  She is now on Xarelto which she tolerates well.   June 03, 2013:  We'll try her on metoprolol at her last visit but she became short of breath. We substituted diltiazem for the metoprolol.  She does not think she is any better.    Sept. 8, 2015:  Angela Blackburn is doing well.  She brought her blood pressure log from home. All of her blood pressure readings are in the normal range.. She had  several episodes of syncope back in May, 2015. She has been restricted from driving until November, 1308.  She's not had any recent episodes of syncope.  Current Outpatient Prescriptions on File Prior to Visit  Medication Sig Dispense Refill  . acetaminophen (TYLENOL) 500 MG tablet Take 1,000 mg by mouth every 6 (six) hours as needed for pain.      [name] albuterol (PROAIR HFA) 108 (90 BASE) MCG/ACT inhaler Inhale 2 puffs into the lungs  every 6 (six) hours as needed for shortness of breath.       . calcium carbonate (OS-CAL) 600 MG TABS tablet Take 600 mg by mouth daily with breakfast.      . Cholecalciferol (VITAMIN D) 2000 UNITS tablet Take 2,000 Units by mouth daily.      . clobetasol (TEMOVATE) 0.05 % external solution Apply 1 application topically 2 (two) times daily.       [name] docusate sodium (COLACE) 100 MG capsule Take 100 mg by mouth 2 (two) times daily.      [name] dronedarone (MULTAQ) 400 MG tablet Take 1 tablet (400 mg total) by mouth 2 (two) times daily with a meal.  60 tablet  6  . fish oil-omega-3 fatty acids 1000 MG capsule Take 1 g by mouth daily.      . Glucosamine-Chondroit-Vit C-Mn (GLUCOSAMINE CHONDR 1500  COMPLX) CAPS Take 1 capsule by mouth daily.      . hydrocortisone 2.5 % cream Apply 1 application topically 2 (two) times daily.       [name] levothyroxine (SYNTHROID, LEVOTHROID) 125 MCG tablet Take 125 mcg by mouth daily.        . methylcellulose (ARTIFICIAL TEARS) 1 % ophthalmic solution Place 1 drop into both eyes as needed (for dry eyes).      . Multiple Vitamins-Minerals (ICAPS) CAPS Take 1 capsule by mouth daily.      . nortriptyline (PAMELOR) 75 MG capsule Take 75 mg by mouth at bedtime.        . potassium chloride SA (K-DUR,KLOR-CON) 20 MEQ tablet Take 1 tablet (20 mEq total) by mouth daily.  30 tablet  11  . psyllium (METAMUCIL) 58.6 % packet Take 1 packet by mouth daily as needed (constipation).       . Rivaroxaban (XARELTO) 20 MG TABS tablet Take 20 mg by mouth daily with supper.       . tiotropium (SPIRIVA) 18 MCG inhalation capsule Place 18 mcg into inhaler and inhale daily.       No current facility-administered medications on file prior to visit.    Allergies  Allergen Reactions  . Metoprolol Shortness Of Breath  . Crestor [Rosuvastatin Calcium] Other (See Comments)    myalgia  . Lipitor [Atorvastatin Calcium] Other (See Comments)    myalgia  . Livalo [Pitavastatin Calcium] Other (See Comments)    myalgia  . Other Other (See Comments)    All CHOLESTEROL MEDS-cause myalgia  . Zetia [Ezetimibe] Other (See Comments)    myalgia  . Lisinopril Cough  . Penicillins     Rash   . Statins Other (See Comments)    myalgia  . Eliquis [Apixaban]     Rash.    Past Medical History  Diagnosis Date  . Hypertension   . Chest pain   . Hypothyroidism   . Hyperlipidemia   . Knee pain   . Peripheral neuropathy   . Fibromyalgia   . COPD (chronic obstructive pulmonary disease)   . PAF (paroxysmal atrial fibrillation)     a. 06/2012 Flecainide and Xarelto started;  b. 06/2012 DCCV;  c. 06/2012 Syncope in setting of bradycardia->flecainide and dilt d/c'd->Multaq started.  . Syncope      a. 06/2012 - in setting of bradycardia following dccv->flecainide and dilt d/c'd.    Past Surgical History  Procedure Laterality Date  . US echocardiography  09/14/2007    EF 55-60%  . Cardiovascular stress test  08/29/2009    EF 86%  . Abdominal hysterectomy    .  Joint replacement      Lt. TKA in 2010  . Cardioversion N/A 01/11/2013    Procedure: CARDIOVERSION;  Surgeon: Lewayne Bunting, MD;  Location: [facility] ENDOSCOPY;  Service: Cardiovascular;  Laterality: N/A;  . Cardioversion N/A 06/22/2013    Procedure: CARDIOVERSION;  Surgeon: Vesta Mixer, MD;  Location: [facility] ENDOSCOPY;  Service: Cardiovascular;  Laterality: N/A;    History  Smoking status  . Never Smoker   Smokeless tobacco  . Not on file    History  Alcohol Use No    Family History  Problem Relation Age of Onset  . Heart failure Mother     Reviw of Systems:  Reviewed in the HPI.  All other systems are negative.  Physical Exam: BP 160/84  Pulse 62  Ht  (1.676 m)  Wt 160 lb 12.8 oz (72.938 kg)  BMI 25.97 kg/m2 The patient is alert and oriented x 3.  The mood and affect are normal.   Skin: warm and dry.  Color is normal.    HEENT:   the sclera are nonicteric.  The mucous membranes are moist.  The carotids are 2+ without bruits.  There is no thyromegaly.  There is no JVD.    Lungs: clear.  The chest wall is non tender.    Heart: regular rate with a normal S1 and S2.  There are no murmurs, gallops, or rubs. The PMI is not displaced.     Abdomen: good bowel sounds.  There is no guarding or rebound.  There is no hepatosplenomegaly or tenderness.  There are no masses.   Extremities:  no clubbing, cyanosis,.  Mild 1+ edema bilaterally.  The legs are without rashes.  The distal pulses are intact.   Neuro:  Cranial nerves II - XII are intact.  Motor and sensory functions are intact.    The gait is normal.  ECG:  Assessment / Plan:

## 2013-11-08 NOTE — Patient Instructions (Addendum)
Your physician recommends that you return for lab work TODAY (BMET, LIVER, LIPID)  Your physician wants you to follow-up in: 6 MONTHS with DR. NAHSER. You will receive a reminder letter in the mail two months in advance. If you don't receive a letter, please call our office to schedule the follow-up appointment.  Your physician recommends that you continue on your current medications as directed. Please refer to the Current Medication list given to you today.

## 2013-11-08 NOTE — Assessment & Plan Note (Signed)
She has a history hyperlipidemia. We'll check her lipids today.

## 2013-11-08 NOTE — Assessment & Plan Note (Signed)
She is she has a history of atrial fibrillation but has maintained normal sinus rhythm since her cardioversion. She continues to take Xarelto to reduce the risk of thromboembolism.  She needs to have bladder surgery. We will hold the Xarelto for 3 days prior to surgery.   She should be at low risk for her upcoming surgery.

## 2013-11-08 NOTE — Assessment & Plan Note (Addendum)
Still had an episode of syncope in May, 2015. This was presumably due to bradycardia. She has been restricted from driving until November, 7829. She's not had any recent episodes of syncope.

## 2013-12-08 ENCOUNTER — Encounter (HOSPITAL_COMMUNITY): Payer: Self-pay | Admitting: Pharmacist

## 2013-12-13 NOTE — Patient Instructions (Addendum)
   Your procedure is scheduled on:  Wednesday, Oct 21  [facility] at: 7 AM Pick up the phone at the desk and dial [phone] and inform us of your arrival.  Please call this number if you have any problems the morning of surgery: [phone]  Remember: Do not eat or drink after midnight:  Tuesday Take these medicines the morning of surgery with a SIP OF WATER:  Synthroid, spiriia, dronedarone.  Bring albuterol inhaler with you on day of surgery.  Do not wear jewelry, make-up, or FINGER nail polish No metal in your hair or on your body. Do not wear lotions, powders, perfumes.  You may wear deodorant.  Do not bring valuables to the hospital. Contacts, dentures or bridgework may not be worn into surgery.  Leave suitcase in the car. After Surgery it may be brought to your room. For patients being admitted to the hospital, checkout time is 11:00am the day of discharge.   Home with husband Lorelle FormosaBuddy Loiseau

## 2013-12-15 ENCOUNTER — Encounter (HOSPITAL_COMMUNITY): Payer: Self-pay

## 2013-12-15 ENCOUNTER — Encounter (HOSPITAL_COMMUNITY)
Admission: RE | Admit: 2013-12-15 | Discharge: 2013-12-15 | Disposition: A | Discharge status: Home / Self Care | Payer: Medicare Other | Source: Ambulatory Visit | Attending: Obstetrics and Gynecology | Admitting: Obstetrics and Gynecology

## 2013-12-15 DIAGNOSIS — R42 Dizziness and giddiness: Secondary | ICD-10-CM | POA: Insufficient documentation

## 2013-12-15 DIAGNOSIS — I480 Paroxysmal atrial fibrillation: Secondary | ICD-10-CM | POA: Diagnosis not present

## 2013-12-15 DIAGNOSIS — N816 Rectocele: Secondary | ICD-10-CM | POA: Diagnosis not present

## 2013-12-15 DIAGNOSIS — R609 Edema, unspecified: Secondary | ICD-10-CM | POA: Diagnosis not present

## 2013-12-15 DIAGNOSIS — E871 Hypo-osmolality and hyponatremia: Secondary | ICD-10-CM | POA: Insufficient documentation

## 2013-12-15 DIAGNOSIS — I10 Essential (primary) hypertension: Secondary | ICD-10-CM | POA: Insufficient documentation

## 2013-12-15 DIAGNOSIS — R0789 Other chest pain: Secondary | ICD-10-CM | POA: Insufficient documentation

## 2013-12-15 DIAGNOSIS — Z0000 Encounter for general adult medical examination without abnormal findings: Secondary | ICD-10-CM | POA: Diagnosis not present

## 2013-12-15 DIAGNOSIS — R001 Bradycardia, unspecified: Secondary | ICD-10-CM | POA: Insufficient documentation

## 2013-12-15 DIAGNOSIS — N8110 Cystocele, unspecified: Secondary | ICD-10-CM | POA: Diagnosis not present

## 2013-12-15 DIAGNOSIS — R55 Syncope and collapse: Secondary | ICD-10-CM | POA: Diagnosis not present

## 2013-12-15 DIAGNOSIS — E876 Hypokalemia: Secondary | ICD-10-CM | POA: Insufficient documentation

## 2013-12-15 DIAGNOSIS — E780 Pure hypercholesterolemia: Secondary | ICD-10-CM | POA: Diagnosis not present

## 2013-12-15 DIAGNOSIS — R0600 Dyspnea, unspecified: Secondary | ICD-10-CM | POA: Insufficient documentation

## 2013-12-15 HISTORY — DX: Unspecified osteoarthritis, unspecified site: M19.90

## 2013-12-15 LAB — COMPREHENSIVE METABOLIC PANEL
ALT: 15 U/L (ref 0–35)
ANION GAP: 11 (ref 5–15)
AST: 24 U/L (ref 0–37)
Albumin: 3.7 g/dL (ref 3.5–5.2)
Alkaline Phosphatase: 73 U/L (ref 39–117)
BILIRUBIN TOTAL: 0.5 mg/dL (ref 0.3–1.2)
BUN: 18 mg/dL (ref 6–23)
CALCIUM: 8.9 mg/dL (ref 8.4–10.5)
CO2: 27 mEq/L (ref 19–32)
CREATININE: 1.28 mg/dL — AB (ref 0.50–1.10)
Chloride: 100 mEq/L (ref 96–112)
GFR, EST AFRICAN AMERICAN: 45 mL/min — AB (ref >90)
GFR, EST NON AFRICAN AMERICAN: 39 mL/min — AB (ref >90)
GLUCOSE: 89 mg/dL (ref 70–99)
Potassium: 4.3 mEq/L (ref 3.7–5.3)
Sodium: 138 mEq/L (ref 137–147)
Total Protein: 7.8 g/dL (ref 6.0–8.3)

## 2013-12-15 LAB — CBC
HEMATOCRIT: 37.3 % (ref 36.0–46.0)
Hemoglobin: 12.4 g/dL (ref 12.0–15.0)
MCH: 32.4 pg (ref 26.0–34.0)
MCHC: 33.2 g/dL (ref 30.0–36.0)
MCV: 97.4 fL (ref 78.0–100.0)
PLATELETS: 230 10*3/uL (ref 150–400)
RBC: 3.83 MIL/uL — ABNORMAL LOW (ref 3.87–5.11)
RDW: 12.8 % (ref 11.5–15.5)
WBC: 6.1 10*3/uL (ref 4.0–10.5)

## 2013-12-15 NOTE — Pre-Procedure Instructions (Signed)
Spoke with Dr Brayton CavesFreeman Jackson regarding patient's hx, slightly elevated BP at today's PAT appt.  Patient's cardiologist is Dr Melburn PopperNasher, who she saw in 11/2013, EKG Normal, Echo normal.  Ok for surgery.  No orders given.

## 2013-12-20 NOTE — H&P (Signed)
Angela Blackburn is an 80 y.o. female, P1001, seen to evaluate cystocele. She was seen last September, had #2 ring pessary with support placed, but she removed it after 2-3 weeks due to discomfort and threw it away. Since then is having recurrent bladder infections, not able to empty bladder well during the day. She wishes to proceed with surgical repair.  Pertinent Gynecological History: OB History: G1, P1001 SVD at term   Menstrual History: No LMP recorded. Patient is postmenopausal.    Past Medical History  Diagnosis Date  . Chest pain     Hx  . Hypothyroidism   . Hyperlipidemia     no meds  . Knee pain   . Peripheral neuropathy   . Fibromyalgia   . PAF (paroxysmal atrial fibrillation)     a. 06/2012 Flecainide and Xarelto started;  b. 06/2012 DCCV;  c. 06/2012 Syncope in setting of bradycardia->flecainide and dilt d/c'd->Multaq started.  . Syncope     a. 06/2012 - in setting of bradycardia following dccv->flecainide and dilt d/c'd.  . SVD (spontaneous vaginal delivery)   . COPD (chronic obstructive pulmonary disease)     uses inhaler  . Arthritis     knees/hands  . Hypertension     no meds currently - Dr Melburn PopperNasher    Past Surgical History  Procedure Laterality Date  . [place] echocardiography  09/14/2007    EF 55-60%  . Cardiovascular stress test  08/29/2009    EF 86%  . Abdominal hysterectomy    . Joint replacement      Lt. TKA in 2010  . Cardioversion N/A 01/11/2013    Procedure: CARDIOVERSION;  Surgeon: Lewayne BuntingBrian S Crenshaw, MD;  [facility];  Service: Cardiovascular;  Laterality: N/A;  . Cardioversion N/A 06/22/2013    Procedure: CARDIOVERSION;  Surgeon: Vesta MixerPhilip J Nahser, MD;  [facility];  Service: Cardiovascular;  Laterality: N/A;  . Colonoscopy      Family History  Problem Relation Age of Onset  . Heart failure Mother     Social History:  reports that she has never smoked. She has never used smokeless tobacco. She reports that she does not drink  alcohol or use illicit drugs.  Allergies:  Allergies  Allergen Reactions  . Metoprolol Shortness Of Breath  . Crestor [Rosuvastatin Calcium] Other (See Comments)    myalgia  . Lipitor [Atorvastatin Calcium] Other (See Comments)    myalgia  . Livalo [Pitavastatin Calcium] Other (See Comments)    myalgia  . Other Other (See Comments)    All CHOLESTEROL MEDS-cause myalgia  . Zetia [Ezetimibe] Other (See Comments)    myalgia  . Lisinopril Cough  . Penicillins     Rash Can tolerate Amoxicillin well.   . Statins Other (See Comments)    myalgia  . Eliquis [Apixaban]     Rash.    No prescriptions prior to admission    Review of Systems  Respiratory: Negative.   Cardiovascular: Negative.   Gastrointestinal: Negative.   Genitourinary: Positive for frequency.       Incomplete emptying Frequent UTI    There were no vitals taken for this visit. Physical Exam  Constitutional: She appears well-developed and well-nourished.  Cardiovascular: Normal rate, regular rhythm and normal heart sounds.   No murmur heard. Respiratory: Effort normal and breath sounds normal. No respiratory distress. She has no wheezes.  GI: Soft. She exhibits no distension and no mass. There is no tenderness.  Genitourinary:  Uterus and cervix absent Gr II-III cystocele  Gr I rectocele    No results found for this or any previous visit (from the past 24 hour(s)).  No results found.  Assessment/Plan: Symptomatic cystocele with incomplete bladder emptying and recurrent UTI, small rectocele as well.  She tried pessary and did not like it, wants surgical repair.  Discussed surgical procedure and risks, as well as chances of relieving symptoms.  She has medical clearance from her PCP and cardiologist, stopped her Xarelto 3 days prior to surgery.  Will admit for A&P repair.    Rose Hegner D 12/20/2013, 9:43 PM

## 2013-12-21 ENCOUNTER — Encounter (HOSPITAL_COMMUNITY): Payer: Medicare Other | Admitting: Anesthesiology

## 2013-12-21 ENCOUNTER — Ambulatory Visit (HOSPITAL_COMMUNITY)
Admission: RE | Admit: 2013-12-21 | Discharge: 2013-12-22 | Disposition: A | Discharge status: Home / Self Care | Payer: Medicare Other | Source: Ambulatory Visit | Attending: Obstetrics and Gynecology | Admitting: Obstetrics and Gynecology

## 2013-12-21 ENCOUNTER — Encounter (HOSPITAL_COMMUNITY)
Admission: RE | Disposition: A | Discharge status: Home / Self Care | Payer: Self-pay | Source: Ambulatory Visit | Attending: Obstetrics and Gynecology

## 2013-12-21 ENCOUNTER — Encounter (HOSPITAL_COMMUNITY): Payer: Self-pay | Admitting: *Deleted

## 2013-12-21 ENCOUNTER — Ambulatory Visit (HOSPITAL_COMMUNITY): Payer: Medicare Other | Admitting: Anesthesiology

## 2013-12-21 DIAGNOSIS — N390 Urinary tract infection, site not specified: Secondary | ICD-10-CM | POA: Insufficient documentation

## 2013-12-21 DIAGNOSIS — I480 Paroxysmal atrial fibrillation: Secondary | ICD-10-CM | POA: Diagnosis not present

## 2013-12-21 DIAGNOSIS — R338 Other retention of urine: Secondary | ICD-10-CM | POA: Insufficient documentation

## 2013-12-21 DIAGNOSIS — M797 Fibromyalgia: Secondary | ICD-10-CM | POA: Diagnosis not present

## 2013-12-21 DIAGNOSIS — E039 Hypothyroidism, unspecified: Secondary | ICD-10-CM | POA: Insufficient documentation

## 2013-12-21 DIAGNOSIS — Z880 Allergy status to penicillin: Secondary | ICD-10-CM | POA: Insufficient documentation

## 2013-12-21 DIAGNOSIS — Z888 Allergy status to other drugs, medicaments and biological substances status: Secondary | ICD-10-CM | POA: Diagnosis not present

## 2013-12-21 DIAGNOSIS — I10 Essential (primary) hypertension: Secondary | ICD-10-CM | POA: Insufficient documentation

## 2013-12-21 DIAGNOSIS — M1389 Other specified arthritis, multiple sites: Secondary | ICD-10-CM | POA: Diagnosis not present

## 2013-12-21 DIAGNOSIS — E785 Hyperlipidemia, unspecified: Secondary | ICD-10-CM | POA: Insufficient documentation

## 2013-12-21 DIAGNOSIS — N8110 Cystocele, unspecified: Secondary | ICD-10-CM | POA: Diagnosis not present

## 2013-12-21 DIAGNOSIS — N816 Rectocele: Secondary | ICD-10-CM | POA: Insufficient documentation

## 2013-12-21 DIAGNOSIS — G629 Polyneuropathy, unspecified: Secondary | ICD-10-CM | POA: Insufficient documentation

## 2013-12-21 DIAGNOSIS — J449 Chronic obstructive pulmonary disease, unspecified: Secondary | ICD-10-CM | POA: Insufficient documentation

## 2013-12-21 HISTORY — PX: ANTERIOR AND POSTERIOR REPAIR: SHX5121

## 2013-12-21 SURGERY — ANTERIOR (CYSTOCELE) AND POSTERIOR REPAIR (RECTOCELE)
Anesthesia: General | Site: Vagina

## 2013-12-21 MED ORDER — METOCLOPRAMIDE HCL 5 MG/ML IJ SOLN
INTRAMUSCULAR | Status: AC
  Filled 2013-12-21: qty 2

## 2013-12-21 MED ORDER — METOCLOPRAMIDE HCL 5 MG/ML IJ SOLN
INTRAMUSCULAR | Status: DC | PRN
  Administered 2013-12-21: 5 mg via INTRAVENOUS

## 2013-12-21 MED ORDER — HEPARIN SODIUM (PORCINE) 5000 UNIT/ML IJ SOLN
5000.0000 [IU] | Freq: Once | INTRAMUSCULAR | Status: AC
  Administered 2013-12-21: 5000 [IU] via SUBCUTANEOUS
  Filled 2013-12-21: qty 1

## 2013-12-21 MED ORDER — OXYCODONE-ACETAMINOPHEN 5-325 MG PO TABS
1.0000 | ORAL_TABLET | ORAL | Status: DC | PRN
  Administered 2013-12-22: 1 via ORAL
  Filled 2013-12-21: qty 1

## 2013-12-21 MED ORDER — PROPOFOL 10 MG/ML IV EMUL
INTRAVENOUS | Status: AC
  Filled 2013-12-21: qty 20

## 2013-12-21 MED ORDER — LEVOTHYROXINE SODIUM 125 MCG PO TABS
125.0000 ug | ORAL_TABLET | Freq: Every day | ORAL | Status: DC
  Administered 2013-12-22: 125 ug via ORAL
  Filled 2013-12-21 (×2): qty 1

## 2013-12-21 MED ORDER — KETOROLAC TROMETHAMINE 30 MG/ML IJ SOLN
30.0000 mg | Freq: Once | INTRAMUSCULAR | Status: DC
  Administered 2013-12-21: 15 mg via INTRAVENOUS

## 2013-12-21 MED ORDER — IBUPROFEN 600 MG PO TABS
600.0000 mg | ORAL_TABLET | Freq: Four times a day (QID) | ORAL | Status: DC | PRN
  Administered 2013-12-21 (×2): 600 mg via ORAL
  Filled 2013-12-21 (×2): qty 1

## 2013-12-21 MED ORDER — POTASSIUM CHLORIDE CRYS ER 20 MEQ PO TBCR
20.0000 meq | EXTENDED_RELEASE_TABLET | Freq: Every day | ORAL | Status: DC
  Administered 2013-12-22: 20 meq via ORAL
  Filled 2013-12-21 (×3): qty 1

## 2013-12-21 MED ORDER — CEFAZOLIN SODIUM-DEXTROSE 2-3 GM-% IV SOLR
INTRAVENOUS | Status: AC
  Administered 2013-12-21: 2 g via INTRAVENOUS
  Filled 2013-12-21: qty 50

## 2013-12-21 MED ORDER — ONDANSETRON HCL 4 MG/2ML IJ SOLN: 4.0000 mg | Freq: Four times a day (QID) | INTRAMUSCULAR | Status: DC | PRN

## 2013-12-21 MED ORDER — DOCUSATE SODIUM 100 MG PO CAPS
200.0000 mg | ORAL_CAPSULE | Freq: Every day | ORAL | Status: DC
  Administered 2013-12-22: 200 mg via ORAL
  Filled 2013-12-21: qty 2

## 2013-12-21 MED ORDER — DIPHENHYDRAMINE HCL 50 MG/ML IJ SOLN
INTRAMUSCULAR | Status: DC | PRN
  Administered 2013-12-21: 12.5 mg via INTRAVENOUS

## 2013-12-21 MED ORDER — CEFAZOLIN SODIUM-DEXTROSE 2-3 GM-% IV SOLR
2.0000 g | INTRAVENOUS | Status: AC
  Filled 2013-12-21: qty 50

## 2013-12-21 MED ORDER — ALUM & MAG HYDROXIDE-SIMETH 200-200-20 MG/5ML PO SUSP: 30.0000 mL | ORAL | Status: DC | PRN

## 2013-12-21 MED ORDER — DEXTROSE-NACL 5-0.45 % IV SOLN
INTRAVENOUS | Status: DC
  Administered 2013-12-21 (×2): via INTRAVENOUS

## 2013-12-21 MED ORDER — FENTANYL CITRATE 0.05 MG/ML IJ SOLN
INTRAMUSCULAR | Status: AC
  Filled 2013-12-21: qty 2

## 2013-12-21 MED ORDER — METOCLOPRAMIDE HCL 5 MG/ML IJ SOLN: 10.0000 mg | Freq: Once | INTRAMUSCULAR | Status: DC | PRN

## 2013-12-21 MED ORDER — FENTANYL CITRATE 0.05 MG/ML IJ SOLN: 25.0000 ug | INTRAMUSCULAR | Status: DC | PRN

## 2013-12-21 MED ORDER — KETOROLAC TROMETHAMINE 30 MG/ML IJ SOLN: 30.0000 mg | Freq: Four times a day (QID) | INTRAMUSCULAR | Status: DC

## 2013-12-21 MED ORDER — DRONEDARONE HCL 400 MG PO TABS
400.0000 mg | ORAL_TABLET | Freq: Two times a day (BID) | ORAL | Status: DC
  Administered 2013-12-21 – 2013-12-22 (×2): 400 mg via ORAL
  Filled 2013-12-21 (×5): qty 1

## 2013-12-21 MED ORDER — 0.9 % SODIUM CHLORIDE (POUR BTL) OPTIME
TOPICAL | Status: DC | PRN
  Administered 2013-12-21: 1000 mL

## 2013-12-21 MED ORDER — DIPHENHYDRAMINE HCL 50 MG/ML IJ SOLN
INTRAMUSCULAR | Status: AC
  Filled 2013-12-21: qty 1

## 2013-12-21 MED ORDER — TIOTROPIUM BROMIDE MONOHYDRATE 18 MCG IN CAPS
18.0000 ug | ORAL_CAPSULE | Freq: Every day | RESPIRATORY_TRACT | Status: DC
  Administered 2013-12-22: 18 ug via RESPIRATORY_TRACT
  Filled 2013-12-21: qty 5

## 2013-12-21 MED ORDER — ONDANSETRON HCL 4 MG/2ML IJ SOLN
INTRAMUSCULAR | Status: AC
  Filled 2013-12-21: qty 2

## 2013-12-21 MED ORDER — LIDOCAINE HCL (CARDIAC) 20 MG/ML IV SOLN
INTRAVENOUS | Status: DC | PRN
  Administered 2013-12-21: 50 mg via INTRAVENOUS

## 2013-12-21 MED ORDER — DEXAMETHASONE SODIUM PHOSPHATE 10 MG/ML IJ SOLN
INTRAMUSCULAR | Status: DC | PRN
  Administered 2013-12-21: 4 mg via INTRAVENOUS

## 2013-12-21 MED ORDER — ALBUTEROL SULFATE (2.5 MG/3ML) 0.083% IN NEBU: INHALATION_SOLUTION | Freq: Four times a day (QID) | RESPIRATORY_TRACT | Status: DC | PRN

## 2013-12-21 MED ORDER — LIDOCAINE HCL (CARDIAC) 20 MG/ML IV SOLN
INTRAVENOUS | Status: AC
  Filled 2013-12-21: qty 5

## 2013-12-21 MED ORDER — FENTANYL CITRATE 0.05 MG/ML IJ SOLN
INTRAMUSCULAR | Status: DC | PRN
  Administered 2013-12-21 (×4): 50 ug via INTRAVENOUS

## 2013-12-21 MED ORDER — ONDANSETRON HCL 4 MG/2ML IJ SOLN
INTRAMUSCULAR | Status: DC | PRN
  Administered 2013-12-21: 4 mg via INTRAVENOUS

## 2013-12-21 MED ORDER — DEXAMETHASONE SODIUM PHOSPHATE 4 MG/ML IJ SOLN
INTRAMUSCULAR | Status: AC
  Filled 2013-12-21: qty 1

## 2013-12-21 MED ORDER — MENTHOL 3 MG MT LOZG: 1.0000 | LOZENGE | OROMUCOSAL | Status: DC | PRN

## 2013-12-21 MED ORDER — ESTRADIOL 0.1 MG/GM VA CREA
TOPICAL_CREAM | VAGINAL | Status: AC
  Filled 2013-12-21: qty 42.5

## 2013-12-21 MED ORDER — SIMETHICONE 80 MG PO CHEW: 80.0000 mg | CHEWABLE_TABLET | Freq: Four times a day (QID) | ORAL | Status: DC | PRN

## 2013-12-21 MED ORDER — KETOROLAC TROMETHAMINE 30 MG/ML IJ SOLN
INTRAMUSCULAR | Status: AC
  Filled 2013-12-21: qty 1

## 2013-12-21 MED ORDER — NORTRIPTYLINE HCL 25 MG PO CAPS
75.0000 mg | ORAL_CAPSULE | Freq: Every day | ORAL | Status: DC
  Administered 2013-12-21: 75 mg via ORAL
  Filled 2013-12-21 (×3): qty 3

## 2013-12-21 MED ORDER — LACTATED RINGERS IV SOLN
INTRAVENOUS | Status: DC
  Administered 2013-12-21 (×2): via INTRAVENOUS

## 2013-12-21 MED ORDER — KETOROLAC TROMETHAMINE 15 MG/ML IJ SOLN
15.0000 mg | Freq: Once | INTRAMUSCULAR | Status: DC
  Filled 2013-12-21: qty 1

## 2013-12-21 MED ORDER — LABETALOL HCL 5 MG/ML IV SOLN
10.0000 mg | Freq: Once | INTRAVENOUS | Status: AC
  Administered 2013-12-21: 10 mg via INTRAVENOUS
  Filled 2013-12-21: qty 4

## 2013-12-21 MED ORDER — RIVAROXABAN 20 MG PO TABS
20.0000 mg | ORAL_TABLET | Freq: Every day | ORAL | Status: DC
  Administered 2013-12-21: 20 mg via ORAL
  Filled 2013-12-21 (×2): qty 1

## 2013-12-21 MED ORDER — ONDANSETRON HCL 4 MG PO TABS: 4.0000 mg | ORAL_TABLET | Freq: Four times a day (QID) | ORAL | Status: DC | PRN

## 2013-12-21 MED ORDER — PROPOFOL 10 MG/ML IV BOLUS
INTRAVENOUS | Status: DC | PRN
  Administered 2013-12-21 (×2): 50 mg via INTRAVENOUS

## 2013-12-21 MED ORDER — ESTRADIOL 0.1 MG/GM VA CREA
TOPICAL_CREAM | VAGINAL | Status: DC | PRN
  Administered 2013-12-21: 1 via VAGINAL

## 2013-12-21 SURGICAL SUPPLY — 28 items
BLADE 15 SAFETY STRL DISP (BLADE) IMPLANT
CATH ROBINSON RED A/P 16FR (CATHETERS) ×2 IMPLANT
CLOTH BEACON ORANGE TIMEOUT ST (SAFETY) ×3 IMPLANT
CONT PATH 16OZ SNAP LID 3702 (MISCELLANEOUS) IMPLANT
DECANTER SPIKE VIAL GLASS SM (MISCELLANEOUS) IMPLANT
DRAPE HYSTEROSCOPY (DRAPE) ×3 IMPLANT
GAUZE PACKING 2X5 YD STRL (GAUZE/BANDAGES/DRESSINGS) ×3 IMPLANT
GLOVE BIO SURGEON STRL SZ8 (GLOVE) ×3 IMPLANT
GLOVE BIOGEL PI IND STRL 6.5 (GLOVE) ×1 IMPLANT
GLOVE BIOGEL PI INDICATOR 6.5 (GLOVE) ×2
GLOVE ORTHO TXT STRL SZ7.5 (GLOVE) ×3 IMPLANT
GOWN STRL REUS W/TWL LRG LVL3 (GOWN DISPOSABLE) ×12 IMPLANT
NEEDLE HYPO 22GX1.5 SAFETY (NEEDLE) IMPLANT
NEEDLE MAYO .5 CIRCLE (NEEDLE) IMPLANT
NS IRRIG 1000ML POUR BTL (IV SOLUTION) ×3 IMPLANT
PACK VAGINAL WOMENS (CUSTOM PROCEDURE TRAY) ×3 IMPLANT
SUT PROLENE 1 CTX 30  8455H (SUTURE)
SUT PROLENE 1 CTX 30 8455H (SUTURE) IMPLANT
SUT SILK 0 SH 30 (SUTURE) ×3 IMPLANT
SUT VIC AB 2-0 CT2 27 (SUTURE) ×12 IMPLANT
SUT VIC AB 3-0 CT1 27 (SUTURE)
SUT VIC AB 3-0 CT1 TAPERPNT 27 (SUTURE) IMPLANT
SUT VIC AB 3-0 SH 27 (SUTURE)
SUT VIC AB 3-0 SH 27X BRD (SUTURE) IMPLANT
SUT VICRYL 0 UR6 27IN ABS (SUTURE) IMPLANT
TOWEL OR 17X24 6PK STRL BLUE (TOWEL DISPOSABLE) ×6 IMPLANT
TRAY FOLEY CATH 14FR (SET/KITS/TRAYS/PACK) ×3 IMPLANT
WATER STERILE IRR 1000ML POUR (IV SOLUTION) ×1 IMPLANT

## 2013-12-21 NOTE — Progress Notes (Signed)
Called Dr Jackelyn KnifeMeisinger regarding patient's PCN allergy and jis order of Ancef.  PCN allergy is rash and patient states can tolerate amoxicillin.  Telephone order - ok to give Ancef antibiotic.  Verified telephone order.

## 2013-12-21 NOTE — Op Note (Signed)
Preoperative diagnosis: Symptomatic cystocele and small rectocele Postoperative diagnosis:  Same  Procedure: Anterior and posterior repair Surgeon: Lavina Hammanodd Camry Theiss M.D. Asst.: Tracey Harrieshomas Henley M.D. Anesthesia: General with LMA Findings: She had a Gr II cystocele, Gr I-II rectocele estimated blood loss: 100 cc Complications: None Specimens: None  Procedure in detail:  The patient was taken to the operating room and placed in the dorsosupine position. General anesthesia was induced, and she was then placed in mobile stirrups. Perineum and vagina were then prepped and draped in usual sterile fashion and bladder drained with a Robinson catheter. A weighted speculum was placed in the vagina. The vaginal cuff was grasped with 2 Allis clamps and a small horizontal piece of vaginal tissue was removed. The cystocele was then mobilized by dissecting anteriorly in the midline from the vaginal cuff to within about 2 cm of the urethral meatus in the midline. Cystocele was mobilized laterally. Cystocele was then reduced with several interrupted sutures of 2-0 Vicryl with adequate reduction. Excess vaginal tissue was removed and the incision was closed with running locking 2-0 Vicryl with adequate closure and adequate hemostasis.  Attention was now turned to the rectocele repair. The hymenal ring was grasped with 2 Allis clamps and a horizontal piece of tissue was removed. The posterior vaginal mucosa was then dissected in the midline from the hymenal ring to the vaginal apex. Rectocele was then mobilized bilaterally sharply and bluntly. The rectocele was then reduced with interrupted sutures of 2-0 Vicryl after a rectal exam confirmed this was all rectocele. This achieved good reduction. A repeat rectal exam confirmed good reduction of the rectocele. Excess vaginal mucosa was then removed sharply. Vagina was closed from the vaginal apex to the hymenal ring with running locking 2-0 Vicryl. This achieved adequate closure  and adequate hemostasis. The vagina was then packed with 2 inch gauze coated with Estrace cream. A Foley catheter was also placed. The patient tolerated the procedure well. She was awakened and taken to the recovery in stable condition. Counts were correct, she had PAS hose on throughout the procedure. She received Ancef 2 g for surgical prophylaxis.

## 2013-12-21 NOTE — Anesthesia Postprocedure Evaluation (Signed)
  Anesthesia Post-op Note  Patient: Angela Blackburn  Procedure(s) Performed: Procedure(s): ANTERIOR (CYSTOCELE) AND POSTERIOR REPAIR (RECTOCELE) (N/A)  Patient Location: [facility]  Anesthesia Type:General  Level of Consciousness: awake  Airway and Oxygen Therapy: Patient Spontanous Breathing  Post-op Pain: mild  Post-op Assessment: Patient's Cardiovascular Status Stable and Respiratory Function Stable  Post-op Vital Signs: stable  Last Vitals:  Filed Vitals:   12/21/13 1259  BP: 134/50  Pulse: 55  Temp:   Resp:     Complications: No apparent anesthesia complications

## 2013-12-21 NOTE — Anesthesia Postprocedure Evaluation (Signed)
  Anesthesia Post-op Note  Patient: Angela Blackburn  Procedure(s) Performed: Procedure(s): ANTERIOR (CYSTOCELE) AND POSTERIOR REPAIR (RECTOCELE) (N/A)  Patient Location: PACU  Anesthesia Type:General  Level of Consciousness: awake, alert  and oriented  Airway and Oxygen Therapy: Patient Spontanous Breathing  Post-op Pain: mild  Post-op Assessment: Post-op Vital signs reviewed, Patient's Cardiovascular Status Stable, Respiratory Function Stable, Patent Airway, No signs of Nausea or vomiting and Pain level controlled  Post-op Vital Signs: Reviewed and stable  Last Vitals:  Filed Vitals:   12/21/13 1055  BP:   Pulse: 59  Temp: 36.6 C  Resp: 19    Complications: No apparent anesthesia complications

## 2013-12-21 NOTE — Transfer of Care (Signed)
Immediate Anesthesia Transfer of Care Note  Patient: Angela Blackburn  Procedure(s) Performed: Procedure(s): ANTERIOR (CYSTOCELE) AND POSTERIOR REPAIR (RECTOCELE) (N/A)  Patient Location: PACU  Anesthesia Type:General  Level of Consciousness: awake, alert  and oriented  Airway & Oxygen Therapy: Patient Spontanous Breathing and Patient connected to nasal cannula oxygen  Post-op Assessment: Report given to PACU RN and Post -op Vital signs reviewed and stable  Post vital signs: Reviewed and stable  Complications: No apparent anesthesia complications

## 2013-12-21 NOTE — Progress Notes (Signed)
Day of Surgery Procedure(s) (LRB): ANTERIOR (CYSTOCELE) AND POSTERIOR REPAIR (RECTOCELE) (N/A)  Subjective: Patient reports feeling well.  Minimal pain.  Sitting up and eating dinner.  Wonders if needs medicine for BM.  Objective: I have reviewed patient's vital signs and intake and output.  Resting comfortably in bed Abd non-distended soft  Excellent UOP   Assessment: s/p Procedure(s): ANTERIOR (CYSTOCELE) AND POSTERIOR REPAIR (RECTOCELE) (N/A): progressing well  Plan: Pt doing very well.   No issues.  Foley out in AM and told pt to give until then before medications for bowels.  LOS: 0 days    Beverely Suen W 12/21/2013, 6:07 PM

## 2013-12-21 NOTE — Anesthesia Preprocedure Evaluation (Signed)
Anesthesia Evaluation  Patient identified by MRN, date of birth, ID band Patient awake    Reviewed: Allergy & Precautions, H&P , NPO status , Patient's Chart, lab work & pertinent test results, reviewed documented beta blocker date and time   Airway Mallampati: II TM Distance: >3 FB Neck ROM: Full    Dental no notable dental hx. (+) Teeth Intact   Pulmonary shortness of breath and with exertion, COPD COPD inhaler,  breath sounds clear to auscultation  Pulmonary exam normal       Cardiovascular hypertension, Pt. on medications + dysrhythmias Atrial Fibrillation Rhythm:Regular Rate:Normal     Neuro/Psych Peripheral neuropathy  Neuromuscular disease negative psych ROS   GI/Hepatic negative GI ROS, Neg liver ROS,   Endo/Other  Hypothyroidism Hyperlipidemia  Renal/GU negative Renal ROS   Cystocele Rectocele    Musculoskeletal  (+) Arthritis -, Osteoarthritis,  Fibromyalgia -  Abdominal Normal abdominal exam  (+) - obese,   Peds  Hematology   Anesthesia Other Findings   Reproductive/Obstetrics negative OB ROS                           Anesthesia Physical Anesthesia Plan  ASA: III  Anesthesia Plan: General   Post-op Pain Management:    Induction: Intravenous  Airway Management Planned: LMA  Additional Equipment:   Intra-op Plan:   Post-operative Plan: Extubation in OR  Informed Consent: I have reviewed the patients History and Physical, chart, labs and discussed the procedure including the risks, benefits and alternatives for the proposed anesthesia with the patient or authorized representative who has indicated his/her understanding and acceptance.   Dental advisory given  Plan Discussed with: Anesthesiologist, CRNA and Surgeon  Anesthesia Plan Comments:         Anesthesia Quick Evaluation

## 2013-12-21 NOTE — Interval H&P Note (Signed)
History and Physical Interval Note:  12/21/2013 8:11 AM  Angela ChildLucille S Patnode  has presented today for surgery, with the diagnosis of Cystocele, Rectocele, [address]7260  The various methods of treatment have been discussed with the patient and family. After consideration of risks, benefits and other options for treatment, the patient has consented to  Procedure(s): ANTERIOR (CYSTOCELE) AND POSTERIOR REPAIR (RECTOCELE) (N/A) as a surgical intervention .  The patient's history has been reviewed, patient examined, no change in status, stable for surgery.  I have reviewed the patient's chart and labs.  Questions were answered to the patient's satisfaction.     Harnoor Reta D

## 2013-12-21 NOTE — Addendum Note (Signed)
Addendum created 12/21/13 1523 by Renford DillsJanet L Marchello Rothgeb, CRNA   Modules edited: Notes Section   Notes Section:  File: [id number]

## 2013-12-22 ENCOUNTER — Encounter (HOSPITAL_COMMUNITY): Payer: Self-pay | Admitting: Obstetrics and Gynecology

## 2013-12-22 DIAGNOSIS — N8110 Cystocele, unspecified: Secondary | ICD-10-CM | POA: Diagnosis not present

## 2013-12-22 LAB — CBC
HEMATOCRIT: 32.5 % — AB (ref 36.0–46.0)
Hemoglobin: 11 g/dL — ABNORMAL LOW (ref 12.0–15.0)
MCH: 32.5 pg (ref 26.0–34.0)
MCHC: 33.8 g/dL (ref 30.0–36.0)
MCV: 96.2 fL (ref 78.0–100.0)
PLATELETS: 204 10*3/uL (ref 150–400)
RBC: 3.38 MIL/uL — ABNORMAL LOW (ref 3.87–5.11)
RDW: 12.6 % (ref 11.5–15.5)
WBC: 8.6 10*3/uL (ref 4.0–10.5)

## 2013-12-22 MED ORDER — OXYCODONE-ACETAMINOPHEN 5-325 MG PO TABS: 1.0000 | ORAL_TABLET | ORAL | Status: DC | PRN

## 2013-12-22 NOTE — Discharge Instructions (Signed)
Call for vaginal bleeding heavier than spotting, increasing pain, temp >100

## 2013-12-22 NOTE — Progress Notes (Signed)
Pt voided  And scan  No residual   Noted  Teaching  Complete   Out in wheelchair

## 2013-12-22 NOTE — Progress Notes (Signed)
1 Day Post-Op Procedure(s) (LRB): ANTERIOR (CYSTOCELE) AND POSTERIOR REPAIR (RECTOCELE) (N/A)  Subjective: Patient reports tolerating PO.    Objective: I have reviewed patient's vital signs, intake and output, medications and labs.  General: alert Vaginal Bleeding: minimal  Assessment: s/p Procedure(s): ANTERIOR (CYSTOCELE) AND POSTERIOR REPAIR (RECTOCELE) (N/A): stable, progressing well and tolerating diet  Plan: Advance diet Encourage ambulation Advance to PO medication Discontinue IV fluids Discharge home  LOS: 1 day    Angela Blackburn 12/22/2013, 8:28 AM

## 2013-12-22 NOTE — Discharge Summary (Signed)
Physician Discharge Summary  Patient ID: Angela Blackburn MRN: [id number] DOB/AGE: 78/04/1932 78 y.o.  Admit date: 12/21/2013 Discharge date: 12/22/2013  Admission Diagnoses:  Cystocele, rectocele  Discharge Diagnoses:  Cystocele, rectocele Active Problems:   Cystocele with rectocele   Discharged Condition: good  Hospital Course: Admitted for A&P repair, tolerated without problems.  No problems post-op, no unusual bleeding, able to void, Xarelto re-started night of surgery.   Discharge Exam: Blood pressure 141/54, pulse 61, temperature 97.9 F (36.6 C), temperature source Axillary, resp. rate 18, height 5\' 4"  (1.626 m), weight 73.483 kg (162 lb), SpO2 100.00%. General appearance: alert  Disposition: 01-Home or Self Care  Discharge Instructions   Diet - low sodium heart healthy    Complete by:  As directed      Increase activity slowly    Complete by:  As directed      Lifting restrictions    Complete by:  As directed   10 lbs     Sexual Activity Restrictions    Complete by:  As directed   Pelvic rest            Medication List         acetaminophen 500 MG tablet  Commonly known as:  TYLENOL  Take 1,000 mg by mouth every 6 (six) hours as needed for pain.     calcium carbonate 600 MG Tabs tablet  Commonly known as:  OS-CAL  Take 600 mg by mouth daily with breakfast.     clobetasol 0.05 % external solution  Commonly known as:  TEMOVATE  Apply 1 application topically 2 (two) times daily.     docusate sodium 100 MG capsule  Commonly known as:  COLACE  Take 200 mg by mouth daily.     dronedarone 400 MG tablet  Commonly known as:  MULTAQ  Take 1 tablet (400 mg total) by mouth 2 (two) times daily with a meal.     fish oil-omega-3 fatty acids 1000 MG capsule  Take 1 g by mouth daily.     GLUCOSAMINE CHONDR 1500 COMPLX Caps  Take 1 capsule by mouth daily.     ICAPS Caps  Take 1 capsule by mouth daily.     levothyroxine 125 MCG tablet  Commonly known as:   SYNTHROID, LEVOTHROID  Take 125 mcg by mouth daily.     methylcellulose 1 % ophthalmic solution  Commonly known as:  ARTIFICIAL TEARS  Place 1 drop into both eyes as needed (for dry eyes).     nortriptyline 75 MG capsule  Commonly known as:  PAMELOR  Take 75 mg by mouth at bedtime.     oxyCODONE-acetaminophen 5-325 MG per tablet  Commonly known as:  PERCOCET/ROXICET  Take 1-2 tablets by mouth every 4 (four) hours as needed for severe pain (moderate to severe pain (when tolerating fluids)).     potassium chloride SA 20 MEQ tablet  Commonly known as:  K-DUR,KLOR-CON  Take 1 tablet (20 mEq total) by mouth daily.     PROAIR HFA 108 (90 BASE) MCG/ACT inhaler  Generic drug:  albuterol  Inhale 2 puffs into the lungs every 6 (six) hours as needed for shortness of breath.     psyllium 58.6 % packet  Commonly known as:  METAMUCIL  Take 1 packet by mouth daily as needed (constipation).     tiotropium 18 MCG inhalation capsule  Commonly known as:  SPIRIVA  Place 18 mcg into inhaler and inhale daily.     Vitamin  D 2000 UNITS tablet  Take 2,000 Units by mouth daily.     XARELTO 20 MG Tabs tablet  Generic drug:  rivaroxaban  Take 20 mg by mouth daily with supper.           Follow-up Information   Follow up with Mahkayla Preece D, MD. Schedule an appointment as soon as possible for a visit in 2 weeks.   Specialty:  Obstetrics and Gynecology   Contact information:   [address], [address] [place] [address] [phone]       Signed: Zenaida NieceMEISINGER,Tikesha Mort D 12/22/2013, 10:10 PM

## 2014-01-09 ENCOUNTER — Other Ambulatory Visit: Payer: Self-pay | Admitting: *Deleted

## 2014-01-09 ENCOUNTER — Other Ambulatory Visit: Payer: Self-pay | Admitting: Cardiovascular Disease

## 2014-01-09 MED ORDER — DRONEDARONE HCL 400 MG PO TABS: 400.0000 mg | ORAL_TABLET | Freq: Two times a day (BID) | ORAL | Status: DC

## 2014-04-17 ENCOUNTER — Emergency Department (HOSPITAL_COMMUNITY): Payer: Medicare Other

## 2014-04-17 ENCOUNTER — Emergency Department (HOSPITAL_COMMUNITY)
Admission: EM | Admit: 2014-04-17 | Discharge: 2014-04-17 | Disposition: A | Discharge status: Home / Self Care | Payer: Medicare Other | Attending: Emergency Medicine | Admitting: Emergency Medicine

## 2014-04-17 ENCOUNTER — Encounter (HOSPITAL_COMMUNITY): Payer: Self-pay | Admitting: Emergency Medicine

## 2014-04-17 DIAGNOSIS — Z8669 Personal history of other diseases of the nervous system and sense organs: Secondary | ICD-10-CM | POA: Diagnosis not present

## 2014-04-17 DIAGNOSIS — R0789 Other chest pain: Secondary | ICD-10-CM | POA: Diagnosis not present

## 2014-04-17 DIAGNOSIS — M797 Fibromyalgia: Secondary | ICD-10-CM | POA: Insufficient documentation

## 2014-04-17 DIAGNOSIS — J449 Chronic obstructive pulmonary disease, unspecified: Secondary | ICD-10-CM | POA: Insufficient documentation

## 2014-04-17 DIAGNOSIS — I480 Paroxysmal atrial fibrillation: Secondary | ICD-10-CM | POA: Diagnosis not present

## 2014-04-17 DIAGNOSIS — E039 Hypothyroidism, unspecified: Secondary | ICD-10-CM | POA: Diagnosis not present

## 2014-04-17 DIAGNOSIS — Z7952 Long term (current) use of systemic steroids: Secondary | ICD-10-CM | POA: Insufficient documentation

## 2014-04-17 DIAGNOSIS — I10 Essential (primary) hypertension: Secondary | ICD-10-CM | POA: Insufficient documentation

## 2014-04-17 DIAGNOSIS — R079 Chest pain, unspecified: Secondary | ICD-10-CM | POA: Diagnosis present

## 2014-04-17 DIAGNOSIS — Z7901 Long term (current) use of anticoagulants: Secondary | ICD-10-CM | POA: Insufficient documentation

## 2014-04-17 DIAGNOSIS — R011 Cardiac murmur, unspecified: Secondary | ICD-10-CM | POA: Diagnosis not present

## 2014-04-17 DIAGNOSIS — M1990 Unspecified osteoarthritis, unspecified site: Secondary | ICD-10-CM | POA: Insufficient documentation

## 2014-04-17 DIAGNOSIS — Z880 Allergy status to penicillin: Secondary | ICD-10-CM | POA: Insufficient documentation

## 2014-04-17 LAB — CBC
HCT: 37.7 % (ref 36.0–46.0)
Hemoglobin: 12.5 g/dL (ref 12.0–15.0)
MCH: 30.9 pg (ref 26.0–34.0)
MCHC: 33.2 g/dL (ref 30.0–36.0)
MCV: 93.3 fL (ref 78.0–100.0)
PLATELETS: 215 10*3/uL (ref 150–400)
RBC: 4.04 MIL/uL (ref 3.87–5.11)
RDW: 13.3 % (ref 11.5–15.5)
WBC: 5.8 10*3/uL (ref 4.0–10.5)

## 2014-04-17 LAB — BASIC METABOLIC PANEL
ANION GAP: 4 — AB (ref 5–15)
BUN: 22 mg/dL (ref 6–23)
CALCIUM: 9.1 mg/dL (ref 8.4–10.5)
CO2: 29 mmol/L (ref 19–32)
Chloride: 105 mmol/L (ref 96–112)
Creatinine, Ser: 1.08 mg/dL (ref 0.50–1.10)
GFR calc Af Amer: 55 mL/min — ABNORMAL LOW (ref >90)
GFR, EST NON AFRICAN AMERICAN: 48 mL/min — AB (ref >90)
GLUCOSE: 87 mg/dL (ref 70–99)
POTASSIUM: 4 mmol/L (ref 3.5–5.1)
SODIUM: 138 mmol/L (ref 135–145)

## 2014-04-17 LAB — I-STAT TROPONIN, ED
TROPONIN I, POC: 0 ng/mL (ref 0.00–0.08)
Troponin i, poc: 0.01 ng/mL (ref 0.00–0.08)

## 2014-04-17 NOTE — ED Notes (Signed)
Spoke with MD. Patient waiting for second Troponin Draw at three hour mark.

## 2014-04-17 NOTE — ED Notes (Signed)
Phlebotomy at the bedside  

## 2014-04-17 NOTE — Discharge Instructions (Signed)
We saw you in the ER for the chest pain/shortness of breath. All of our cardiac workup is normal, including labs, EKG and chest X-RAY are normal. We are not sure what is causing your discomfort, but we feel comfortable sending you home at this time. The workup in the ER is not complete, and you should follow up with your primary care doctor for further evaluation.  Please return to the ER if your symptoms worsen; you have increased pain, fevers, chills, inability to keep any medications down, confusion. Otherwise see the outpatient doctor as requested.   Chest Pain (Nonspecific) It is often hard to give a specific diagnosis for the cause of chest pain. There is always a chance that your pain could be related to something serious, such as a heart attack or a blood clot in the lungs. You need to follow up with your health care provider for further evaluation. CAUSES   Heartburn.  Pneumonia or bronchitis.  Anxiety or stress.  Inflammation around your heart (pericarditis) or lung (pleuritis or pleurisy).  A blood clot in the lung.  A collapsed lung (pneumothorax). It can develop suddenly on its own (spontaneous pneumothorax) or from trauma to the chest.  Shingles infection (herpes zoster virus). The chest wall is composed of bones, muscles, and cartilage. Any of these can be the source of the pain.  The bones can be bruised by injury.  The muscles or cartilage can be strained by coughing or overwork.  The cartilage can be affected by inflammation and become sore (costochondritis). DIAGNOSIS  Lab tests or other studies may be needed to find the cause of your pain. Your health care provider may have you take a test called an ambulatory electrocardiogram (ECG). An ECG records your heartbeat patterns over a 24-hour period. You may also have other tests, such as:  Transthoracic echocardiogram (TTE). During echocardiography, sound waves are used to evaluate how blood flows through your  heart.  Transesophageal echocardiogram (TEE).  Cardiac monitoring. This allows your health care provider to monitor your heart rate and rhythm in real time.  Holter monitor. This is a portable device that records your heartbeat and can help diagnose heart arrhythmias. It allows your health care provider to track your heart activity for several days, if needed.  Stress tests by exercise or by giving medicine that makes the heart beat faster. TREATMENT   Treatment depends on what may be causing your chest pain. Treatment may include:  Acid blockers for heartburn.  Anti-inflammatory medicine.  Pain medicine for inflammatory conditions.  Antibiotics if an infection is present.  You may be advised to change lifestyle habits. This includes stopping smoking and avoiding alcohol, caffeine, and chocolate.  You may be advised to keep your head raised (elevated) when sleeping. This reduces the chance of acid going backward from your stomach into your esophagus. Most of the time, nonspecific chest pain will improve within 2-3 days with rest and mild pain medicine.  HOME CARE INSTRUCTIONS   If antibiotics were prescribed, take them as directed. Finish them even if you start to feel better.  For the next few days, avoid physical activities that bring on chest pain. Continue physical activities as directed.  Do not use any tobacco products, including cigarettes, chewing tobacco, or electronic cigarettes.  Avoid drinking alcohol.  Only take medicine as directed by your health care provider.  Follow your health care provider's suggestions for further testing if your chest pain does not go away.  Keep any follow-up appointments  you made. If you do not go to an appointment, you could develop lasting (chronic) problems with pain. If there is any problem keeping an appointment, call to reschedule. SEEK MEDICAL CARE IF:   Your chest pain does not go away, even after treatment.  You have a rash  with blisters on your chest.  You have a fever. SEEK IMMEDIATE MEDICAL CARE IF:   You have increased chest pain or pain that spreads to your arm, neck, jaw, back, or abdomen.  You have shortness of breath.  You have an increasing cough, or you cough up blood.  You have severe back or abdominal pain.  You feel nauseous or vomit.  You have severe weakness.  You faint.  You have chills. This is an emergency. Do not wait to see if the pain will go away. Get medical help at once. Call your local emergency services (911 in [place] Do not drive yourself to the hospital. MAKE SURE YOU:   Understand these instructions.  Will watch your condition.  Will get help right away if you are not doing well or get worse. Document Released: 11/27/2004 Document Revised: 02/22/2013 Document Reviewed: 09/23/2007 [facility] Patient Information 2015 [place], [place]. This information is not intended to replace advice given to you by your health care provider. Make sure you discuss any questions you have with your health care provider.

## 2014-04-17 NOTE — ED Notes (Signed)
Pt reports intermittent right sided chest pain and SOB for the last 3 days. Denies pain at present. Worse with inspiration.

## 2014-04-17 NOTE — ED Provider Notes (Signed)
CSN: [id number]     Arrival date & time 04/17/14  1010 History   First MD Initiated Contact with Patient 04/17/14 1023     Chief Complaint  Patient presents with  . Chest Pain     (Consider location/radiation/quality/duration/timing/severity/associated sxs/prior Treatment) HPI Comments: 74 her old female with a history of hypertension, hypercholesterolemia and chest pain, s/p neg myoview in 2014 comes in with cc of chest pain. Pt reports having chest pain x 1 day. Chest pain is right sided, and feels sharp. Pain lasts for few seconds, and is unprovoked. Pain has no aggravating or relieving factor. There is no associated nausea, dib. No fevers, chills, new cough. Pt has had some dull pain in her shoulder, right side, for few days. That pain is also non exertional. Pt saw her pcp, and is being treated as arthritis. No trauma.   ROS 10 Systems reviewed and are negative for acute change except as noted in the HPI.     Patient is a 81 y.o. female presenting with chest pain. The history is provided by the patient.  Chest Pain   Past Medical History  Diagnosis Date  . Chest pain     Hx  . Hypothyroidism   . Hyperlipidemia     no meds  . Knee pain   . Peripheral neuropathy   . Fibromyalgia   . PAF (paroxysmal atrial fibrillation)     a. 06/2012 Flecainide and Xarelto started;  b. 06/2012 DCCV;  c. 06/2012 Syncope in setting of bradycardia->flecainide and dilt d/c'd->Multaq started.  . Syncope     a. 06/2012 - in setting of bradycardia following dccv->flecainide and dilt d/c'd.  . SVD (spontaneous vaginal delivery)   . COPD (chronic obstructive pulmonary disease)     uses inhaler  . Arthritis     knees/hands  . Hypertension     no meds currently - Dr Melburn Popper   Past Surgical History  Procedure Laterality Date  . US echocardiography  09/14/2007    EF 55-60%  . Cardiovascular stress test  08/29/2009    EF 86%  . Abdominal hysterectomy    . Joint replacement      Lt. TKA in 2010  .  Cardioversion N/A 01/11/2013    Procedure: CARDIOVERSION;  Surgeon: Lewayne Bunting, MD;  Location: [facility] ENDOSCOPY;  Service: Cardiovascular;  Laterality: N/A;  . Cardioversion N/A 06/22/2013    Procedure: CARDIOVERSION;  Surgeon: Vesta Mixer, MD;  Location: [facility] ENDOSCOPY;  Service: Cardiovascular;  Laterality: N/A;  . Colonoscopy    . Anterior and posterior repair N/A 12/21/2013    Procedure: ANTERIOR (CYSTOCELE) AND POSTERIOR REPAIR (RECTOCELE);  Surgeon: Lavina Hamman, MD;  Location: [facility] ORS;  Service: Gynecology;  Laterality: N/A;   Family History  Problem Relation Age of Onset  . Heart failure Mother    History  Substance Use Topics  . Smoking status: Never Smoker   . Smokeless tobacco: Never Used  . Alcohol Use: No   OB History    No data available     Review of Systems  Cardiovascular: Positive for chest pain.      Allergies  Metoprolol; Crestor; Lipitor; Livalo; Other; Zetia; Lisinopril; Penicillins; Statins; and Eliquis  Home Medications   Prior to Admission medications   Medication Sig Start Date End Date Taking? Authorizing Provider  acetaminophen (TYLENOL) 500 MG tablet Take 1,000 mg by mouth every 6 (six) hours as needed for pain.   Yes Historical Provider, MD  albuterol (PROAIR HFA) 108 (90  BASE) MCG/ACT inhaler Inhale 2 puffs into the lungs every 6 (six) hours as needed for shortness of breath.    Yes Historical Provider, MD  calcium carbonate (OS-CAL) 600 MG TABS tablet Take 600 mg by mouth daily with breakfast.   Yes Historical Provider, MD  Cholecalciferol (VITAMIN D) 2000 UNITS tablet Take 1,000 Units by mouth daily.    Yes Historical Provider, MD  clobetasol (TEMOVATE) 0.05 % external solution Apply 1 application topically 2 (two) times daily.  04/25/13 04/25/14 Yes Historical Provider, MD  docusate sodium (COLACE) 100 MG capsule Take 200 mg by mouth daily.    Yes Historical Provider, MD  dronedarone (MULTAQ) 400 MG tablet Take 1 tablet (400 mg total) by  mouth 2 (two) times daily with a meal. 01/09/14  Yes Vesta MixerPhilip J Nahser, MD  fish oil-omega-3 fatty acids 1000 MG capsule Take 1 g by mouth daily.   Yes Historical Provider, MD  IBUPROFEN PO Take 1 tablet by mouth every 6 (six) hours as needed (pain).   Yes Historical Provider, MD  KLOR-CON M20 20 MEQ tablet TAKE 1 TABLET (20 MEQ TOTAL) BY MOUTH DAILY. 01/10/14  Yes Vesta MixerPhilip J Nahser, MD  levothyroxine (SYNTHROID, LEVOTHROID) 125 MCG tablet Take 125 mcg by mouth daily.     Yes Historical Provider, MD  methylcellulose (ARTIFICIAL TEARS) 1 % ophthalmic solution Place 1 drop into both eyes as needed (for dry eyes).   Yes Historical Provider, MD  Multiple Vitamins-Minerals (ICAPS) CAPS Take 1 capsule by mouth daily.   Yes Historical Provider, MD  nortriptyline (PAMELOR) 75 MG capsule Take 75 mg by mouth at bedtime.     Yes Historical Provider, MD  Rivaroxaban (XARELTO) 20 MG TABS tablet Take 20 mg by mouth daily with supper.  05/11/13  Yes Historical Provider, MD  tiotropium (SPIRIVA) 18 MCG inhalation capsule Place 18 mcg into inhaler and inhale daily.   Yes Historical Provider, MD  Glucosamine-Chondroit-Vit C-Mn (GLUCOSAMINE CHONDR 1500 COMPLX) CAPS Take 1 capsule by mouth daily.    Historical Provider, MD  oxyCODONE-acetaminophen (PERCOCET/ROXICET) 5-325 MG per tablet Take 1-2 tablets by mouth every 4 (four) hours as needed for severe pain (moderate to severe pain (when tolerating fluids)). Patient not taking: Reported on 04/17/2014 12/22/13   Lavina Hammanodd Meisinger, MD  psyllium (METAMUCIL) 58.6 % packet Take 1 packet by mouth daily as needed (constipation).     Historical Provider, MD   BP 190/70 mmHg  Pulse 65  Temp(Src) 98.1 F (36.7 C) (Oral)  Resp 19  Wt 157 lb (71.215 kg)  SpO2 100% Physical Exam  Constitutional: She is oriented to person, place, and time. She appears well-developed and well-nourished.  HENT:  Head: Normocephalic and atraumatic.  Eyes: EOM are normal. Pupils are equal, round, and  reactive to light.  Neck: Neck supple.  Cardiovascular: Normal rate and regular rhythm.   Murmur heard. Pulmonary/Chest: Effort normal. No respiratory distress.  Abdominal: Soft. She exhibits no distension. There is no tenderness. There is no rebound and no guarding.  Musculoskeletal: She exhibits no edema or tenderness.  Neurological: She is alert and oriented to person, place, and time.  Skin: Skin is warm and dry.  Nursing note and vitals reviewed.   ED Course  Procedures (including critical care time) Labs Review Labs Reviewed  BASIC METABOLIC PANEL - Abnormal; Notable for the following:    GFR calc non Af Amer 48 (*)    GFR calc Af Amer 55 (*)    Anion gap 4 (*)  All other components within normal limits  CBC  I-STAT TROPOININ, ED  Rosezena Sensor, ED    Imaging Review Dg Chest Port 1 View  04/17/2014   CLINICAL DATA:  Three-day history of chest pain.  EXAM: PORTABLE CHEST - 1 VIEW  COMPARISON:  December 19, 2012  FINDINGS: There is slight scarring in the left base. There is no edema or consolidation. Heart size and pulmonary vascularity are normal. No adenopathy. No bone lesions.  IMPRESSION: No edema or consolidation.  Slight scarring left base.   Electronically Signed   By: Bretta Bang III M.D.   On: 04/17/2014 10:44     EKG Interpretation   Date/Time:  Monday April 17 2014 10:19:44 EST Ventricular Rate:  64 PR Interval:  208 QRS Duration: 98 QT Interval:  405 QTC Calculation: 418 R Axis:   27 Text Interpretation:  Sinus rhythm Minimal ST depression, anterolateral  leads No significant change since Confirmed by Aquil Duhe, MD, Madason Rauls ([id number])  on 04/17/2014 10:52:22 AM      MDM   Final diagnoses:  Atypical chest pain    Pt comes in with chest pain.  Differential diagnosis includes: ACS syndrome CHF exacerbation Valvular disorder Myocarditis Pericarditis Pericardial effusion Pneumonia Pleural effusion Pulmonary  edema PE Anemia Musculoskeletal pain  Pain is atypical, right sided and midsternal. Non reproducible. Sharp pain, lasting few seconds, and recurrent. No pleuritic or exertional component. Pt has HEAR score of 3- age and risk factors. Trops x 2 ordered, and pt will see Dr. Melburn Popper. I spoke with Florentina Addison, with Cards, she will get patient set up with Dr. Elease Hashimoto this week.  Otherwise -there is no clinical concerns for PE, dissection, PNA. Lungs are clear, no concerns for COPD. Unsure if this GI, or this is musculoskeletal right now.  Return precautions discussed. Pt and family comfortable with the plan.    Derwood Kaplan, MD 04/17/14 1520

## 2014-04-28 ENCOUNTER — Ambulatory Visit (INDEPENDENT_AMBULATORY_CARE_PROVIDER_SITE_OTHER): Payer: Medicare Other | Admitting: Cardiovascular Disease

## 2014-04-28 ENCOUNTER — Encounter: Payer: Self-pay | Admitting: Cardiovascular Disease

## 2014-04-28 VITALS — BP 140/80 | HR 67 | Ht 64.0 in | Wt 160.0 lb

## 2014-04-28 DIAGNOSIS — E785 Hyperlipidemia, unspecified: Secondary | ICD-10-CM

## 2014-04-28 NOTE — Patient Instructions (Addendum)

## 2014-04-28 NOTE — Progress Notes (Signed)
Cardiology Office Note   Date:  04/28/2014   ID:  Angela Blackburn S Wahlberg, DOB 02/14/1933, MRN [id number]  PCP:  Angela Blackburn,MICHAEL C, MD  Cardiologist:   Vesta MixerNahser, Chelse Matas J, MD   Chief Complaint  Patient presents with  . Atrial Fibrillation   1. Hypertension 2. History chest pains-negative stress Myoview study in November, 2009 3. Hypothyroidism 4. Hypercholesterolemia 5. Peripheral neuropathy 6. Atrial fibrillation-currently on his Xarelto to prevent thromboembolus. 7. Hx of syncope - May 2015.    History of Present Illness:  Angela Blackburn is a [age] her old female with a history of hypertension, hypercholesterolemia and a chest pain. She's had a negative stress test in November 2009. She also has a history of hypothyroidism.  She was diagnosed as having a peripheral neuropathy recently. She's been placed on some medication which seems to be helping some.  She's not eating quite as much exercise as she would like. She's had a little bit of shortness breath with exertion.   Oct. 8, 2013 -  She checks her blood pressure at home periodically. Her readings are on the low side. She has been cutting back on her salt. She's been having some episodes of jaw pain with radiation across her chest and into her shoulders. This typically occurs at night when she's resting. He typically does not occur with exertion. It lasts for about 5 minutes.   June 02, 2012:  She has complained of some chest discomfort when I last saw her in October. A stress Myoview study was normal. She has normal left ventricular function and no ischemia.  She has continued to have some jaw / gum pain which then goes to her chest . It typically occurs at night when she is in bed. Never occurs with exercise. She tries to exercise regularly ( water aerobics and walking)   Nov. 17, 2014:  She has had some chest pains earlier this year. She still has some jaw pain. Myoview study was normal.  She keeps her BP readings - all  of her home readings are normal.   She had a cardioversion last week - was successful. Unfortunately, she has already gone back into A-Fib. She cannot tell whether her rhythm is normal or in AFib.  She does have some leg swelling  May 18, 2013:  Angela Blackburn has not been doing as well. She has some shoulder blade pain - occurs with exertion. Better with rest or tylenol. Not associated with dyspnea or diaphoresis.   She was on Eliquis but she developed a rash. She is now on Xarelto which she tolerates well.   June 03, 2013:  We'll try her on metoprolol at her last visit but she became short of breath. We substituted diltiazem for the metoprolol. She does not think she is any better.   Sept. 8, 2015:  Angela Blackburn is doing well. She brought her blood pressure log from home. All of her blood pressure readings are in the normal range.. She had several episodes of syncope back in May, 2015. She has been restricted from driving until November, [id number]. She's not had any recent episodes of syncope.   Feb. 26, 2016:  Angela Blackburn is a 81 y.o. female who presents for further evaluation of an episode of CP on the right side of her  chest  She went to the ER on Feb. 15.   Work up was negative.  The pain has largely resolved.  The pain was not associated with eating or drinking, nor exercise.  Not associated with twisting or turning her torso.  Had not done any strenuous exercise.   3 weeks ago, she developed some back pain  She seems to be doing better at this point.      Past Medical History  Diagnosis Date  . Chest pain     Hx  . Hypothyroidism   . Hyperlipidemia     no meds  . Knee pain   . Peripheral neuropathy   . Fibromyalgia   . PAF (paroxysmal atrial fibrillation)     a. 06/2012 Flecainide and Xarelto started;  b. 06/2012 DCCV;  Blackburn. 06/2012 Syncope in setting of bradycardia->flecainide and dilt d/Blackburn'd->Multaq started.  . Syncope     a. 06/2012 - in setting of bradycardia  following dccv->flecainide and dilt d/Blackburn'd.  . SVD (spontaneous vaginal delivery)   . COPD (chronic obstructive pulmonary disease)     uses inhaler  . Arthritis     knees/hands  . Hypertension     no meds currently - Dr Melburn Popper    Past Surgical History  Procedure Laterality Date  . US echocardiography  09/14/2007    EF 55-60%  . Cardiovascular stress test  08/29/2009    EF 86%  . Abdominal hysterectomy    . Joint replacement      Lt. TKA in 2010  . Cardioversion N/A 01/11/2013    Procedure: CARDIOVERSION;  Surgeon: Lewayne Bunting, MD;  Location: [facility] ENDOSCOPY;  Service: Cardiovascular;  Laterality: N/A;  . Cardioversion N/A 06/22/2013    Procedure: CARDIOVERSION;  Surgeon: Vesta Mixer, MD;  Location: [facility] ENDOSCOPY;  Service: Cardiovascular;  Laterality: N/A;  . Colonoscopy    . Anterior and posterior repair N/A 12/21/2013    Procedure: ANTERIOR (CYSTOCELE) AND POSTERIOR REPAIR (RECTOCELE);  Surgeon: Lavina Hamman, MD;  Location: [facility] ORS;  Service: Gynecology;  Laterality: N/A;     Current Outpatient Prescriptions  Medication Sig Dispense Refill  . acetaminophen (TYLENOL) 500 MG tablet Take 1,000 mg by mouth every 6 (six) hours as needed for pain.    [name] albuterol (PROAIR HFA) 108 (90 BASE) MCG/ACT inhaler Inhale 2 puffs into the lungs every 6 (six) hours as needed for shortness of breath.     . calcium carbonate (OS-CAL) 600 MG TABS tablet Take 600 mg by mouth daily with breakfast.    . Cholecalciferol (VITAMIN D) 2000 UNITS tablet Take 1,000 Units by mouth daily.     [name] docusate sodium (COLACE) 100 MG capsule Take 200 mg by mouth daily.     [name] dronedarone (MULTAQ) 400 MG tablet Take 1 tablet (400 mg total) by mouth 2 (two) times daily with a meal. 60 tablet 6  . fish oil-omega-3 fatty acids 1000 MG capsule Take 1 g by mouth daily.    . Glucosamine-Chondroit-Vit Blackburn-Mn (GLUCOSAMINE CHONDR 1500 COMPLX) CAPS Take 1 capsule by mouth daily.    . IBUPROFEN PO Take 1 tablet by mouth every 6  (six) hours as needed (pain).    [name] KLOR-CON M20 20 MEQ tablet TAKE 1 TABLET (20 MEQ TOTAL) BY MOUTH DAILY. 30 tablet 11  . levothyroxine (SYNTHROID, LEVOTHROID) 125 MCG tablet Take 125 mcg by mouth daily.      . methylcellulose (ARTIFICIAL TEARS) 1 % ophthalmic solution Place 1 drop into both eyes as needed (for dry eyes).    . Multiple Vitamins-Minerals (ICAPS) CAPS Take 1 capsule by mouth daily.    . nortriptyline (PAMELOR) 75 MG capsule Take 75 mg by mouth at bedtime.      [name]  oxyCODONE-acetaminophen (PERCOCET/ROXICET) 5-325 MG per tablet Take 1-2 tablets by mouth every 4 (four) hours as needed for severe pain (moderate to severe pain (when tolerating fluids)). 30 tablet 0  . psyllium (METAMUCIL) 58.6 % packet Take 1 packet by mouth daily as needed (constipation).     . Rivaroxaban (XARELTO) 20 MG TABS tablet Take 20 mg by mouth daily with supper.     . tiotropium (SPIRIVA) 18 MCG inhalation capsule Place 18 mcg into inhaler and inhale daily.     No current facility-administered medications for this visit.    Allergies:   Metoprolol; Crestor; Lipitor; Livalo; Other; Zetia; Lisinopril; Penicillins; Statins; and Eliquis    Social History:  The patient  reports that she has never smoked. She has never used smokeless tobacco. She reports that she does not drink alcohol or use illicit drugs.   Family History:  The patient's family history includes Heart failure in her mother.    ROS:  Please see the history of present illness.    Review of Systems: Constitutional:  denies fever, chills, diaphoresis, appetite change and fatigue.  HEENT: denies photophobia, eye pain, redness, hearing loss, ear pain, congestion, sore throat, rhinorrhea, sneezing, neck pain, neck stiffness and tinnitus.  Respiratory: denies SOB, DOE, cough, chest tightness, and wheezing.  Cardiovascular: denies chest pain, palpitations and leg swelling.  Gastrointestinal: denies nausea, vomiting, abdominal pain, diarrhea,  constipation, blood in stool.  Genitourinary: denies dysuria, urgency, frequency, hematuria, flank pain and difficulty urinating.  Musculoskeletal: denies  myalgias, back pain, joint swelling, arthralgias and gait problem.   Skin: denies pallor, rash and wound.  Neurological: denies dizziness, seizures, syncope, weakness, light-headedness, numbness and headaches.   Hematological: denies adenopathy, easy bruising, personal or family bleeding history.  Psychiatric/ Behavioral: denies suicidal ideation, mood changes, confusion, nervousness, sleep disturbance and agitation.       All other systems are reviewed and negative.    PHYSICAL EXAM: VS:  BP 140/80 mmHg  Pulse 67  Ht  (1.626 m)  Wt 160 lb (72.576 kg)  BMI 27.45 kg/m2  SpO2 98% , BMI Body mass index is 27.45 kg/(m^2). GEN: Well nourished, well developed, in no acute distress HEENT: normal Neck: no JVD, carotid bruits, or masses Cardiac: RRR; no murmurs, rubs, or gallops,no edema ,  Occasional premature beats Respiratory:  clear to auscultation bilaterally, normal work of breathing GI: soft, nontender, nondistended, + BS MS: no deformity or atrophy Skin: warm and dry, no rash Neuro:  Strength and sensation are intact Psych: normal   EKG:  EKG is not ordered today.    Recent Labs: 12/15/2013: ALT 15 04/17/2014: BUN 22; Creatinine 1.08; Hemoglobin 12.5; Platelets 215; Potassium 4.0; Sodium 138    Lipid Panel    Component Value Date/Time   CHOL 184 11/08/2013 0845   TRIG 124.0 11/08/2013 0845   HDL 58.10 11/08/2013 0845   CHOLHDL 3 11/08/2013 0845   VLDL 24.8 11/08/2013 0845   LDLCALC 101* 11/08/2013 0845   LDLDIRECT 121.6 10/01/2011 0926      Wt Readings from Last 3 Encounters:  04/28/14 160 lb (72.576 kg)  04/17/14 157 lb (71.215 kg)  11/08/13 160 lb 12.8 oz (72.938 kg)      Other studies Reviewed: Additional studies/ records that were reviewed today include: ER visit.. Review of the above records  demonstrates: negative work up .    ASSESSMENT AND PLAN:  1. Hypertension - blood pressures quite variable. I suspect that she is eating salt on intermittent basis. She  brought her blood pressure log with her. Many of her blood pressure readings are fairly low. I've encouraged her to stick to a better diet. I do not think that she needs additional medications.  2. History chest pains-negative stress Myoview study in November, 2009, 2011, 2013.  Her chest pain sounds noncardiac. I do not think that she needs another Myoview study at this time.  3. Hypothyroidism  4. Hypercholesterolemia -   5. Peripheral neuropathy 6. Atrial fibrillation-currently on his Xarelto.  She thinks that she might have a slight rash on her arms but I was not able to see any significant rash. Continue same medications.  7. Hx of syncope - May 2015.   Current medicines are reviewed at length with the patient today.  The patient does not have concerns regarding medicines.  The following changes have been made:  no change   Disposition:   FU with me in 6 months .     Signed, Errin Whitelaw, [name], MD  04/28/2014 9:29 AM    [facility] [address] [place], [place], [place]  [address] Phone: [phone]; Fax: [phone]

## 2014-05-01 ENCOUNTER — Telehealth: Payer: Self-pay | Admitting: Cardiovascular Disease

## 2014-05-01 NOTE — Telephone Encounter (Signed)
Cipro will be ok

## 2014-05-01 NOTE — Telephone Encounter (Signed)
Patient starting Cipro for a 10 day course. Patient just wanted Dr. Harvie BridgeNahser's okay to take Cipro alongside her cardiac medications. Encouraged her on the scheduling of Cipro and compliance. Provided education on side effects of ASA and Cipro. Routed to Dr. Elease HashimotoNahser for any additional advisement.

## 2014-05-01 NOTE — Telephone Encounter (Signed)
Notified patient of Dr. Harvie BridgeNahser's reply. Patient verbalized appreciation for the return phone call and information.

## 2014-05-01 NOTE — Telephone Encounter (Signed)
New Msg    Pt c/o medication issue:  1. Name of Medication: cipro  2. How are you currently taking this medication (dosage and times per day)? 500 mg, twice daily  3. Are you having a reaction (difficulty breathing--STAT)? No   4. What is your medication issue? Pt states she was given Cipro for a bladder infection and wants to make sure it is ok to take it.    Please return pt call.

## 2014-05-12 ENCOUNTER — Ambulatory Visit: Payer: Medicare Other | Admitting: Cardiovascular Disease

## 2014-08-09 ENCOUNTER — Other Ambulatory Visit: Payer: Self-pay | Admitting: Cardiovascular Disease

## 2014-08-31 ENCOUNTER — Other Ambulatory Visit: Payer: Self-pay | Admitting: Family Medicine

## 2014-08-31 DIAGNOSIS — R5381 Other malaise: Secondary | ICD-10-CM

## 2014-10-19 ENCOUNTER — Other Ambulatory Visit: Payer: Self-pay

## 2014-10-19 ENCOUNTER — Other Ambulatory Visit: Payer: Self-pay | Admitting: Family Medicine

## 2014-10-19 DIAGNOSIS — Z780 Asymptomatic menopausal state: Secondary | ICD-10-CM

## 2014-10-19 DIAGNOSIS — Z1231 Encounter for screening mammogram for malignant neoplasm of breast: Secondary | ICD-10-CM

## 2014-10-19 DIAGNOSIS — E2839 Other primary ovarian failure: Secondary | ICD-10-CM

## 2014-10-30 ENCOUNTER — Encounter: Payer: Self-pay | Admitting: Cardiovascular Disease

## 2014-10-30 ENCOUNTER — Ambulatory Visit (INDEPENDENT_AMBULATORY_CARE_PROVIDER_SITE_OTHER): Payer: Medicare Other | Admitting: Cardiovascular Disease

## 2014-10-30 ENCOUNTER — Other Ambulatory Visit (INDEPENDENT_AMBULATORY_CARE_PROVIDER_SITE_OTHER): Payer: Medicare Other | Admitting: *Deleted

## 2014-10-30 VITALS — BP 160/80 | HR 64 | Ht 65.0 in | Wt 157.8 lb

## 2014-10-30 DIAGNOSIS — R42 Dizziness and giddiness: Secondary | ICD-10-CM

## 2014-10-30 DIAGNOSIS — R0600 Dyspnea, unspecified: Secondary | ICD-10-CM | POA: Diagnosis not present

## 2014-10-30 DIAGNOSIS — E785 Hyperlipidemia, unspecified: Secondary | ICD-10-CM | POA: Diagnosis not present

## 2014-10-30 DIAGNOSIS — E7800 Pure hypercholesterolemia, unspecified: Secondary | ICD-10-CM

## 2014-10-30 DIAGNOSIS — I481 Persistent atrial fibrillation: Secondary | ICD-10-CM

## 2014-10-30 DIAGNOSIS — I4819 Other persistent atrial fibrillation: Secondary | ICD-10-CM

## 2014-10-30 LAB — HEPATIC FUNCTION PANEL
ALT: 13 U/L (ref 0–35)
AST: 24 U/L (ref 0–37)
Albumin: 4 g/dL (ref 3.5–5.2)
Alkaline Phosphatase: 64 U/L (ref 39–117)
BILIRUBIN TOTAL: 0.5 mg/dL (ref 0.2–1.2)
Bilirubin, Direct: 0.1 mg/dL (ref 0.0–0.3)
TOTAL PROTEIN: 7.5 g/dL (ref 6.0–8.3)

## 2014-10-30 LAB — BASIC METABOLIC PANEL
BUN: 26 mg/dL — AB (ref 6–23)
CHLORIDE: 102 meq/L (ref 96–112)
CO2: 27 meq/L (ref 19–32)
Calcium: 9.2 mg/dL (ref 8.4–10.5)
Creatinine, Ser: 1.2 mg/dL (ref 0.40–1.20)
GFR: 45.91 mL/min — ABNORMAL LOW (ref >60.00)
GLUCOSE: 87 mg/dL (ref 70–99)
POTASSIUM: 4.3 meq/L (ref 3.5–5.1)
Sodium: 137 mEq/L (ref 135–145)

## 2014-10-30 LAB — LIPID PANEL
CHOL/HDL RATIO: 3
CHOLESTEROL: 170 mg/dL (ref 0–200)
HDL: 56.8 mg/dL (ref >39.00)
LDL Cholesterol: 96 mg/dL (ref 0–99)
NonHDL: 113.43
Triglycerides: 87 mg/dL (ref 0.0–149.0)
VLDL: 17.4 mg/dL (ref 0.0–40.0)

## 2014-10-30 NOTE — Progress Notes (Signed)
Cardiology Office Note   Date:  10/30/2014   ID:  Angela Blackburn, DOB April 08, 1932, MRN [id number]  PCP:  Angela Inch, MD  Cardiologist:   Vesta Mixer, MD   Chief Complaint  Patient presents with  . Atrial Fibrillation  . Hypertension   1. Hypertension 2. History chest pains-negative stress Myoview study in November, 2009 3. Hypothyroidism 4. Hypercholesterolemia 5. Peripheral neuropathy 6. Atrial fibrillation-currently on his Xarelto to prevent thromboembolus. 7. Hx of syncope - May 2015.    History of Present Illness:  Angela Blackburn is a 39 her old female with a history of hypertension, hypercholesterolemia and a chest pain. She's had a negative stress test in November 2009. She also has a history of hypothyroidism.  She was diagnosed as having a peripheral neuropathy recently. She's been placed on some medication which seems to be helping some.  She's not eating quite as much exercise as she would like. She's had a little bit of shortness breath with exertion.   Oct. 8, 2013 -  She checks her blood pressure at home periodically. Her readings are on the low side. She has been cutting back on her salt. She's been having some episodes of jaw pain with radiation across her chest and into her shoulders. This typically occurs at night when she's resting. He typically does not occur with exertion. It lasts for about 5 minutes.   June 02, 2012:  She has complained of some chest discomfort when I last saw her in October. A stress Myoview study was normal. She has normal left ventricular function and no ischemia.  She has continued to have some jaw / gum pain which then goes to her chest . It typically occurs at night when she is in bed. Never occurs with exercise. She tries to exercise regularly ( water aerobics and walking)   Nov. 17, 2014:  She has had some chest pains earlier this year. She still has some jaw pain. Myoview study was normal.  She keeps her  BP readings - all of her home readings are normal.   She had a cardioversion last week - was successful. Unfortunately, she has already gone back into A-Fib. She cannot tell whether her rhythm is normal or in AFib.  She does have some leg swelling  May 18, 2013:  [name] has not been doing as well. She has some shoulder blade pain - occurs with exertion. Better with rest or tylenol. Not associated with dyspnea or diaphoresis.   She was on Eliquis but she developed a rash. She is now on Xarelto which she tolerates well.   June 03, 2013:  We'll try her on metoprolol at her last visit but she became short of breath. We substituted diltiazem for the metoprolol. She does not think she is any better.   Sept. 8, 2015:  [name] is doing well. She brought her blood pressure log from home. All of her blood pressure readings are in the normal range.. She had several episodes of syncope back in May, 2015. She has been restricted from driving until November, [id number]. She's not had any recent episodes of syncope.   Feb. 26, 2016:  Angela Blackburn is a 81 y.o. female who presents for further evaluation of an episode of CP on the right side of her  chest  She went to the ER on Feb. 15.   Work up was negative.  The pain has largely resolved.  The pain was not associated with eating or drinking,  nor exercise.  Not associated with twisting or turning her torso.  Had not done any strenuous exercise.   3 weeks ago, she developed some back pain  She seems to be doing better at this point.   Aug. 29, 2016: Doing ok. Has nausea and shortness of breath that is typically started by a sneezing episodes     Past Medical History  Diagnosis Date  . Chest pain     Hx  . Hypothyroidism   . Hyperlipidemia     no meds  . Knee pain   . Peripheral neuropathy   . Fibromyalgia   . PAF (paroxysmal atrial fibrillation)     a. 06/2012 Flecainide and Xarelto started;  b. 06/2012 DCCV;  c. 06/2012  Syncope in setting of bradycardia->flecainide and dilt d/c'd->Multaq started.  . Syncope     a. 06/2012 - in setting of bradycardia following dccv->flecainide and dilt d/c'd.  . SVD (spontaneous vaginal delivery)   . COPD (chronic obstructive pulmonary disease)     uses inhaler  . Arthritis     knees/hands  . Hypertension     no meds currently - Dr Melburn Popper    Past Surgical History  Procedure Laterality Date  . US echocardiography  09/14/2007    EF 55-60%  . Cardiovascular stress test  08/29/2009    EF 86%  . Abdominal hysterectomy    . Joint replacement      Lt. TKA in 2010  . Cardioversion N/A 01/11/2013    Procedure: CARDIOVERSION;  Surgeon: Lewayne Bunting, MD;  Location: [facility] ENDOSCOPY;  Service: Cardiovascular;  Laterality: N/A;  . Cardioversion N/A 06/22/2013    Procedure: CARDIOVERSION;  Surgeon: Vesta Mixer, MD;  Location: [facility] ENDOSCOPY;  Service: Cardiovascular;  Laterality: N/A;  . Colonoscopy    . Anterior and posterior repair N/A 12/21/2013    Procedure: ANTERIOR (CYSTOCELE) AND POSTERIOR REPAIR (RECTOCELE);  Surgeon: Lavina Hamman, MD;  Location: [facility] ORS;  Service: Gynecology;  Laterality: N/A;     Current Outpatient Prescriptions  Medication Sig Dispense Refill  . acetaminophen (TYLENOL) 500 MG tablet Take 1,000 mg by mouth every 6 (six) hours as needed for pain.    [name] albuterol (PROAIR HFA) 108 (90 BASE) MCG/ACT inhaler Inhale 2 puffs into the lungs every 6 (six) hours as needed for shortness of breath.     . calcium carbonate (OS-CAL) 600 MG TABS tablet Take 600 mg by mouth daily with breakfast.    . Cholecalciferol (VITAMIN D) 2000 UNITS tablet Take 1,000 Units by mouth daily.     [name] docusate sodium (COLACE) 100 MG capsule Take 200 mg by mouth daily.     . fish oil-omega-3 fatty acids 1000 MG capsule Take 1 g by mouth daily.    . Glucosamine-Chondroit-Vit C-Mn (GLUCOSAMINE CHONDR 1500 COMPLX) CAPS Take 1 capsule by mouth daily.    . IBUPROFEN PO Take 1 tablet by  mouth every 6 (six) hours as needed (pain).    [name] KLOR-CON M20 20 MEQ tablet TAKE 1 TABLET (20 MEQ TOTAL) BY MOUTH DAILY. 30 tablet 11  . levothyroxine (SYNTHROID, LEVOTHROID) 125 MCG tablet Take 125 mcg by mouth daily.      . methylcellulose (ARTIFICIAL TEARS) 1 % ophthalmic solution Place 1 drop into both eyes as needed (for dry eyes).    . MULTAQ 400 MG tablet TAKE 1 TABLET (400 MG TOTAL) BY MOUTH 2 (TWO) TIMES DAILY WITH A MEAL. 60 tablet 5  . Multiple Vitamins-Minerals (ICAPS) CAPS Take 1  capsule by mouth daily.    . nortriptyline (PAMELOR) 75 MG capsule Take 75 mg by mouth at bedtime.      [name] oxyCODONE-acetaminophen (PERCOCET/ROXICET) 5-325 MG per tablet Take 1-2 tablets by mouth every 4 (four) hours as needed for severe pain (moderate to severe pain (when tolerating fluids)). 30 tablet 0  . psyllium (METAMUCIL) 58.6 % packet Take 1 packet by mouth daily as needed (constipation).     . Rivaroxaban (XARELTO) 20 MG TABS tablet Take 20 mg by mouth daily with supper.     . tiotropium (SPIRIVA) 18 MCG inhalation capsule Place 18 mcg into inhaler and inhale daily.     No current facility-administered medications for this visit.    Allergies:   Metoprolol; Crestor; Lipitor; Livalo; Other; Zetia; Lisinopril; Penicillins; Statins; and Eliquis    Social History:  The patient  reports that she has never smoked. She has never used smokeless tobacco. She reports that she does not drink alcohol or use illicit drugs.   Family History:  The patient's family history includes Heart failure in her mother.    ROS:  Please see the history of present illness.    Review of Systems: Constitutional:  denies fever, chills, diaphoresis, appetite change and fatigue.  HEENT: denies photophobia, eye pain, redness, hearing loss, ear pain, congestion, sore throat, rhinorrhea, sneezing, neck pain, neck stiffness and tinnitus.  Respiratory: denies SOB, DOE, cough, chest tightness, and wheezing.  Cardiovascular:  denies chest pain, palpitations and leg swelling.  Gastrointestinal: denies nausea, vomiting, abdominal pain, diarrhea, constipation, blood in stool.  Genitourinary: denies dysuria, urgency, frequency, hematuria, flank pain and difficulty urinating.  Musculoskeletal: denies  myalgias, back pain, joint swelling, arthralgias and gait problem.   Skin: denies pallor, rash and wound.  Neurological: denies dizziness, seizures, syncope, weakness, light-headedness, numbness and headaches.   Hematological: denies adenopathy, easy bruising, personal or family bleeding history.  Psychiatric/ Behavioral: denies suicidal ideation, mood changes, confusion, nervousness, sleep disturbance and agitation.       All other systems are reviewed and negative.    PHYSICAL EXAM: VS:  There were no vitals taken for this visit. , BMI There is no weight on file to calculate BMI. GEN: Well nourished, well developed, in no acute distress HEENT: normal Neck: no JVD, carotid bruits, or masses Cardiac: RR; no murmurs, rubs, or gallops,no edema ,  Occasional premature beats Respiratory:  clear to auscultation bilaterally, normal work of breathing GI: soft, nontender, nondistended, + BS MS: no deformity or atrophy Skin: warm and dry, no rash Neuro:  Strength and sensation are intact Psych: normal   EKG:  EKG is not ordered today.    Recent Labs: 12/15/2013: ALT 15 04/17/2014: BUN 22; Creatinine, Ser 1.08; Hemoglobin 12.5; Platelets 215; Potassium 4.0; Sodium 138    Lipid Panel    Component Value Date/Time   CHOL 184 11/08/2013 0845   TRIG 124.0 11/08/2013 0845   HDL 58.10 11/08/2013 0845   CHOLHDL 3 11/08/2013 0845   VLDL 24.8 11/08/2013 0845   LDLCALC 101* 11/08/2013 0845   LDLDIRECT 121.6 10/01/2011 0926      Wt Readings from Last 3 Encounters:  04/28/14 72.576 kg (160 lb)  04/17/14 71.215 kg (157 lb)  12/21/13 73.483 kg (162 lb)      Other studies Reviewed: Additional studies/ records that  were reviewed today include: ER visit.. Review of the above records demonstrates: negative work up .   ASSESSMENT AND PLAN:  1. Hypertension - blood pressures quite variable. I  suspect that she is eating salt on intermittent basis. She brought her blood pressure log with her. Many of her blood pressure readings are fairly low. I've encouraged her to stick to a better diet. I do not think that she needs additional medications.  2. History chest pains-negative stress Myoview study in November, 2009, 2011, 2013.  Her chest pain sounds noncardiac. I do not think that she needs another Myoview study at this time.  3. Hypothyroidism  4. Hypercholesterolemia -   Labs checked today   5. Peripheral neuropathy 6. Atrial fibrillation-currently on his Xarelto.    Has had cardioversion last year .  Maintaining  NSR   7. Hx of syncope - May 2015.  8. Dyspnea:    Will repeat the echo card gram. Her last echocardiogram in 2014 reveals normal left ventricular systolic function. She does have mild to moderate pulmonary hypertension ( does have COPD also )     Current medicines are reviewed at length with the patient today.  The patient does not have concerns regarding medicines.  The following changes have been made:  no change   Disposition:   FU with me in 6 months .     Signed, Jaya Lapka, [name], MD  10/30/2014 9:34 AM    [facility] [address] [place], [place], [place]  [address] Phone: [phone]; Fax: [phone]

## 2014-10-30 NOTE — Patient Instructions (Signed)
Medication Instructions:  Your physician recommends that you continue on your current medications as directed. Please refer to the Current Medication list given to you today.  Labwork: NONE  Testing/Procedures: Your physician has requested that you have an echocardiogram. Echocardiography is a painless test that uses sound waves to create images of your heart. It provides your doctor with information about the size and shape of your heart and how well your heart's chambers and valves are working. This procedure takes approximately one hour. There are no restrictions for this procedure.   Follow-Up: Your physician wants you to follow-up in: 6 MONTH OV You will receive a reminder letter in the mail two months in advance. If you don't receive a letter, please call our office to schedule the follow-up appointment.

## 2014-10-30 NOTE — Addendum Note (Signed)
Addended by: Tonita Phoenix on: 10/30/2014 09:18 AM   Modules accepted: Orders

## 2014-11-08 ENCOUNTER — Ambulatory Visit (HOSPITAL_COMMUNITY): Payer: Medicare Other | Attending: Cardiology

## 2014-11-08 ENCOUNTER — Other Ambulatory Visit: Payer: Self-pay

## 2014-11-08 DIAGNOSIS — I4819 Other persistent atrial fibrillation: Secondary | ICD-10-CM

## 2014-11-08 DIAGNOSIS — I481 Persistent atrial fibrillation: Secondary | ICD-10-CM

## 2014-11-08 DIAGNOSIS — E785 Hyperlipidemia, unspecified: Secondary | ICD-10-CM | POA: Insufficient documentation

## 2014-11-08 DIAGNOSIS — I4891 Unspecified atrial fibrillation: Secondary | ICD-10-CM | POA: Insufficient documentation

## 2014-11-08 DIAGNOSIS — I340 Nonrheumatic mitral (valve) insufficiency: Secondary | ICD-10-CM | POA: Insufficient documentation

## 2014-11-08 DIAGNOSIS — R0600 Dyspnea, unspecified: Secondary | ICD-10-CM | POA: Diagnosis not present

## 2014-11-08 DIAGNOSIS — I071 Rheumatic tricuspid insufficiency: Secondary | ICD-10-CM | POA: Diagnosis not present

## 2014-11-08 DIAGNOSIS — I10 Essential (primary) hypertension: Secondary | ICD-10-CM | POA: Diagnosis not present

## 2014-11-20 ENCOUNTER — Ambulatory Visit: Payer: Medicare Other

## 2014-11-20 ENCOUNTER — Other Ambulatory Visit: Payer: Medicare Other

## 2014-11-22 ENCOUNTER — Ambulatory Visit: Payer: Medicare Other

## 2014-12-04 ENCOUNTER — Emergency Department (HOSPITAL_COMMUNITY): Payer: Medicare Other

## 2014-12-04 ENCOUNTER — Emergency Department (HOSPITAL_COMMUNITY)
Admission: EM | Admit: 2014-12-04 | Discharge: 2014-12-04 | Disposition: A | Discharge status: Home / Self Care | Payer: Medicare Other | Attending: Emergency Medicine | Admitting: Emergency Medicine

## 2014-12-04 ENCOUNTER — Encounter (HOSPITAL_COMMUNITY): Payer: Self-pay | Admitting: Emergency Medicine

## 2014-12-04 DIAGNOSIS — R42 Dizziness and giddiness: Secondary | ICD-10-CM | POA: Diagnosis present

## 2014-12-04 DIAGNOSIS — G629 Polyneuropathy, unspecified: Secondary | ICD-10-CM | POA: Diagnosis not present

## 2014-12-04 DIAGNOSIS — E785 Hyperlipidemia, unspecified: Secondary | ICD-10-CM | POA: Diagnosis not present

## 2014-12-04 DIAGNOSIS — I10 Essential (primary) hypertension: Secondary | ICD-10-CM | POA: Diagnosis not present

## 2014-12-04 DIAGNOSIS — Z792 Long term (current) use of antibiotics: Secondary | ICD-10-CM | POA: Insufficient documentation

## 2014-12-04 DIAGNOSIS — R634 Abnormal weight loss: Secondary | ICD-10-CM | POA: Insufficient documentation

## 2014-12-04 DIAGNOSIS — Z79899 Other long term (current) drug therapy: Secondary | ICD-10-CM | POA: Diagnosis not present

## 2014-12-04 DIAGNOSIS — J441 Chronic obstructive pulmonary disease with (acute) exacerbation: Secondary | ICD-10-CM | POA: Insufficient documentation

## 2014-12-04 DIAGNOSIS — M797 Fibromyalgia: Secondary | ICD-10-CM | POA: Diagnosis not present

## 2014-12-04 DIAGNOSIS — I480 Paroxysmal atrial fibrillation: Secondary | ICD-10-CM | POA: Diagnosis not present

## 2014-12-04 DIAGNOSIS — M1990 Unspecified osteoarthritis, unspecified site: Secondary | ICD-10-CM | POA: Diagnosis not present

## 2014-12-04 DIAGNOSIS — E039 Hypothyroidism, unspecified: Secondary | ICD-10-CM | POA: Diagnosis not present

## 2014-12-04 DIAGNOSIS — R0600 Dyspnea, unspecified: Secondary | ICD-10-CM

## 2014-12-04 LAB — COMPREHENSIVE METABOLIC PANEL
ALT: 26 U/L (ref 14–54)
AST: 32 U/L (ref 15–41)
Albumin: 3.8 g/dL (ref 3.5–5.0)
Alkaline Phosphatase: 66 U/L (ref 38–126)
Anion gap: 12 (ref 5–15)
BUN: 16 mg/dL (ref 6–20)
CHLORIDE: 102 mmol/L (ref 101–111)
CO2: 22 mmol/L (ref 22–32)
CREATININE: 1.17 mg/dL — AB (ref 0.44–1.00)
Calcium: 9.3 mg/dL (ref 8.9–10.3)
GFR calc Af Amer: 50 mL/min — ABNORMAL LOW (ref >60)
GFR, EST NON AFRICAN AMERICAN: 43 mL/min — AB (ref >60)
Glucose, Bld: 97 mg/dL (ref 65–99)
POTASSIUM: 4 mmol/L (ref 3.5–5.1)
Sodium: 136 mmol/L (ref 135–145)
Total Bilirubin: 0.6 mg/dL (ref 0.3–1.2)
Total Protein: 7.5 g/dL (ref 6.5–8.1)

## 2014-12-04 LAB — D-DIMER, QUANTITATIVE: D-Dimer, Quant: <0.27 ug/mL-FEU (ref 0.00–0.48)

## 2014-12-04 LAB — CBC
HCT: 41.7 % (ref 36.0–46.0)
Hemoglobin: 13.9 g/dL (ref 12.0–15.0)
MCH: 32 pg (ref 26.0–34.0)
MCHC: 33.3 g/dL (ref 30.0–36.0)
MCV: 96.1 fL (ref 78.0–100.0)
PLATELETS: 222 10*3/uL (ref 150–400)
RBC: 4.34 MIL/uL (ref 3.87–5.11)
RDW: 11.9 % (ref 11.5–15.5)
WBC: 6.2 10*3/uL (ref 4.0–10.5)

## 2014-12-04 LAB — I-STAT TROPONIN, ED: TROPONIN I, POC: 0.01 ng/mL (ref 0.00–0.08)

## 2014-12-04 LAB — PROTIME-INR
INR: 1.67 — ABNORMAL HIGH (ref 0.00–1.49)
PROTHROMBIN TIME: 19.7 s — AB (ref 11.6–15.2)

## 2014-12-04 LAB — BRAIN NATRIURETIC PEPTIDE: B Natriuretic Peptide: 120.7 pg/mL — ABNORMAL HIGH (ref 0.0–100.0)

## 2014-12-04 LAB — APTT: APTT: 34 s (ref 24–37)

## 2014-12-04 MED ORDER — SODIUM CHLORIDE 0.9 % IV SOLN: 1000.0000 mL | INTRAVENOUS | Status: DC

## 2014-12-04 NOTE — ED Notes (Signed)
Ambulated pt. No complaints at this time.

## 2014-12-04 NOTE — Discharge Instructions (Signed)

## 2014-12-04 NOTE — ED Notes (Signed)
Patient has returned from being out of the department; patient placed back on monitor, continuous pulse oximetry and blood pressure cuff; visitor at bedside 

## 2014-12-04 NOTE — ED Notes (Signed)
Patient undressed, in gown, on monitor, continuous pulse oximetry and blood pressure cuff; visitor at bedside 

## 2014-12-04 NOTE — ED Notes (Addendum)
PT has had recent hx of afib and cardioverted x 2. NSR currently. Cardiologist is Dr. Truddie Crumble. Reports feeling dizzy for a few days; no syncopal episodes. Reports SOB upon exertion abnormal per baseline. No edema noted. Reports dry hack occasionally. Denies fevers.

## 2014-12-04 NOTE — ED Notes (Signed)
MD at bedside. 

## 2014-12-04 NOTE — ED Provider Notes (Addendum)
CSN: [id number]     Arrival date & time 12/04/14  4098 History   First MD Initiated Contact with Patient 12/04/14 [phone]     Chief Complaint  Patient presents with  . Dizziness   HPI Patient presents to the emergency room with complaints of lightheadedness over the last couple of days. Some off balance sensation.  No numbness or weakness. No trouble with speech or vision. She is also noticed some shortness of breath when exerting herself.  This started a couple of days ago.  She has noticed that just walking this morning to the kitchen she became short of breath.  Slight dry cough.  No leg swelling.  No fever.   No chest pain. Past Medical History  Diagnosis Date  . Chest pain     Hx  . Hypothyroidism   . Hyperlipidemia     no meds  . Knee pain   . Peripheral neuropathy (HCC)   . Fibromyalgia   . PAF (paroxysmal atrial fibrillation) (HCC)     a. 06/2012 Flecainide and Xarelto started;  b. 06/2012 DCCV;  c. 06/2012 Syncope in setting of bradycardia->flecainide and dilt d/c'd->Multaq started.  . Syncope     a. 06/2012 - in setting of bradycardia following dccv->flecainide and dilt d/c'd.  . SVD (spontaneous vaginal delivery)   . COPD (chronic obstructive pulmonary disease) (HCC)     uses inhaler  . Arthritis     knees/hands  . Hypertension     no meds currently - Dr Melburn Popper   Past Surgical History  Procedure Laterality Date  . US echocardiography  09/14/2007    EF 55-60%  . Cardiovascular stress test  08/29/2009    EF 86%  . Abdominal hysterectomy    . Joint replacement      Lt. TKA in 2010  . Cardioversion N/A 01/11/2013    Procedure: CARDIOVERSION;  Surgeon: Lewayne Bunting, MD;  Location: [facility] ENDOSCOPY;  Service: Cardiovascular;  Laterality: N/A;  . Cardioversion N/A 06/22/2013    Procedure: CARDIOVERSION;  Surgeon: Vesta Mixer, MD;  Location: [facility] ENDOSCOPY;  Service: Cardiovascular;  Laterality: N/A;  . Colonoscopy    . Anterior and posterior repair N/A 12/21/2013     Procedure: ANTERIOR (CYSTOCELE) AND POSTERIOR REPAIR (RECTOCELE);  Surgeon: Lavina Hamman, MD;  Location: [facility] ORS;  Service: Gynecology;  Laterality: N/A;   Family History  Problem Relation Age of Onset  . Heart failure Mother    Social History  Substance Use Topics  . Smoking status: Never Smoker   . Smokeless tobacco: Never Used  . Alcohol Use: No   OB History    No data available     Review of Systems  Constitutional: Positive for unexpected weight change. Fever:  losing some weight, 15 lbs in 6 months.  All other systems reviewed and are negative.     Allergies  Metoprolol; Crestor; Lipitor; Livalo; Other; Zetia; Lisinopril; Penicillins; Statins; and Eliquis  Home Medications   Prior to Admission medications   Medication Sig Start Date End Date Taking? Authorizing Provider  acetaminophen (TYLENOL) 500 MG tablet Take 1,000 mg by mouth every 6 (six) hours as needed for pain.   Yes Historical Provider, MD  albuterol (PROAIR HFA) 108 (90 BASE) MCG/ACT inhaler Inhale 2 puffs into the lungs every 6 (six) hours as needed for shortness of breath.    Yes Historical Provider, MD  calcium carbonate (OS-CAL) 600 MG TABS tablet Take 600 mg by mouth daily with breakfast.   Yes Historical  Provider, MD  cephALEXin (KEFLEX) 250 MG capsule Take 250 mg by mouth daily. 09/14/14  Yes Historical Provider, MD  Cholecalciferol (VITAMIN D) 2000 UNITS tablet Take 1,000 Units by mouth daily.    Yes Historical Provider, MD  Cranberry Extract 250 MG TABS Take 250 mg by mouth 3 (three) times daily.   Yes Historical Provider, MD  docusate sodium (COLACE) 100 MG capsule Take 200 mg by mouth daily.    Yes Historical Provider, MD  fish oil-omega-3 fatty acids 1000 MG capsule Take 1 g by mouth daily.   Yes Historical Provider, MD  KLOR-CON M20 20 MEQ tablet TAKE 1 TABLET (20 MEQ TOTAL) BY MOUTH DAILY. 01/10/14  Yes Vesta Mixer, MD  levothyroxine (SYNTHROID, LEVOTHROID) 125 MCG tablet Take 125 mcg by mouth  daily.     Yes Historical Provider, MD  methylcellulose (ARTIFICIAL TEARS) 1 % ophthalmic solution Place 1 drop into both eyes as needed (for dry eyes).   Yes Historical Provider, MD  MULTAQ 400 MG tablet TAKE 1 TABLET (400 MG TOTAL) BY MOUTH 2 (TWO) TIMES DAILY WITH A MEAL. 08/09/14  Yes Vesta Mixer, MD  Multiple Vitamins-Minerals (ICAPS) CAPS Take 1 capsule by mouth daily.   Yes Historical Provider, MD  nortriptyline (PAMELOR) 75 MG capsule Take 75 mg by mouth at bedtime.     Yes Historical Provider, MD  polyethylene glycol (MIRALAX / GLYCOLAX) packet Take 17 g by mouth daily as needed for mild constipation.   Yes Historical Provider, MD  Probiotic Product (ACIDOPHILUS/GOAT MILK) CAPS Take 1 capsule by mouth 2 (two) times daily.   Yes Historical Provider, MD  tiotropium (SPIRIVA) 18 MCG inhalation capsule Place 18 mcg into inhaler and inhale daily.   Yes Historical Provider, MD  IBUPROFEN PO Take 1 tablet by mouth every 6 (six) hours as needed (pain).    Historical Provider, MD  Rivaroxaban (XARELTO) 20 MG TABS tablet Take 20 mg by mouth daily with supper.  05/11/13   Historical Provider, MD   BP 155/80 mmHg  Pulse 62  Temp(Src) 98.2 F (36.8 C) (Oral)  Resp 19  Ht  (1.651 m)  Wt 150 lb (68.04 kg)  BMI 24.96 kg/m2  SpO2 99% Physical Exam  Constitutional: No distress.  HENT:  Head: Normocephalic and atraumatic.  Right Ear: External ear normal.  Left Ear: External ear normal.  Eyes: Conjunctivae are normal. Right eye exhibits no discharge. Left eye exhibits no discharge. No scleral icterus.  Neck: Neck supple. No tracheal deviation present.  Cardiovascular: Normal rate, regular rhythm and intact distal pulses.   Pulmonary/Chest: Effort normal and breath sounds normal. No stridor. No respiratory distress. She has no wheezes. She has no rales.  Abdominal: Soft. Bowel sounds are normal. She exhibits no distension. There is no tenderness. There is no rebound and no guarding.   Musculoskeletal: She exhibits no edema or tenderness.  Neurological: She is alert. She has normal strength. No cranial nerve deficit (no facial droop, extraocular movements intact, no slurred speech) or sensory deficit. She exhibits normal muscle tone. She displays no seizure activity. Coordination normal.  Skin: Skin is warm and dry. No rash noted.  Psychiatric: She has a normal mood and affect.  Nursing note and vitals reviewed.   ED Course  Procedures (including critical care time) Labs Review Labs Reviewed  COMPREHENSIVE METABOLIC PANEL - Abnormal; Notable for the following:    Creatinine, Ser 1.17 (*)    GFR calc non Af Amer 43 (*)  GFR calc Af Amer 50 (*)    All other components within normal limits  PROTIME-INR - Abnormal; Notable for the following:    Prothrombin Time 19.7 (*)    INR 1.67 (*)    All other components within normal limits  BRAIN NATRIURETIC PEPTIDE - Abnormal; Notable for the following:    B Natriuretic Peptide 120.7 (*)    All other components within normal limits  APTT  CBC  D-DIMER, QUANTITATIVE (NOT AT [facility])  Rosezena Sensor, ED    Imaging Review Dg Chest 2 View  12/04/2014   CLINICAL DATA:  81 year old female recently cardioversion x2. Dizziness, shortness of breath, nonproductive cough. Initial encounter.  EXAM: CHEST  2 VIEW  COMPARISON:  04/17/2014 and earlier.  FINDINGS: Upright AP and lateral views of the chest. Chronic increased AP dimension to the chest and exaggerated thoracic kyphosis. Stable mild cardiomegaly. Stable mediastinal contours. Mild apical scarring appears stable. No pneumothorax, pulmonary edema, pleural effusion, or confluent pulmonary opacity. No acute osseous abnormality identified. Calcified atherosclerosis of the aorta.  IMPRESSION: No acute cardiopulmonary abnormality.   Electronically Signed   By: Odessa Fleming M.D.   On: 12/04/2014 10:32   Ct Head Wo Contrast  12/04/2014   CLINICAL DATA:  Dizziness.  EXAM: CT HEAD WITHOUT  CONTRAST  TECHNIQUE: Contiguous axial images were obtained from the base of the skull through the vertex without intravenous contrast.  COMPARISON:  Previous examinations, the most recent dated 06/24/2013.  FINDINGS: Diffusely enlarged ventricles and subarachnoid spaces. Patchy white matter low density in both cerebral hemispheres. 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 Central pontine low density has not changed significantly. No intracranial hemorrhage, mass lesion or CT evidence of acute infarction. Unremarkable bones and included paranasal sinuses. Stable probable sebaceous cyst in the right frontal scalp.  IMPRESSION: 1. No acute abnormality. 2. 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   Electronically Signed   By: Beckie Salts M.D.   On: 12/04/2014 10:43   I have personally reviewed and evaluated these images and lab results as part of my medical decision-making.   EKG Interpretation   Date/Time:  Monday December 04 2014 08:27:42 EDT Ventricular Rate:  66 PR Interval:  180 QRS Duration: 88 QT Interval:  404 QTC Calculation: 423 R Axis:   64 Text Interpretation:  Sinus rhythm with Premature atrial complexes  Nonspecific ST abnormality Abnormal ECG Confirmed by Elfreida Heggs  MD-J, Saige Busby  ([id number]) on 12/04/2014 8:53:05 AM      MDM   Final diagnoses:  Dyspnea    The patient's laboratory tests, chest x-ray, EKG and vital signs are normal. Patient is not tachypneic or hypoxic. I doubt pulmonary embolism, pneumonia, congestive heart failure, or other acute cardio pulmonary process. Patient does have history of COPD. She uses inhalers. I will have her try increasing her use of her inhaler over the next few days to see if her symptoms improved.  Regarding her dizziness, she has no focal neurologic symptoms. Patient's husband states that times when they checked her blood  pressure at home is very low. Under 100 systolic. She is not hypotensive here and is in fact hypertensive. I will have her continue to monitor her blood pressure. I recommended close follow-up with her primary care doctor in the next couple of days.  At this time there does not appear to be any evidence of an acute emergency medical condition and the patient  appears stable for discharge with appropriate outpatient follow up.  She is able to walk without difficulty     Linwood Dibbles, MD 12/04/14 1115

## 2014-12-07 ENCOUNTER — Telehealth: Payer: Self-pay | Admitting: Cardiovascular Disease

## 2014-12-07 NOTE — Telephone Encounter (Signed)
Clearance was signed by Dr. Elease Hashimoto and placed in HIM for faxing

## 2014-12-07 NOTE — Telephone Encounter (Signed)
Follow Up  Office states that they can receive a verbal. Or you can refax because they have not received it. Please call

## 2014-12-07 NOTE — Telephone Encounter (Signed)
New message      Request for surgical clearance:  What type of surgery is being performed? colonscopy 1. When is this surgery scheduled? 12-08-14  Are there any medications that need to be held prior to surgery and how long? Ok to hold xarelto this evening Name of physician performing surgery? Dr Buccini 2. What is your office phone and fax number?  Fax [phone]

## 2014-12-08 ENCOUNTER — Other Ambulatory Visit: Payer: Self-pay | Admitting: Gastroenterology

## 2014-12-20 ENCOUNTER — Telehealth: Payer: Self-pay | Admitting: Cardiovascular Disease

## 2014-12-20 NOTE — Telephone Encounter (Signed)
[facility] to [facility] at [facility]. The office is currently closed. Will have triage attempt to follow up tomorrow.

## 2014-12-20 NOTE — Telephone Encounter (Signed)
New Message   Pt c/o BP issue: STAT if pt c/o blurred vision, one-sided weakness or slurred speech  1. What are your last 5 BP readings? 10/18    101/57    An around  84/47  2. Are you having any other symptoms (ex. Dizziness, headache, blurred vision, passed out)? Dizzy light headed lower leg ankle edema and weak  3. What is your BP issue? Low BP

## 2014-12-21 NOTE — Progress Notes (Signed)
Cardiology Office Note   Date:  12/22/2014   ID:  Angela Blackburn, DOB 09/12/1932, MRN [id number]  PCP:  [name],MICHAEL C, MD  Cardiologist:  Dr. Delane GingerPhil Nahser   Electrophysiologist:  n/a  Chief Complaint  Patient presents with  . Other    Low BP     History of Present Illness: Angela ChildLucille S Blackburn is a 81 y.o. female with a hx of HTN, HL, PAF with prior cardioversion, syncope in 5/15, hypothyroidism, chest pain with negative Myoview 11/09. She has a history of rash on Eliquis and was transitioned to Xarelto. Last seen by Dr. Elease HashimotoNahser 8/16.  She was seen in the emergency room 12/04/14 with dizziness and shortness of breath. BNP was minimally elevated at 120.7. Troponin was negative. D-dimer was negative. Chest CT no acute findings. Chest x-ray demonstrated no acute findings. Patient called in recently with low blood pressures. She returns for follow-up.  Received noted from PCP.  BP on 12/18/14 was 101/57.  Labs:  BUN 23, SCr 1.25, K 5.4, Ca 9.2, Alb 4, ALT 24, TSH 3.260, Hgb 13.  She started having dizziness when she went to the ED on 10/3.  She denies syncope. She describes her dizziness as a spinning sensation.  She denies any relation to positional changes.  She notes dizziness after prolonged standing.  She shows me a list of her BPs at home. She had her monitor checked with a Hg cuff in the past.  Her BPs range 153-89 to 67/46.  She denies palpitations.  She does not typically have symptoms in AFib.  She has a hx of chronic L sided chest pain off an on. It is unchanged.  She did have an episode of chest discomfort that awoke her from sleep a few weeks ago.  She has had this in the past. It radiated to her jaw and bilateral shoulders.  She has chronic SOB from COPD.  No changes.  She denies orthopnea, PND, edema.      Studies/Reports Reviewed Today:  Echo 11/08/14 EF 55-60%, normal wall motion, normal diastolic function, MAC, mild MR, moderate TR, PASP 42 mmHg  Event Monitor 4/15 NSR,  PVCs  Echo 10/14 EF 60-65%, mild MR, mild RAE, PASP 42.  Myoview 10/13 No ischemia, EF 82%  LHC 8/01 Normal cors    Past Medical History  Diagnosis Date  . Chest pain     Hx  . Hypothyroidism   . Hyperlipidemia     no meds  . Knee pain   . Peripheral neuropathy (HCC)   . Fibromyalgia   . PAF (paroxysmal atrial fibrillation) (HCC)     a. 06/2012 Flecainide and Xarelto started;  b. 06/2012 DCCV;  c. 06/2012 Syncope in setting of bradycardia->flecainide and dilt d/c'd->Multaq started.  . Syncope     a. 06/2012 - in setting of bradycardia following dccv->flecainide and dilt d/c'd.  . SVD (spontaneous vaginal delivery)   . COPD (chronic obstructive pulmonary disease) (HCC)     uses inhaler  . Arthritis     knees/hands  . Hypertension     no meds currently - Dr Melburn PopperNasher    Past Surgical History  Procedure Laterality Date  . [place] echocardiography  09/14/2007    EF 55-60%  . Cardiovascular stress test  08/29/2009    EF 86%  . Abdominal hysterectomy    . Joint replacement      Lt. TKA in 2010  . Cardioversion N/A 01/11/2013    Procedure: CARDIOVERSION;  Surgeon: Madolyn FriezeBrian S  Jens Som, MD;  Location[facility] ENDOSCOPY;  Service: Cardiovascular;  Laterality: N/A;  . Cardioversion N/A 06/22/2013    Procedure: CARDIOVERSION;  Surgeon: Vesta Mixer, MD;  Location: [facility] ENDOSCOPY;  Service: Cardiovascular;  Laterality: N/A;  . Colonoscopy    . Anterior and posterior repair N/A 12/21/2013    Procedure: ANTERIOR (CYSTOCELE) AND POSTERIOR REPAIR (RECTOCELE);  Surgeon: Lavina Hamman, MD;  Location: [facility] ORS;  Service: Gynecology;  Laterality: N/A;     Current Outpatient Prescriptions  Medication Sig Dispense Refill  . acetaminophen (TYLENOL) 500 MG tablet Take 1,000 mg by mouth every 6 (six) hours as needed for pain.    [name] albuterol (PROAIR HFA) 108 (90 BASE) MCG/ACT inhaler Inhale 2 puffs into the lungs every 6 (six) hours as needed for shortness of breath.     . calcium carbonate (OS-CAL) 600 MG  TABS tablet Take 600 mg by mouth daily with breakfast.    . cephALEXin (KEFLEX) 250 MG capsule Take 250 mg by mouth daily.    . Cholecalciferol (VITAMIN D) 2000 UNITS tablet Take 2,000 Units by mouth daily.     . Cranberry Extract 250 MG TABS Take 250 mg by mouth 3 (three) times daily.    [name] docusate sodium (COLACE) 100 MG capsule Take 200 mg by mouth daily.     . fish oil-omega-3 fatty acids 1000 MG capsule Take 1 g by mouth daily.    [name] KLOR-CON M20 20 MEQ tablet TAKE 1 TABLET (20 MEQ TOTAL) BY MOUTH DAILY. 30 tablet 11  . levothyroxine (SYNTHROID, LEVOTHROID) 125 MCG tablet Take 125 mcg by mouth daily.      . methylcellulose (ARTIFICIAL TEARS) 1 % ophthalmic solution Place 1 drop into both eyes daily as needed (for dry eyes).     . MULTAQ 400 MG tablet TAKE 1 TABLET (400 MG TOTAL) BY MOUTH 2 (TWO) TIMES DAILY WITH A MEAL. 60 tablet 5  . Multiple Vitamins-Minerals (ICAPS) CAPS Take 1 capsule by mouth daily.    . nortriptyline (PAMELOR) 75 MG capsule Take 75 mg by mouth at bedtime.      . polyethylene glycol (MIRALAX / GLYCOLAX) packet Take 17 g by mouth daily as needed for mild constipation.    . Probiotic Product (ACIDOPHILUS/GOAT MILK) CAPS Take 1 capsule by mouth 2 (two) times daily.    [name] tiotropium (SPIRIVA) 18 MCG inhalation capsule Place 18 mcg into inhaler and inhale daily.    . Rivaroxaban (XARELTO) 15 MG TABS tablet Take 1 tablet (15 mg total) by mouth daily with supper. 30 tablet 11   No current facility-administered medications for this visit.    Allergies:   Metoprolol; Crestor; Lipitor; Livalo; Other; Zetia; Lisinopril; Penicillins; Statins; and Eliquis    Social History:  The patient  reports that she has never smoked. She has never used smokeless tobacco. She reports that she does not drink alcohol or use illicit drugs.   Family History:  The patient's family history includes Heart failure in her mother.    ROS:   Please see the history of present illness.   Review of  Systems  Constitution: Positive for decreased appetite, malaise/fatigue and weight loss.  Cardiovascular: Positive for dyspnea on exertion.  Respiratory: Positive for cough and shortness of breath.   Gastrointestinal: Positive for constipation.  All other systems reviewed and are negative.     PHYSICAL EXAM: VS:  BP 148/78 mmHg  Pulse 63  Ht  (1.651 m)  Wt 151 lb 6.4 oz (68.675 kg)  BMI 25.19 kg/m2    Orthostatic VS for the past 24 hrs:  BP- Lying Pulse- Lying BP- Sitting Pulse- Sitting BP- Standing at 0 minutes Pulse- Standing at 0 minutes  12/22/14 0954 170/87 mmHg 58 149/83 mmHg 59 100/66 mmHg 60    Wt Readings from Last 3 Encounters:  12/22/14 151 lb 6.4 oz (68.675 kg)  12/04/14 150 lb (68.04 kg)  10/30/14 157 lb 12.8 oz (71.578 kg)     GEN: Well nourished, well developed, in no acute distress HEENT: normal Neck: no JVD,  no masses Cardiac:  Normal S1/S2, RRR; no murmur ,  no rubs or gallops, trace bilateral ankle edema   Respiratory:  clear to auscultation bilaterally, no wheezing, rhonchi or rales. GI: soft, nontender, nondistended, + BS MS: no deformity or atrophy Skin: warm and dry  Neuro:  CNs II-XII intact, Strength and sensation are intact Psych: Normal affect   EKG:  EKG is ordered today.  It demonstrates:   NSR, HR 63, normal axis, NSSTTW changes, Q waves V1-2, QTc 415 ms, no changes   Recent Labs: 12/04/2014: ALT 26; B Natriuretic Peptide 120.7*; BUN 16; Creatinine, Ser 1.17*; Hemoglobin 13.9; Platelets 222; Potassium 4.0; Sodium 136    Lipid Panel    Component Value Date/Time   CHOL 170 10/30/2014 0918   TRIG 87.0 10/30/2014 0918   HDL 56.80 10/30/2014 0918   CHOLHDL 3 10/30/2014 0918   VLDL 17.4 10/30/2014 0918   LDLCALC 96 10/30/2014 0918   LDLDIRECT 121.6 10/01/2011 0926      ASSESSMENT AND PLAN:  1. Dizziness:  Orthostatic VS today with BP drop of 170 >> 100 from lying to standing. She is symptomatic. Suspect her symptoms are related  to orthostatic hypotension.  She is not on any BP meds or diuretics. She does have supine HTN.   -  Start compression stockings QD  -  FU with me in 2 weeks (with stockings on) to recheck orthostaticVS  -  May need to consider Mestinon to avoid supine HTN and treat orthostatic hypotension.  -  If spinning continues with correction of orthostasis, consider PT referral for vestibular rehab.    2. PAF:  Remains in NSR. Continue Multaq.  Her Creatinine Clearance is 38.81.  With Cr Cl < 50, will change Xarelto to 15 mg QD. Recent Hgb ok.   3. Hyperlipidemia:  Intol of statins.  4. COPD:  FU with PCP.  5. Chest Pain:  Recent CP concerning for angina.  Normal cath in 2001.  Neg Myoview 2013.  Will check Lexiscan Myoview.       Medication Changes: Current medicines are reviewed at length with the patient today.  Concerns regarding medicines are as outlined above.  The following changes have been made:   Discontinued Medications   IBUPROFEN PO    Take 1 tablet by mouth every 6 (six) hours as needed (pain).   RIVAROXABAN (XARELTO) 20 MG TABS TABLET    Take 20 mg by mouth daily with supper.    Modified Medications   No medications on file   New Prescriptions   RIVAROXABAN (XARELTO) 15 MG TABS TABLET    Take 1 tablet (15 mg total) by mouth daily with supper.   Labs/ tests ordered today include:   Orders Placed This Encounter  Procedures  . Myocardial Perfusion Imaging  . EKG 12-Lead      Disposition:    FU with me in 2 weeks and Dr. Delane Ginger 6-8 weeks.  Signed, Brynda Rim, MHS 12/22/2014 10:12 AM    [facility] [address] [place], [place], [place]  [address] Phone: [phone]; Fax: [phone]

## 2014-12-21 NOTE — Telephone Encounter (Signed)
Spoke with Morrie [facility]ld[facility]Ashley at [facility] They would like pt to be seen sooner than planned 6 month follow up with Dr. Elease HashimotoNahser.  Will contact pt to schedule this appt. I spoke with pt who reports she has been having problems with low blood pressure for the last week.  Appt made for pt to see Tereso NewcomerScott Weaver, PA tomorrow at 8:50.  I called [facility] and asked them to send office note and any labs/EKG that was done at office visit.

## 2014-12-21 NOTE — Telephone Encounter (Signed)
Agree with plans to have her see Angela NewcomerScott Weaver tomorrow to adjust BP meds as needed

## 2014-12-22 ENCOUNTER — Other Ambulatory Visit: Payer: Medicare Other

## 2014-12-22 ENCOUNTER — Telehealth: Payer: Self-pay | Admitting: Physician Assistant

## 2014-12-22 ENCOUNTER — Encounter: Payer: Self-pay | Admitting: Physician Assistant

## 2014-12-22 ENCOUNTER — Ambulatory Visit (INDEPENDENT_AMBULATORY_CARE_PROVIDER_SITE_OTHER): Payer: Medicare Other | Admitting: Physician Assistant

## 2014-12-22 VITALS — BP 148/78 | HR 63 | Ht 65.0 in | Wt 151.4 lb

## 2014-12-22 DIAGNOSIS — I480 Paroxysmal atrial fibrillation: Secondary | ICD-10-CM

## 2014-12-22 DIAGNOSIS — I10 Essential (primary) hypertension: Secondary | ICD-10-CM | POA: Diagnosis not present

## 2014-12-22 DIAGNOSIS — R072 Precordial pain: Secondary | ICD-10-CM

## 2014-12-22 DIAGNOSIS — E785 Hyperlipidemia, unspecified: Secondary | ICD-10-CM

## 2014-12-22 DIAGNOSIS — I951 Orthostatic hypotension: Secondary | ICD-10-CM

## 2014-12-22 DIAGNOSIS — J449 Chronic obstructive pulmonary disease, unspecified: Secondary | ICD-10-CM | POA: Diagnosis not present

## 2014-12-22 MED ORDER — RIVAROXABAN 15 MG PO TABS: 15.0000 mg | ORAL_TABLET | Freq: Every day | ORAL | Status: AC

## 2014-12-22 NOTE — Telephone Encounter (Signed)
error 

## 2014-12-22 NOTE — Patient Instructions (Signed)
Medication Instructions:  Your physician has recommended you make the following change in your medication:  1. Start Xarelto ( 15 mg ) daily with supper   Labwork: -None  Testing/Procedures: Your physician has requested that you have a lexiscan myoview. For further information please visit [email or web address]. Please follow instruction sheet, as given.    Follow-Up: Your physician recommends that you keep your scheduled  follow-up appointment with Tereso NewcomerScott Weaver, PA-C on November 7. Your physician recommends that you keep your scheduled  follow-up appointment with Dr. Elease HashimotoNahser on December 9.   Any Other Special Instructions Will Be Listed Below (If Applicable).  Wear Compression hose daily. Pump calf muscles and get up slowly.  Wear compression to office visit on November 7 when you see Tereso NewcomerScott Weaver, PA-C

## 2014-12-29 ENCOUNTER — Encounter (HOSPITAL_COMMUNITY): Payer: Medicare Other

## 2015-01-01 ENCOUNTER — Telehealth (HOSPITAL_COMMUNITY): Payer: Self-pay | Admitting: *Deleted

## 2015-01-01 NOTE — Telephone Encounter (Signed)
Patient given detailed instructions per Myocardial Perfusion Study Information Sheet for the test on 01/03/15 at 0745. Patient notified to arrive 15 minutes early and that it is imperative to arrive on time for appointment to keep from having the test rescheduled.  If you need to cancel or reschedule your appointment, please call the office within 24 hours of your appointment. Failure to do so may result in a cancellation of your appointment, and a $50 no show fee. Patient verbalized understanding.Angela Blackburn, Adelene IdlerCynthia W

## 2015-01-03 ENCOUNTER — Ambulatory Visit (HOSPITAL_COMMUNITY): Payer: Medicare Other | Attending: Cardiology

## 2015-01-03 ENCOUNTER — Telehealth: Payer: Self-pay | Admitting: *Deleted

## 2015-01-03 ENCOUNTER — Encounter: Payer: Self-pay | Admitting: Physician Assistant

## 2015-01-03 DIAGNOSIS — R079 Chest pain, unspecified: Secondary | ICD-10-CM | POA: Diagnosis present

## 2015-01-03 DIAGNOSIS — E785 Hyperlipidemia, unspecified: Secondary | ICD-10-CM | POA: Diagnosis not present

## 2015-01-03 DIAGNOSIS — R0602 Shortness of breath: Secondary | ICD-10-CM | POA: Insufficient documentation

## 2015-01-03 DIAGNOSIS — J449 Chronic obstructive pulmonary disease, unspecified: Secondary | ICD-10-CM | POA: Diagnosis not present

## 2015-01-03 DIAGNOSIS — I951 Orthostatic hypotension: Secondary | ICD-10-CM | POA: Insufficient documentation

## 2015-01-03 DIAGNOSIS — I480 Paroxysmal atrial fibrillation: Secondary | ICD-10-CM | POA: Diagnosis not present

## 2015-01-03 DIAGNOSIS — I10 Essential (primary) hypertension: Secondary | ICD-10-CM | POA: Diagnosis not present

## 2015-01-03 LAB — MYOCARDIAL PERFUSION IMAGING
CHL CUP NUCLEAR SSS: 4
CSEPPHR: 71 {beats}/min
LHR: 0.33
LVDIAVOL: 83 mL
LVSYSVOL: 21 mL
NUC STRESS TID: 0.99
Rest HR: 63 {beats}/min
SDS: 1
SRS: 3

## 2015-01-03 MED ORDER — TECHNETIUM TC 99M SESTAMIBI GENERIC - CARDIOLITE
32.3000 | Freq: Once | INTRAVENOUS | Status: AC | PRN
  Administered 2015-01-03: 32.3 via INTRAVENOUS

## 2015-01-03 MED ORDER — REGADENOSON 0.4 MG/5ML IV SOLN
0.4000 mg | Freq: Once | INTRAVENOUS | Status: AC
Start: 2015-01-03 — End: 2015-01-03
  Administered 2015-01-03: 0.4 mg via INTRAVENOUS

## 2015-01-03 MED ORDER — TECHNETIUM TC 99M SESTAMIBI GENERIC - CARDIOLITE
10.8000 | Freq: Once | INTRAVENOUS | Status: AC | PRN
  Administered 2015-01-03: 11 via INTRAVENOUS

## 2015-01-03 NOTE — Telephone Encounter (Signed)
Pt notified of myoview results. Advised pt to keep appt 11/7 for f/u..[name]

## 2015-01-07 NOTE — Progress Notes (Signed)
Cardiology Office Note   Date:  01/08/2015   ID:  Angela Blackburn, DOB 1932-08-27, MRN [id number]  PCP:  Angela Inch, MD  Cardiologist:  Dr. Delane Blackburn   Electrophysiologist:  n/a  Chief Complaint  Patient presents with  . Follow-up    orthostatic hypotension     History of Present Illness: Angela Blackburn is a 81 y.o. female with a hx of HTN, HL, PAF with prior cardioversion, syncope in 5/15, hypothyroidism, chest pain with negative Myoview 11/09. She has a history of rash on Eliquis and was transitioned to Xarelto.   She was seen in the emergency room 12/04/14 with dizziness and shortness of breath. BNP was minimally elevated at 120.7. Troponin was negative. D-dimer was negative. Chest CT no acute findings. Chest x-ray demonstrated no acute findings.   Labs from PCP 12/18/14:  BUN 23, SCr 1.25, K 5.4, Ca 9.2, Alb 4, ALT 24, TSH 3.260, Hgb 13.  I saw her 12/22/14. She had significant blood pressure drop from lying to standing. I asked her to wear compression stockings. She also complained of chest pain and stress test was obtained. This was low risk with no ischemia and normal EF. She returns for follow-up.   She has been wearing her compression stockings. She's been trying to get up slowly. She feels better. Denies dizziness. Denies syncope. Denies orthopnea, PND or edema. She notes that her breathing is improved. She denies chest pain.   Studies/Reports Reviewed Today:  Myoview 11/16 EF 75%, no ischemia  Echo 11/08/14 EF 55-60%, normal wall motion, normal diastolic function, MAC, mild MR, moderate TR, PASP 42 mmHg  Event Monitor 4/15 NSR, PVCs  Echo 10/14 EF 60-65%, mild MR, mild RAE, PASP 42.  Myoview 10/13 No ischemia, EF 82%  LHC 8/01 Normal cors    Past Medical History  Diagnosis Date  . Chest pain     Hx  . Hypothyroidism   . Hyperlipidemia     no meds  . Knee pain   . Peripheral neuropathy (HCC)   . Fibromyalgia   . PAF (paroxysmal atrial  fibrillation) (HCC)     a. 06/2012 Flecainide and Xarelto started;  b. 06/2012 DCCV;  c. 06/2012 Syncope in setting of bradycardia->flecainide and dilt d/c'd->Multaq started.  . Syncope     a. 06/2012 - in setting of bradycardia following dccv->flecainide and dilt d/c'd.  . SVD (spontaneous vaginal delivery)   . COPD (chronic obstructive pulmonary disease) (HCC)     uses inhaler  . Arthritis     knees/hands  . Hypertension     no meds currently - Dr Melburn Popper  . History of nuclear stress test     Myoview 11/16: EF 75%, no ischemia. Low Risk    Past Surgical History  Procedure Laterality Date  . US echocardiography  09/14/2007    EF 55-60%  . Cardiovascular stress test  08/29/2009    EF 86%  . Abdominal hysterectomy    . Joint replacement      Lt. TKA in 2010  . Cardioversion N/A 01/11/2013    Procedure: CARDIOVERSION;  Surgeon: Lewayne Bunting, MD;  Location: [facility] ENDOSCOPY;  Service: Cardiovascular;  Laterality: N/A;  . Cardioversion N/A 06/22/2013    Procedure: CARDIOVERSION;  Surgeon: Angela Mixer, MD;  Location: [facility] ENDOSCOPY;  Service: Cardiovascular;  Laterality: N/A;  . Colonoscopy    . Anterior and posterior repair N/A 12/21/2013    Procedure: ANTERIOR (CYSTOCELE) AND POSTERIOR REPAIR (RECTOCELE);  Surgeon: Angela Claiborne,  MD;  Location: [facility] ORS;  Service: Gynecology;  Laterality: N/A;     Current Outpatient Prescriptions  Medication Sig Dispense Refill  . acetaminophen (TYLENOL) 500 MG tablet Take 1,000 mg by mouth every 6 (six) hours as needed for pain.    [name] albuterol (PROAIR HFA) 108 (90 BASE) MCG/ACT inhaler Inhale 2 puffs into the lungs every 6 (six) hours as needed for shortness of breath.     . calcium carbonate (OS-CAL) 600 MG TABS tablet Take 600 mg by mouth daily with breakfast.    . cephALEXin (KEFLEX) 250 MG capsule Take 250 mg by mouth daily.    . Cholecalciferol (VITAMIN D) 2000 UNITS tablet Take 2,000 Units by mouth daily.     . Cranberry Extract 250 MG TABS Take  250 mg by mouth 3 (three) times daily.    [name] docusate sodium (COLACE) 100 MG capsule Take 200 mg by mouth daily.     . fish oil-omega-3 fatty acids 1000 MG capsule Take 1 g by mouth daily.    [name] KLOR-CON M20 20 MEQ tablet TAKE 1 TABLET (20 MEQ TOTAL) BY MOUTH DAILY. 30 tablet 11  . levothyroxine (SYNTHROID, LEVOTHROID) 125 MCG tablet Take 125 mcg by mouth daily.      . methylcellulose (ARTIFICIAL TEARS) 1 % ophthalmic solution Place 1 drop into both eyes daily as needed (for dry eyes).     . MULTAQ 400 MG tablet TAKE 1 TABLET (400 MG TOTAL) BY MOUTH 2 (TWO) TIMES DAILY WITH A MEAL. 60 tablet 5  . nortriptyline (PAMELOR) 75 MG capsule Take 75 mg by mouth at bedtime.      . polyethylene glycol (MIRALAX / GLYCOLAX) packet Take 17 g by mouth daily as needed for mild constipation.    . Probiotic Product (ACIDOPHILUS/GOAT MILK) CAPS Take 1 capsule by mouth 2 (two) times daily.    . Rivaroxaban (XARELTO) 15 MG TABS tablet Take 1 tablet (15 mg total) by mouth daily with supper. 30 tablet 11  . tiotropium (SPIRIVA) 18 MCG inhalation capsule Place 18 mcg into inhaler and inhale daily.     No current facility-administered medications for this visit.    Allergies:   Metoprolol; Crestor; Lipitor; Livalo; Other; Zetia; Lisinopril; Penicillins; Statins; and Eliquis    Social History:  The patient  reports that she has never smoked. She has never used smokeless tobacco. She reports that she does not drink alcohol or use illicit drugs.   Family History:  The patient's family history includes Heart failure in her mother.    ROS:   Please see the history of present illness.   Review of Systems  Constitution: Positive for chills.  HENT: Positive for headaches.   Eyes: Positive for visual disturbance.  Cardiovascular: Positive for dyspnea on exertion, irregular heartbeat and leg swelling.  Respiratory: Positive for cough and snoring.   Hematologic/Lymphatic: Bruises/bleeds easily.  Musculoskeletal:  Positive for back pain and myalgias.  Gastrointestinal: Positive for abdominal pain and constipation.  Neurological: Positive for dizziness and loss of balance.  All other systems reviewed and are negative.     PHYSICAL EXAM: VS:  BP 186/84 mmHg  Pulse 64  Ht  (1.651 m)  Wt 157 lb (71.215 kg)  BMI 26.13 kg/m2  SpO2 97%    Orthostatic VS for the past 24 hrs:  BP- Lying Pulse- Lying BP- Sitting Pulse- Sitting BP- Standing at 0 minutes Pulse- Standing at 0 minutes  01/08/15 0944 161/85 mmHg 65 179/85 mmHg 64 152/82 mmHg  62    Wt Readings from Last 3 Encounters:  01/08/15 157 lb (71.215 kg)  01/03/15 151 lb (68.493 kg)  12/22/14 151 lb 6.4 oz (68.675 kg)     GEN: Well nourished, well developed, in no acute distress HEENT: normal Neck: no JVD,  no masses Cardiac:  Normal S1/S2, RRR; no murmur ,  no rubs or gallops, no edema; compression stockings in place Respiratory:  clear to auscultation bilaterally, no wheezing, rhonchi or rales. GI: soft, nontender, nondistended, + BS MS: no deformity or atrophy Skin: warm and dry  Neuro:  CNs II-XII intact, Strength and sensation are intact Psych: Normal affect   EKG:  EKG is not ordered today.  It demonstrates:   n/a   Recent Labs: 12/04/2014: ALT 26; B Natriuretic Peptide 120.7*; BUN 16; Creatinine, Ser 1.17*; Hemoglobin 13.9; Platelets 222; Potassium 4.0; Sodium 136    Lipid Panel    Component Value Date/Time   CHOL 170 10/30/2014 0918   TRIG 87.0 10/30/2014 0918   HDL 56.80 10/30/2014 0918   CHOLHDL 3 10/30/2014 0918   VLDL 17.4 10/30/2014 0918   LDLCALC 96 10/30/2014 0918   LDLDIRECT 121.6 10/01/2011 0926      ASSESSMENT AND PLAN:  1. Orthostatic Hypotension:  At last visit, her SBP dropped 170 >>> 100 from lying to standing.  Today her blood pressure dropped from 161 lying to 131 standing for 3 minutes. She brought in her blood pressure machine today. It matches with our readings in the office. Her blood pressure  at home however is much lower and it appears that she has "white coat hypertension."  Blood pressures at home range anywhere from 96 up to 140. As her symptoms are improved I would recommend that we continue with conservative management which includes compression stockings. I have also encouraged her to increase the elevation of the head of her bed. If she continues to have symptoms with orthostatic hypotension, we could consider adding Mestinon.  Another option would be to have her check her orthostatics at home to determine what is her true supine BP.  If it is not elevated, we could try to use Florinef or Midodrine.  I have encouraged her to continue to check her BPs at home.   2. PAF:    Continue Multaq.  Her Creatinine Clearance remains < 50.  With Cr Cl < 50, at last visit I changed her Xarelto to 15 mg QD. Recent Hgb ok.   3. Hyperlipidemia:  Intol of statins.  4. COPD:  FU with PCP.  5. Chest Pain:   Normal cath in 2001.  Neg Myoview 2013.  Recent Lexiscan Myoview neg for ischemia. No further chest pain. No further cardiac workup needed.         Medication Changes: Current medicines are reviewed at length with the patient today.  Concerns regarding medicines are as outlined above.  The following changes have been made:   Discontinued Medications   MULTIPLE VITAMINS-MINERALS (ICAPS) CAPS    Take 1 capsule by mouth daily.   Modified Medications   No medications on file   New Prescriptions   No medications on file   Labs/ tests ordered today include:   No orders of the defined types were placed in this encounter.     Disposition:    FU Dr. Delane GingerPhil Nahser next month as planned.      Signed, Brynda RimScott Elke Holtry, PA-C, MHS 01/08/2015 10:38 AM    [facility] [address]  [address] [place], [place]  [address] Phone: [phone]; Fax: [phone]

## 2015-01-08 ENCOUNTER — Ambulatory Visit (INDEPENDENT_AMBULATORY_CARE_PROVIDER_SITE_OTHER): Payer: Medicare Other | Admitting: Physician Assistant

## 2015-01-08 ENCOUNTER — Encounter: Payer: Self-pay | Admitting: Physician Assistant

## 2015-01-08 VITALS — BP 186/84 | HR 64 | Ht 65.0 in | Wt 157.0 lb

## 2015-01-08 DIAGNOSIS — E785 Hyperlipidemia, unspecified: Secondary | ICD-10-CM | POA: Diagnosis not present

## 2015-01-08 DIAGNOSIS — I480 Paroxysmal atrial fibrillation: Secondary | ICD-10-CM | POA: Diagnosis not present

## 2015-01-08 DIAGNOSIS — I951 Orthostatic hypotension: Secondary | ICD-10-CM

## 2015-01-08 DIAGNOSIS — R072 Precordial pain: Secondary | ICD-10-CM

## 2015-01-08 DIAGNOSIS — J449 Chronic obstructive pulmonary disease, unspecified: Secondary | ICD-10-CM

## 2015-01-08 NOTE — Patient Instructions (Signed)
Medication Instructions:  Your physician recommends that you continue on your current medications as directed. Please refer to the Current Medication list given to you today.   Labwork: NONE  Testing/Procedures: NONE  Follow-Up: KEEP APPT 02/2015 WITH DR. Melburn PopperNASHER AS PLANNED  Any Other Special Instructions Will Be Listed Below (If Applicable). KEEP CHECK ON BP AND BRING READINGS TO NEXT APPT    If you need a refill on your cardiac medications before your next appointment, please call your pharmacy.

## 2015-01-18 ENCOUNTER — Other Ambulatory Visit: Payer: Self-pay | Admitting: Cardiovascular Disease

## 2015-02-07 ENCOUNTER — Other Ambulatory Visit: Payer: Self-pay | Admitting: Cardiovascular Disease

## 2015-02-09 ENCOUNTER — Ambulatory Visit (INDEPENDENT_AMBULATORY_CARE_PROVIDER_SITE_OTHER): Payer: Medicare Other | Admitting: Cardiovascular Disease

## 2015-02-09 ENCOUNTER — Encounter: Payer: Self-pay | Admitting: Cardiovascular Disease

## 2015-02-09 VITALS — BP 190/96 | HR 63 | Ht 65.0 in | Wt 160.0 lb

## 2015-02-09 DIAGNOSIS — I481 Persistent atrial fibrillation: Secondary | ICD-10-CM | POA: Diagnosis not present

## 2015-02-09 DIAGNOSIS — I4819 Other persistent atrial fibrillation: Secondary | ICD-10-CM

## 2015-02-09 NOTE — Patient Instructions (Signed)
Medication Instructions:  Your physician recommends that you continue on your current medications as directed. Please refer to the Current Medication list given to you today.   Labwork: None Ordered   Testing/Procedures: None Ordered   Follow-Up: Your physician wants you to follow-up in: 6 months with Dr. Nahser.  You will receive a reminder letter in the mail two months in advance. If you don't receive a letter, please call our office to schedule the follow-up appointment.   If you need a refill on your cardiac medications before your next appointment, please call your pharmacy.   Thank you for choosing [facility]! Aviyah Swetz, RN [phone]    

## 2015-02-09 NOTE — Progress Notes (Signed)
Cardiology Office Note   Date:  02/09/2015   ID:  Angela ChildLucille S Dolle, DOB 03/25/1932, MRN [id number]  PCP:  Angela Blackburn,MICHAEL C, MD  Cardiologist:   Vesta MixerNahser, Philip J, MD   Chief Complaint  Patient presents with  . Follow-up   1. Hypertension 2. History chest pains-negative stress Myoview study in November, 2009 3. Hypothyroidism 4. Hypercholesterolemia 5. Peripheral neuropathy 6. Atrial fibrillation-currently on his Xarelto to prevent thromboembolus. 7. Hx of syncope - May 2015.    History of Present Illness:  Angela Blackburn is a [age] her old female with a history of hypertension, hypercholesterolemia and a chest pain. She's had a negative stress test in November 2009. She also has a history of hypothyroidism.  She was diagnosed as having a peripheral neuropathy recently. She's been placed on some medication which seems to be helping some.  She's not eating quite as much exercise as she would like. She's had a little bit of shortness breath with exertion.   Oct. 8, 2013 -  She checks her blood pressure at home periodically. Her readings are on the low side. She has been cutting back on her salt. She's been having some episodes of jaw pain with radiation across her chest and into her shoulders. This typically occurs at night when she's resting. He typically does not occur with exertion. It lasts for about 5 minutes.   June 02, 2012:  She has complained of some chest discomfort when I last saw her in October. A stress Myoview study was normal. She has normal left ventricular function and no ischemia.  She has continued to have some jaw / gum pain which then goes to her chest . It typically occurs at night when she is in bed. Never occurs with exercise. She tries to exercise regularly ( water aerobics and walking)   Nov. 17, 2014:  She has had some chest pains earlier this year. She still has some jaw pain. Myoview study was normal.  She keeps her BP readings - all of her  home readings are normal.   She had a cardioversion last week - was successful. Unfortunately, she has already gone back into A-Fib. She cannot tell whether her rhythm is normal or in AFib.  She does have some leg swelling  May 18, 2013:  Angela Blackburn has not been doing as well. She has some shoulder blade pain - occurs with exertion. Better with rest or tylenol. Not associated with dyspnea or diaphoresis.   She was on Eliquis but she developed a rash. She is now on Xarelto which she tolerates well.   June 03, 2013:  We'll try her on metoprolol at her last visit but she became short of breath. We substituted diltiazem for the metoprolol. She does not think she is any better.   Sept. 8, 2015:  Angela Blackburn is doing well. She brought her blood pressure log from home. All of her blood pressure readings are in the normal range.. She had several episodes of syncope back in May, 2015. She has been restricted from driving until November, [id number]. She's not had any recent episodes of syncope.   Feb. 26, 2016:  Angela Blackburn is a 81 y.o. female who presents for further evaluation of an episode of CP on the right side of her  chest  She went to the ER on Feb. 15.   Work up was negative.  The pain has largely resolved.  The pain was not associated with eating or drinking, nor exercise.  Not  associated with twisting or turning her torso.  Had not done any strenuous exercise.   3 weeks ago, she developed some back pain  She seems to be doing better at this point.   Aug. 29, 2016: Doing ok. Has nausea and shortness of breath that is typically started by a sneezing episodes   Dec. 9, 2016;  Doing lots of better.   Saw Scott since her last visit for orthostatic hypotension .   BP readings at home have been good.  . No CP ,  Occasional DOE .   Past Medical History  Diagnosis Date  . Chest pain     Hx  . Hypothyroidism   . Hyperlipidemia     no meds  . Knee pain   . Peripheral  neuropathy (HCC)   . Fibromyalgia   . PAF (paroxysmal atrial fibrillation) (HCC)     a. 06/2012 Flecainide and Xarelto started;  b. 06/2012 DCCV;  c. 06/2012 Syncope in setting of bradycardia->flecainide and dilt d/c'd->Multaq started.  . Syncope     a. 06/2012 - in setting of bradycardia following dccv->flecainide and dilt d/c'd.  . SVD (spontaneous vaginal delivery)   . COPD (chronic obstructive pulmonary disease) (HCC)     uses inhaler  . Arthritis     knees/hands  . Hypertension     no meds currently - Dr Melburn Popper  . History of nuclear stress test     Myoview 11/16: EF 75%, no ischemia. Low Risk    Past Surgical History  Procedure Laterality Date  . US echocardiography  09/14/2007    EF 55-60%  . Cardiovascular stress test  08/29/2009    EF 86%  . Abdominal hysterectomy    . Joint replacement      Lt. TKA in 2010  . Cardioversion N/A 01/11/2013    Procedure: CARDIOVERSION;  Surgeon: Lewayne Bunting, MD;  Location: [facility] ENDOSCOPY;  Service: Cardiovascular;  Laterality: N/A;  . Cardioversion N/A 06/22/2013    Procedure: CARDIOVERSION;  Surgeon: Vesta Mixer, MD;  Location: [facility] ENDOSCOPY;  Service: Cardiovascular;  Laterality: N/A;  . Colonoscopy    . Anterior and posterior repair N/A 12/21/2013    Procedure: ANTERIOR (CYSTOCELE) AND POSTERIOR REPAIR (RECTOCELE);  Surgeon: Lavina Hamman, MD;  Location: [facility] ORS;  Service: Gynecology;  Laterality: N/A;     Current Outpatient Prescriptions  Medication Sig Dispense Refill  . acetaminophen (TYLENOL) 500 MG tablet Take 1,000 mg by mouth every 6 (six) hours as needed for pain.    [name] albuterol (PROAIR HFA) 108 (90 BASE) MCG/ACT inhaler Inhale 2 puffs into the lungs every 6 (six) hours as needed for shortness of breath.     . calcium carbonate (OS-CAL) 600 MG TABS tablet Take 600 mg by mouth daily with breakfast.    . cephALEXin (KEFLEX) 250 MG capsule Take 250 mg by mouth daily.    . Cholecalciferol (VITAMIN D) 2000 UNITS tablet Take 2,000  Units by mouth daily.     . Cranberry Extract 250 MG TABS Take 250 mg by mouth 3 (three) times daily.    [name] docusate sodium (COLACE) 100 MG capsule Take 200 mg by mouth daily.     . fish oil-omega-3 fatty acids 1000 MG capsule Take 1 g by mouth daily.    [name] KLOR-CON M20 20 MEQ tablet TAKE 1 TABLET BY MOUTH EVERY DAY 30 tablet 11  . levothyroxine (SYNTHROID, LEVOTHROID) 125 MCG tablet Take 125 mcg by mouth daily.      [name]  methylcellulose (ARTIFICIAL TEARS) 1 % ophthalmic solution Place 1 drop into both eyes daily as needed (for dry eyes).     . MULTAQ 400 MG tablet TAKE 1 TABLET (400 MG TOTAL) BY MOUTH 2 (TWO) TIMES DAILY WITH A MEAL. 60 tablet 11  . nortriptyline (PAMELOR) 75 MG capsule Take 75 mg by mouth at bedtime.      . polyethylene glycol (MIRALAX / GLYCOLAX) packet Take 17 g by mouth daily as needed for mild constipation.    . Probiotic Product (ACIDOPHILUS/GOAT MILK) CAPS Take 1 capsule by mouth 2 (two) times daily.    . Rivaroxaban (XARELTO) 15 MG TABS tablet Take 1 tablet (15 mg total) by mouth daily with supper. 30 tablet 11  . tiotropium (SPIRIVA) 18 MCG inhalation capsule Place 18 mcg into inhaler and inhale daily.     No current facility-administered medications for this visit.    Allergies:   Metoprolol; Crestor; Lipitor; Livalo; Other; Zetia; Lisinopril; Penicillins; Statins; and Eliquis    Social History:  The patient  reports that she has never smoked. She has never used smokeless tobacco. She reports that she does not drink alcohol or use illicit drugs.   Family History:  The patient's family history includes Heart failure in her mother.    ROS:  Please see the history of present illness.    Review of Systems: Constitutional:  denies fever, chills, diaphoresis, appetite change and fatigue.  HEENT: denies photophobia, eye pain, redness, hearing loss, ear pain, congestion, sore throat, rhinorrhea, sneezing, neck pain, neck stiffness and tinnitus.  Respiratory: denies SOB,  DOE, cough, chest tightness, and wheezing.  Cardiovascular: denies chest pain, palpitations and leg swelling.  Gastrointestinal: denies nausea, vomiting, abdominal pain, diarrhea, constipation, blood in stool.  Genitourinary: denies dysuria, urgency, frequency, hematuria, flank pain and difficulty urinating.  Musculoskeletal: denies  myalgias, back pain, joint swelling, arthralgias and gait problem.   Skin: denies pallor, rash and wound.  Neurological: denies dizziness, seizures, syncope, weakness, light-headedness, numbness and headaches.   Hematological: denies adenopathy, easy bruising, personal or family bleeding history.  Psychiatric/ Behavioral: denies suicidal ideation, mood changes, confusion, nervousness, sleep disturbance and agitation.       All other systems are reviewed and negative.    PHYSICAL EXAM: VS:  BP 190/96 mmHg  Pulse 63  Ht  (1.651 m)  Wt 160 lb (72.576 kg)  BMI 26.63 kg/m2  SpO2 98% , BMI Body mass index is 26.63 kg/(m^2). GEN: Well nourished, well developed, in no acute distress HEENT: normal Neck: no JVD, carotid bruits, or masses Cardiac: RR;   Soft systolic  murmur, rubs, or gallops,no edema ,  Occasional premature beats Respiratory:  clear to auscultation bilaterally, normal work of breathing GI: soft, nontender, nondistended, + BS MS: no deformity or atrophy Skin: warm and dry, no rash Neuro:  Strength and sensation are intact Psych: normal   EKG:  EKG is not ordered today.    Recent Labs: 12/04/2014: ALT 26; B Natriuretic Peptide 120.7*; BUN 16; Creatinine, Ser 1.17*; Hemoglobin 13.9; Platelets 222; Potassium 4.0; Sodium 136    Lipid Panel    Component Value Date/Time   CHOL 170 10/30/2014 0918   TRIG 87.0 10/30/2014 0918   HDL 56.80 10/30/2014 0918   CHOLHDL 3 10/30/2014 0918   VLDL 17.4 10/30/2014 0918   LDLCALC 96 10/30/2014 0918   LDLDIRECT 121.6 10/01/2011 0926      Wt Readings from Last 3 Encounters:  02/09/15 160 lb  (72.576 kg)  01/08/15  157 lb (71.215 kg)  01/03/15 151 lb (68.493 kg)      Other studies Reviewed: Additional studies/ records that were reviewed today include: ER visit.. Review of the above records demonstrates: negative work up .   ASSESSMENT AND PLAN:  1. Hypertension - blood pressures quite variable. I suspect that she is eating salt on intermittent basis. She brought her blood pressure log with her. Many of her blood pressure readings are fairly low. I've encouraged her to stick to a better diet. I do not think that she needs additional medications.  2. History chest pains-negative stress Myoview study in November, 2009, 2011, 2013.  Her chest pain sounds noncardiac. I do not think that she needs another Myoview study at this time.  3. Hypothyroidism  4. Hypercholesterolemia -   Labs checked today   5. Peripheral neuropathy 6. Atrial fibrillation-currently on his Xarelto.    Has had cardioversion last year .  Maintaining  NSR , on Multaq  7. Hx of syncope - May 2015.  8. Dyspnea:   Recent echo shows:  Left ventricle: The cavity size was normal. Wall thickness was normal. Systolic function was normal. The estimated ejection fraction was in the range of 55% to 60%. Wall motion was normal; there were no regional wall motion abnormalities. Left ventricular diastolic function parameters were normal. - Mitral valve: Calcified annulus. Mildly thickened leaflets . There was mild regurgitation. - Tricuspid valve: There was moderate regurgitation. - Pulmonary arteries: Systolic pressure was mildly increased. PA peak pressure: 42 mm Hg (S   Current medicines are reviewed at length with the patient today.  The patient does not have concerns regarding medicines.  The following changes have been made:  no change   Disposition:   FU with me in 6 months .     Signed, Nahser, [name], MD  02/09/2015 11:53 AM    [facility] [address]  [place], [place], [place]  [address] Phone: [phone]; Fax: [phone]

## 2015-02-15 ENCOUNTER — Ambulatory Visit
Admission: RE | Admit: 2015-02-15 | Discharge: 2015-02-15 | Disposition: A | Discharge status: Home / Self Care | Payer: Medicare Other | Source: Ambulatory Visit

## 2015-02-15 DIAGNOSIS — Z1231 Encounter for screening mammogram for malignant neoplasm of breast: Secondary | ICD-10-CM

## 2015-07-13 ENCOUNTER — Telehealth: Payer: Self-pay | Admitting: Cardiovascular Disease

## 2015-07-13 NOTE — Telephone Encounter (Signed)
The Multaq seems to be keeping her in NSR and I would recommend that we continue it .

## 2015-07-13 NOTE — Telephone Encounter (Signed)
New message     Dr. Levada DyBadderr office is returning a phone call regarding the pt.,about the medications that the pt is currently on, please call Evony back, she states she really needs to speak to a nurse. She did not give any other details.

## 2015-07-13 NOTE — Telephone Encounter (Signed)
Multaq and nortiptyline are contraindicated due to risk of QTc prolongation and torsades.  Her last EKG showed normal rhythm with a QTc of 415 msec.  The only other antiarrhythmic she has tried was flecainide and it was stopped due to bradycardia.  She would not be able to change to a different antiarrhythmic as they all have the same interaction with tricyclic antidepressants.  Best options to not interfere with antiarrhythmics are SSRI or SNRI.  May suggest trying SNRI (venlafaxine or Cymbalta) next to avoid drug-drug interaction.  Risk of stopping Multaq would be pt going back into atrial fibrillation.  Per Dr. Harvie BridgeNahser's note, when she was in afib in the past (2014) she was fairly asymptomatic.  Will defer to him whether she should continue Multaq but if she does not an antiarrhythmic suggest trying SNRI next.

## 2015-07-13 NOTE — Telephone Encounter (Signed)
I contacted Dr Fortunato CurlingBadger's office and will fax this note to [phone].

## 2015-07-13 NOTE — Telephone Encounter (Signed)
I spoke with Angela Blackburn at Dr Fortunato CurlingBadger's office and they contacted our office in regards to medication interaction between nortriptyline and multaq. The pharmacy contacted Dr Fortunato CurlingBadger's office about 6-8 weeks ago with this interaction and at that time the pt's nortriptyline was stopped.  The pt continued to have issues with depression, anxiety and emotional issues so they have tried Lexapro, Prozac and Restoril which has not helped the pt's symptoms.  Dr Cyndia BentBadger would like to know if Multaq can be stopped or substituted so that the pt can go back on nortriptyline. If Multaq cannot be substituted what risks are involved with the pt taking these two medications together. I will initially forward this message to clinical pharmacist to review so that further information can be obtained for Dr Elease HashimotoNahser to review.

## 2015-07-21 ENCOUNTER — Emergency Department (HOSPITAL_COMMUNITY)
Admission: EM | Admit: 2015-07-21 | Discharge: 2015-07-21 | Disposition: A | Discharge status: Home / Self Care | Payer: Medicare Other | Attending: Emergency Medicine | Admitting: Emergency Medicine

## 2015-07-21 ENCOUNTER — Encounter (HOSPITAL_COMMUNITY): Payer: Self-pay | Admitting: *Deleted

## 2015-07-21 DIAGNOSIS — E785 Hyperlipidemia, unspecified: Secondary | ICD-10-CM | POA: Insufficient documentation

## 2015-07-21 DIAGNOSIS — E039 Hypothyroidism, unspecified: Secondary | ICD-10-CM | POA: Diagnosis not present

## 2015-07-21 DIAGNOSIS — I10 Essential (primary) hypertension: Secondary | ICD-10-CM | POA: Diagnosis not present

## 2015-07-21 DIAGNOSIS — J449 Chronic obstructive pulmonary disease, unspecified: Secondary | ICD-10-CM | POA: Insufficient documentation

## 2015-07-21 DIAGNOSIS — M13869 Other specified arthritis, unspecified knee: Secondary | ICD-10-CM | POA: Insufficient documentation

## 2015-07-21 DIAGNOSIS — Z9660 Presence of unspecified orthopedic joint implant: Secondary | ICD-10-CM | POA: Diagnosis not present

## 2015-07-21 DIAGNOSIS — F32A Depression, unspecified: Secondary | ICD-10-CM

## 2015-07-21 DIAGNOSIS — Z79899 Other long term (current) drug therapy: Secondary | ICD-10-CM | POA: Diagnosis not present

## 2015-07-21 DIAGNOSIS — F329 Major depressive disorder, single episode, unspecified: Secondary | ICD-10-CM

## 2015-07-21 DIAGNOSIS — F418 Other specified anxiety disorders: Secondary | ICD-10-CM | POA: Insufficient documentation

## 2015-07-21 DIAGNOSIS — G629 Polyneuropathy, unspecified: Secondary | ICD-10-CM | POA: Diagnosis not present

## 2015-07-21 DIAGNOSIS — Z7901 Long term (current) use of anticoagulants: Secondary | ICD-10-CM | POA: Diagnosis not present

## 2015-07-21 DIAGNOSIS — M13849 Other specified arthritis, unspecified hand: Secondary | ICD-10-CM | POA: Insufficient documentation

## 2015-07-21 HISTORY — DX: Urinary tract infection, site not specified: N39.0

## 2015-07-21 NOTE — Discharge Instructions (Signed)
You were seen and evaluated today for a your depression. You can try taking a second dose of your Xanax around dinner time to see if this helps with her symptoms. Do not take at the same time as her sleeping medicine and make sure that they are at least 2 to 3 hours apart. Make follow-up appointments with your primary care physician as well as with the resources provided by behavioral health. If you feel you cannot tolerate the symptoms at home and would like to explore the option of inpatient treatment please return to the emergency department.  Major Depressive Disorder Major depressive disorder is a mental illness. It also may be called clinical depression or unipolar depression. Major depressive disorder usually causes feelings of sadness, hopelessness, or helplessness. Some people with this disorder do not feel particularly sad but lose interest in doing things they used to enjoy (anhedonia). Major depressive disorder also can cause physical symptoms. It can interfere with work, school, relationships, and other normal everyday activities. The disorder varies in severity but is longer lasting and more serious than the sadness we all feel from time to time in our lives. Major depressive disorder often is triggered by stressful life events or major life changes. Examples of these triggers include divorce, loss of your job or home, a move, and the death of a family member or close friend. Sometimes this disorder occurs for no obvious reason at all. People who have family members with major depressive disorder or bipolar disorder are at higher risk for developing this disorder, with or without life stressors. Major depressive disorder can occur at any age. It may occur just once in your life (single episode major depressive disorder). It may occur multiple times (recurrent major depressive disorder). SYMPTOMS People with major depressive disorder have either anhedonia or depressed mood on nearly a daily basis  for at least 2 weeks or longer. Symptoms of depressed mood include:  Feelings of sadness (blue or down in the dumps) or emptiness.  Feelings of hopelessness or helplessness.  Tearfulness or episodes of crying (may be observed by others).  Irritability (children and adolescents). In addition to depressed mood or anhedonia or both, people with this disorder have at least four of the following symptoms:  Difficulty sleeping or sleeping too much.   Significant change (increase or decrease) in appetite or weight.   Lack of energy or motivation.  Feelings of guilt and worthlessness.   Difficulty concentrating, remembering, or making decisions.  Unusually slow movement (psychomotor retardation) or restlessness (as observed by others).   Recurrent wishes for death, recurrent thoughts of self-harm (suicide), or a suicide attempt. People with major depressive disorder commonly have persistent negative thoughts about themselves, other people, and the world. People with severe major depressive disorder may experiencedistorted beliefs or perceptions about the world (psychotic delusions). They also may see or hear things that are not real (psychotic hallucinations). DIAGNOSIS Major depressive disorder is diagnosed through an assessment by your health care provider. Your health care provider will ask aboutaspects of your daily life, such as mood,sleep, and appetite, to see if you have the diagnostic symptoms of major depressive disorder. Your health care provider may ask about your medical history and use of alcohol or drugs, including prescription medicines. Your health care provider also may do a physical exam and blood work. This is because certain medical conditions and the use of certain substances can cause major depressive disorder-like symptoms (secondary depression). Your health care provider also may refer you to  a mental health specialist for further evaluation and  treatment. TREATMENT It is important to recognize the symptoms of major depressive disorder and seek treatment. The following treatments can be prescribed for this disorder:   Medicine. Antidepressant medicines usually are prescribed. Antidepressant medicines are thought to correct chemical imbalances in the brain that are commonly associated with major depressive disorder. Other types of medicine may be added if the symptoms do not respond to antidepressant medicines alone or if psychotic delusions or hallucinations occur.  Talk therapy. Talk therapy can be helpful in treating major depressive disorder by providing support, education, and guidance. Certain types of talk therapy also can help with negative thinking (cognitive behavioral therapy) and with relationship issues that trigger this disorder (interpersonal therapy). A mental health specialist can help determine which treatment is best for you. Most people with major depressive disorder do well with a combination of medicine and talk therapy. Treatments involving electrical stimulation of the brain can be used in situations with extremely severe symptoms or when medicine and talk therapy do not work over time. These treatments include electroconvulsive therapy, transcranial magnetic stimulation, and vagal nerve stimulation.   This information is not intended to replace advice given to you by your health care provider. Make sure you discuss any questions you have with your health care provider.   Document Released: 06/14/2012 Document Revised: 03/10/2014 Document Reviewed: 06/14/2012 Elsevier Interactive Patient Education [employer]

## 2015-07-21 NOTE — ED Provider Notes (Signed)
CSN: [id number]     Arrival date & time 07/21/15  4010 History   First MD Initiated Contact with Patient 07/21/15 [phone]     Chief Complaint  Patient presents with  . Depression     (Consider location/radiation/quality/duration/timing/severity/associated sxs/prior Treatment) HPI Comments: 81 year old female with history of COPD, hypertension, lipidemia, atrial fibrillation, depression presents for uncontrolled depression. About 2-3 months ago the patient was taken off all of her psychiatric medications because it was interfering with her heart medications. Since that time she has had increasing issues with anxiety and depression. She says that she can make any decisions because she doubts herself all the time. She has no suicidal or homicidal thoughts. She has never thought of hurting herself. She otherwise has been in her normal health but just cannot enjoy things. She feels somewhat better after taking her Xanax in the morning but then the next morning will feel extremely depressed and down until she takes at again. She was started on Lexapro 3 weeks ago for these symptoms and wall they have improved there still interfering with her daily life. She has no other complaints. Her daughter and her husband are accompanying her today. She does not want inpatient help at this time and her family does not feel that she needs inpatient treatment at this time.   Past Medical History  Diagnosis Date  . Chest pain     Hx  . Hypothyroidism   . Hyperlipidemia     no meds  . Knee pain   . Peripheral neuropathy (HCC)   . Fibromyalgia   . PAF (paroxysmal atrial fibrillation) (HCC)     a. 06/2012 Flecainide and Xarelto started;  b. 06/2012 DCCV;  c. 06/2012 Syncope in setting of bradycardia->flecainide and dilt d/c'd->Multaq started.  . Syncope     a. 06/2012 - in setting of bradycardia following dccv->flecainide and dilt d/c'd.  . SVD (spontaneous vaginal delivery)   . COPD (chronic obstructive pulmonary  disease) (HCC)     uses inhaler  . Arthritis     knees/hands  . Hypertension     no meds currently - Dr Melburn Popper  . History of nuclear stress test     Myoview 11/16: EF 75%, no ischemia. Low Risk  . UTI (lower urinary tract infection)    Past Surgical History  Procedure Laterality Date  . US echocardiography  09/14/2007    EF 55-60%  . Cardiovascular stress test  08/29/2009    EF 86%  . Abdominal hysterectomy    . Joint replacement      Lt. TKA in 2010  . Cardioversion N/A 01/11/2013    Procedure: CARDIOVERSION;  Surgeon: Lewayne Bunting, MD;  Location: [facility] ENDOSCOPY;  Service: Cardiovascular;  Laterality: N/A;  . Cardioversion N/A 06/22/2013    Procedure: CARDIOVERSION;  Surgeon: Vesta Mixer, MD;  Location: [facility] ENDOSCOPY;  Service: Cardiovascular;  Laterality: N/A;  . Colonoscopy    . Anterior and posterior repair N/A 12/21/2013    Procedure: ANTERIOR (CYSTOCELE) AND POSTERIOR REPAIR (RECTOCELE);  Surgeon: Lavina Hamman, MD;  Location: [facility] ORS;  Service: Gynecology;  Laterality: N/A;   Family History  Problem Relation Age of Onset  . Heart failure Mother    Social History  Substance Use Topics  . Smoking status: Never Smoker   . Smokeless tobacco: Never Used  . Alcohol Use: No   OB History    No data available     Review of Systems  Constitutional: Negative for fever, chills and fatigue.  HENT: Negative for congestion, rhinorrhea and sinus pressure.   Eyes: Negative for visual disturbance.  Respiratory: Negative for cough, chest tightness and shortness of breath.   Cardiovascular: Negative for chest pain and palpitations.  Gastrointestinal: Negative for nausea, vomiting, abdominal pain and diarrhea.  Genitourinary: Negative for dysuria, urgency and hematuria.  Musculoskeletal: Negative for myalgias and back pain.  Skin: Negative for rash.  Neurological: Negative for dizziness, weakness and light-headedness.  Hematological: Bruises/bleeds easily.   Psychiatric/Behavioral: Positive for dysphoric mood. Negative for suicidal ideas, behavioral problems, self-injury and agitation. The patient is nervous/anxious.       Allergies  Metoprolol; Crestor; Lipitor; Livalo; Other; Zetia; Lisinopril; Penicillins; Statins; and Eliquis  Home Medications   Prior to Admission medications   Medication Sig Start Date End Date Taking? Authorizing Provider  acetaminophen (TYLENOL) 500 MG tablet Take 1,000 mg by mouth every 6 (six) hours as needed for pain.   Yes Historical Provider, MD  albuterol (PROAIR HFA) 108 (90 BASE) MCG/ACT inhaler Inhale 2 puffs into the lungs every 6 (six) hours as needed for shortness of breath.    Yes Historical Provider, MD  ALPRAZolam (XANAX) 0.25 MG tablet Take 0.25 mg by mouth 2 (two) times daily as needed for anxiety.   Yes Historical Provider, MD  calcium carbonate (OS-CAL) 600 MG TABS tablet Take 600 mg by mouth daily with breakfast.   Yes Historical Provider, MD  cephALEXin (KEFLEX) 250 MG capsule Take 250 mg by mouth daily. 09/14/14  Yes Historical Provider, MD  Cholecalciferol (VITAMIN D) 2000 UNITS tablet Take 2,000 Units by mouth daily.    Yes Historical Provider, MD  Cranberry Extract 250 MG TABS Take 250 mg by mouth 3 (three) times daily.   Yes Historical Provider, MD  docusate sodium (COLACE) 100 MG capsule Take 200 mg by mouth daily.    Yes Historical Provider, MD  escitalopram (LEXAPRO) 10 MG tablet Take 10 mg by mouth daily.   Yes Historical Provider, MD  fish oil-omega-3 fatty acids 1000 MG capsule Take 1 g by mouth daily.   Yes Historical Provider, MD  KLOR-CON M20 20 MEQ tablet TAKE 1 TABLET BY MOUTH EVERY DAY 01/18/15  Yes Vesta MixerPhilip J Nahser, MD  levothyroxine (SYNTHROID, LEVOTHROID) 137 MCG tablet Take 137 mcg by mouth daily before breakfast.   Yes Historical Provider, MD  losartan (COZAAR) 25 MG tablet Take 25 mg by mouth daily.   Yes Historical Provider, MD  methylcellulose (ARTIFICIAL TEARS) 1 % ophthalmic  solution Place 1 drop into both eyes daily as needed (for dry eyes).    Yes Historical Provider, MD  MULTAQ 400 MG tablet TAKE 1 TABLET (400 MG TOTAL) BY MOUTH 2 (TWO) TIMES DAILY WITH A MEAL. 02/07/15  Yes Vesta MixerPhilip J Nahser, MD  nystatin (MYCOSTATIN) 100000 UNIT/ML suspension Take 5 mLs by mouth 4 (four) times daily as needed. For thrush. 02/27/15  Yes Historical Provider, MD  polyethylene glycol (MIRALAX / GLYCOLAX) packet Take 17 g by mouth daily as needed for mild constipation.   Yes Historical Provider, MD  Probiotic Product (ACIDOPHILUS/GOAT MILK) CAPS Take 1 capsule by mouth 2 (two) times daily.   Yes Historical Provider, MD  Rivaroxaban (XARELTO) 15 MG TABS tablet Take 1 tablet (15 mg total) by mouth daily with supper. 12/22/14  Yes Scott T Alben SpittleWeaver, PA-C  temazepam (RESTORIL) 15 MG capsule Take 15 mg by mouth at bedtime as needed for sleep.   Yes Historical Provider, MD  tiotropium (SPIRIVA) 18 MCG inhalation capsule Place 18 mcg  into inhaler and inhale daily.   Yes Historical Provider, MD   BP 153/85 mmHg  Pulse 69  Temp(Src) 97.7 F (36.5 C) (Oral)  Resp 16  Ht  (1.651 m)  Wt 155 lb (70.308 kg)  BMI 25.79 kg/m2  SpO2 97% Physical Exam  Constitutional: She is oriented to person, place, and time. She appears well-developed and well-nourished. No distress.  HENT:  Head: Normocephalic and atraumatic.  Right Ear: External ear normal.  Left Ear: External ear normal.  Nose: Nose normal.  Mouth/Throat: Oropharynx is clear and moist. No oropharyngeal exudate.  Eyes: EOM are normal. Pupils are equal, round, and reactive to light.  Neck: Normal range of motion. Neck supple.  Cardiovascular: Regular rhythm and intact distal pulses.   Pulmonary/Chest: Effort normal. No respiratory distress.  Abdominal: Soft. She exhibits no distension.  Musculoskeletal: Normal range of motion. She exhibits no edema or tenderness.  Neurological: She is alert and oriented to person, place, and time.   Skin: Skin is warm and dry. No rash noted. She is not diaphoretic.  Psychiatric: Her speech is normal. Her mood appears anxious. Her affect is blunt. She is slowed. She does not express impulsivity or inappropriate judgment. She exhibits a depressed mood. She expresses no homicidal and no suicidal ideation. She expresses no suicidal plans and no homicidal plans.  Vitals reviewed.   ED Course  Procedures (including critical care time) Labs Review Labs Reviewed - No data to display  Imaging Review No results found. I have personally reviewed and evaluated these images and lab results as part of my medical decision-making.   EKG Interpretation None      MDM  Patient was seen and evaluated in stable condition with family at the bedside. Behavioral Health gave the patient resources for outpatient geriatric psych resources. I had a lengthy discussion with the patient and her family. They're going to follow up with resources outpatient and also follow up with her primary care physician for any changes in medications. I did say she could try taking a second dose of her Xanax later in the day if she feels like this is what is helping her most. Right now she is on a very low dose of 0.25 mg once in the morning. They expressed understanding and agreement with this plan. Patient otherwise had a normal examination and was well-appearing. No suicidal or homicidal thoughts. Patient was discharged home in stable condition with strict return precautions. Final diagnoses:  None    1. Depression    Leta Baptist, MD 07/21/15 1128

## 2015-07-21 NOTE — ED Notes (Signed)
Pt states she is in " state of depression" has been treated for years for depression, they took her off antidepressants due to heart problems, 3 weeks ago started on Lexapro.A little improvement, husbands states it is worse in the mornings. No SI/HI.

## 2015-08-07 ENCOUNTER — Emergency Department (HOSPITAL_COMMUNITY)
Admission: EM | Admit: 2015-08-07 | Discharge: 2015-08-07 | Disposition: A | Discharge status: Home / Self Care | Payer: Medicare Other | Attending: Emergency Medicine | Admitting: Emergency Medicine

## 2015-08-07 ENCOUNTER — Emergency Department (HOSPITAL_COMMUNITY): Payer: Medicare Other

## 2015-08-07 ENCOUNTER — Encounter (HOSPITAL_COMMUNITY): Payer: Self-pay

## 2015-08-07 DIAGNOSIS — R531 Weakness: Secondary | ICD-10-CM | POA: Insufficient documentation

## 2015-08-07 DIAGNOSIS — R2689 Other abnormalities of gait and mobility: Secondary | ICD-10-CM | POA: Insufficient documentation

## 2015-08-07 DIAGNOSIS — Z7901 Long term (current) use of anticoagulants: Secondary | ICD-10-CM | POA: Diagnosis not present

## 2015-08-07 DIAGNOSIS — I10 Essential (primary) hypertension: Secondary | ICD-10-CM | POA: Insufficient documentation

## 2015-08-07 DIAGNOSIS — Z96652 Presence of left artificial knee joint: Secondary | ICD-10-CM | POA: Diagnosis not present

## 2015-08-07 DIAGNOSIS — J449 Chronic obstructive pulmonary disease, unspecified: Secondary | ICD-10-CM | POA: Insufficient documentation

## 2015-08-07 DIAGNOSIS — E785 Hyperlipidemia, unspecified: Secondary | ICD-10-CM | POA: Diagnosis not present

## 2015-08-07 DIAGNOSIS — R262 Difficulty in walking, not elsewhere classified: Secondary | ICD-10-CM

## 2015-08-07 LAB — CBC
HEMATOCRIT: 42.7 % (ref 36.0–46.0)
HEMOGLOBIN: 13.8 g/dL (ref 12.0–15.0)
MCH: 29.2 pg (ref 26.0–34.0)
MCHC: 32.3 g/dL (ref 30.0–36.0)
MCV: 90.3 fL (ref 78.0–100.0)
Platelets: 263 10*3/uL (ref 150–400)
RBC: 4.73 MIL/uL (ref 3.87–5.11)
RDW: 13.3 % (ref 11.5–15.5)
WBC: 6 10*3/uL (ref 4.0–10.5)

## 2015-08-07 LAB — BASIC METABOLIC PANEL
ANION GAP: 10 (ref 5–15)
BUN: 16 mg/dL (ref 6–20)
CHLORIDE: 100 mmol/L — AB (ref 101–111)
CO2: 26 mmol/L (ref 22–32)
Calcium: 9.7 mg/dL (ref 8.9–10.3)
Creatinine, Ser: 1.06 mg/dL — ABNORMAL HIGH (ref 0.44–1.00)
GFR calc non Af Amer: 48 mL/min — ABNORMAL LOW (ref >60)
GFR, EST AFRICAN AMERICAN: 56 mL/min — AB (ref >60)
Glucose, Bld: 94 mg/dL (ref 65–99)
Potassium: 3.9 mmol/L (ref 3.5–5.1)
Sodium: 136 mmol/L (ref 135–145)

## 2015-08-07 LAB — URINALYSIS, ROUTINE W REFLEX MICROSCOPIC
BILIRUBIN URINE: NEGATIVE
Glucose, UA: NEGATIVE mg/dL
HGB URINE DIPSTICK: NEGATIVE
KETONES UR: NEGATIVE mg/dL
Leukocytes, UA: NEGATIVE
NITRITE: NEGATIVE
PH: 7.5 (ref 5.0–8.0)
Protein, ur: NEGATIVE mg/dL
SPECIFIC GRAVITY, URINE: 1.015 (ref 1.005–1.030)

## 2015-08-07 NOTE — ED Provider Notes (Signed)
CSN: [id number]     Arrival date & time 08/07/15  0905 History   First MD Initiated Contact with Patient 08/07/15 1026     Chief Complaint  Patient presents with  . Weakness     (Consider location/radiation/quality/duration/timing/severity/associated sxs/prior Treatment) HPI   Angela Blackburn is a(n) 82 y.o. female who presents with weakness.  She has a pmh of PAFIB, fibromyalgia, utis. She is bib her husband and daughter. She has a hx of dipressiona dna anxiety. She was taken off of her nortryptiline several weaks ago b/c it interacted with one of her medicaitons for AFIB. Her daughter states that since that time her depression and ansiety have been progressively worsening, with anhedonia, sleeplessness, loss of appetite and weightloss, difficulty making decisions, and anxiety.  She was unable to tolerate zoloft, and has been on lexapro for the past 6 weeks with a dose increase last week. There chief concern however is a new generalized weakness over the past 3 weeks. She has had intermittent difficulty ambulating and at times seems to walk well. The daughter states that at times she is unable to even push himself up out of a chair to stand. They have noticed increased tremor, pill-rolling and shuffling gait. She is on a blood thinner but denies any melena or hematochezia. Denies fevers, chills, myalgias, arthralgias. Denies DOE, SOB, chest tightness or pressure, radiation to left arm, jaw or back, or diaphoresis. Denies dysuria, flank pain, suprapubic pain, frequency, urgency, or hematuria. Denies headaches, light headedness, visual disturbances. Denies abdominal pain, nausea, vomiting, diarrhea or constipation.   Past Medical History  Diagnosis Date  . Chest pain     Hx  . Hypothyroidism   . Hyperlipidemia     no meds  . Knee pain   . Peripheral neuropathy (HCC)   . Fibromyalgia   . PAF (paroxysmal atrial fibrillation) (HCC)     a. 06/2012 Flecainide and Xarelto started;  b. 06/2012  DCCV;  c. 06/2012 Syncope in setting of bradycardia->flecainide and dilt d/c'd->Multaq started.  . Syncope     a. 06/2012 - in setting of bradycardia following dccv->flecainide and dilt d/c'd.  . SVD (spontaneous vaginal delivery)   . COPD (chronic obstructive pulmonary disease) (HCC)     uses inhaler  . Arthritis     knees/hands  . Hypertension     no meds currently - Dr Melburn Popper  . History of nuclear stress test     Myoview 11/16: EF 75%, no ischemia. Low Risk  . UTI (lower urinary tract infection)    Past Surgical History  Procedure Laterality Date  . US echocardiography  09/14/2007    EF 55-60%  . Cardiovascular stress test  08/29/2009    EF 86%  . Abdominal hysterectomy    . Joint replacement      Lt. TKA in 2010  . Cardioversion N/A 01/11/2013    Procedure: CARDIOVERSION;  Surgeon: Lewayne Bunting, MD;  Location: [facility] ENDOSCOPY;  Service: Cardiovascular;  Laterality: N/A;  . Cardioversion N/A 06/22/2013    Procedure: CARDIOVERSION;  Surgeon: Vesta Mixer, MD;  Location: [facility] ENDOSCOPY;  Service: Cardiovascular;  Laterality: N/A;  . Colonoscopy    . Anterior and posterior repair N/A 12/21/2013    Procedure: ANTERIOR (CYSTOCELE) AND POSTERIOR REPAIR (RECTOCELE);  Surgeon: Lavina Hamman, MD;  Location: [facility] ORS;  Service: Gynecology;  Laterality: N/A;   Family History  Problem Relation Age of Onset  . Heart failure Mother    Social History  Substance Use Topics  .  Smoking status: Never Smoker   . Smokeless tobacco: Never Used  . Alcohol Use: No   OB History    No data available     Review of Systems  Ten systems reviewed and are negative for acute change, except as noted in the HPI.    Allergies  Metoprolol; Crestor; Lipitor; Livalo; Other; Zetia; Lisinopril; Penicillins; Statins; and Eliquis  Home Medications   Prior to Admission medications   Medication Sig Start Date End Date Taking? Authorizing Provider  acetaminophen (TYLENOL) 500 MG tablet Take 1,000 mg by  mouth every 6 (six) hours as needed for pain.   Yes Historical Provider, MD  albuterol (PROAIR HFA) 108 (90 BASE) MCG/ACT inhaler Inhale 2 puffs into the lungs every 6 (six) hours as needed for shortness of breath.    Yes Historical Provider, MD  ALPRAZolam (XANAX) 0.25 MG tablet Take 0.25 mg by mouth 2 (two) times daily.    Yes Historical Provider, MD  calcium carbonate (OS-CAL) 600 MG TABS tablet Take 600 mg by mouth daily with breakfast.   Yes Historical Provider, MD  cephALEXin (KEFLEX) 250 MG capsule Take 250 mg by mouth daily. 09/14/14  Yes Historical Provider, MD  Cholecalciferol (VITAMIN D) 2000 UNITS tablet Take 2,000 Units by mouth daily.    Yes Historical Provider, MD  Cranberry Extract 250 MG TABS Take 250 mg by mouth 3 (three) times daily.   Yes Historical Provider, MD  docusate sodium (COLACE) 100 MG capsule Take 200 mg by mouth daily.    Yes Historical Provider, MD  escitalopram (LEXAPRO) 20 MG tablet Take 20 mg by mouth daily. Pt gives at 630pm   Yes Historical Provider, MD  fish oil-omega-3 fatty acids 1000 MG capsule Take 1 g by mouth daily.   Yes Historical Provider, MD  KLOR-CON M20 20 MEQ tablet TAKE 1 TABLET BY MOUTH EVERY DAY 01/18/15  Yes Vesta MixerPhilip J Nahser, MD  levothyroxine (SYNTHROID, LEVOTHROID) 137 MCG tablet Take 137 mcg by mouth every evening.    Yes Historical Provider, MD  losartan (COZAAR) 25 MG tablet Take 25 mg by mouth daily.   Yes Historical Provider, MD  methylcellulose (ARTIFICIAL TEARS) 1 % ophthalmic solution Place 1 drop into both eyes daily as needed (for dry eyes).    Yes Historical Provider, MD  MULTAQ 400 MG tablet TAKE 1 TABLET (400 MG TOTAL) BY MOUTH 2 (TWO) TIMES DAILY WITH A MEAL. 02/07/15  Yes Vesta MixerPhilip J Nahser, MD  nystatin (MYCOSTATIN) 100000 UNIT/ML suspension Take 5 mLs by mouth 4 (four) times daily as needed. Reported on 08/08/2015 02/27/15  Yes Historical Provider, MD  polyethylene glycol (MIRALAX / GLYCOLAX) packet Take 17 g by mouth daily as needed  for mild constipation.   Yes Historical Provider, MD  Probiotic Product (ACIDOPHILUS/GOAT MILK) CAPS Take 1 capsule by mouth 2 (two) times daily.   Yes Historical Provider, MD  Rivaroxaban (XARELTO) 15 MG TABS tablet Take 1 tablet (15 mg total) by mouth daily with supper. 12/22/14  Yes Scott T Alben SpittleWeaver, PA-C  temazepam (RESTORIL) 15 MG capsule Take 15 mg by mouth at bedtime as needed for sleep.   Yes Historical Provider, MD  tiotropium (SPIRIVA) 18 MCG inhalation capsule Place 18 mcg into inhaler and inhale daily.   Yes Historical Provider, MD   BP 182/90 mmHg  Pulse 62  Temp(Src) 97.8 F (36.6 C) (Oral)  Resp 19  SpO2 98% Physical Exam  Constitutional: She is oriented to person, place, and time. She appears well-developed and well-nourished.  No distress.  HENT:  Head: Normocephalic and atraumatic.  Eyes: Conjunctivae are normal. No scleral icterus.  Neck: Normal range of motion.  Cardiovascular: Normal rate, regular rhythm and normal heart sounds.  Exam reveals no gallop and no friction rub.   No murmur heard. Pulmonary/Chest: Effort normal and breath sounds normal. No respiratory distress.  Abdominal: Soft. Bowel sounds are normal. She exhibits no distension and no mass. There is no tenderness. There is no guarding.  Neurological: She is alert and oriented to person, place, and time.  Mask facies Speech is clear and goal oriented, follows commands Major Cranial nerves without deficit, no facial droop Normal strength in upper and lower extremities bilaterally including dorsiflexion and plantar flexion, strong and equal grip strength Sensation normal to light and sharp touch Moves extremities without ataxia, coordination intact Finger to nose is WNL HOWEVER SHE HAS A TREMOR AND PILL ROLLING AT REST No pronator drift GAIT DEFFERED   Skin: Skin is warm and dry. She is not diaphoretic.  Nursing note and vitals reviewed.   ED Course  Procedures (including critical care time) Labs  Review Labs Reviewed  BASIC METABOLIC PANEL - Abnormal; Notable for the following:    Chloride 100 (*)    Creatinine, Ser 1.06 (*)    GFR calc non Af Amer 48 (*)    GFR calc Af Amer 56 (*)    All other components within normal limits  CBC  URINALYSIS, ROUTINE W REFLEX MICROSCOPIC (NOT AT [facility])    Imaging Review No results found. I have personally reviewed and evaluated these images and lab results as part of my medical decision-making.   EKG Interpretation   Date/Time:  Tuesday August 07 2015 09:29:47 EDT Ventricular Rate:  78 PR Interval:    QRS Duration: 80 QT Interval:  400 QTC Calculation: 456 R Axis:   37 Text Interpretation:  Atrial flutter new since last tracing Confirmed by  Erroll Luna [id number]) on 08/08/2015 2:36:10 PM    * MDM   Final diagnoses:  Generalized weakness  Ambulatory dysfunction    11:27 AM BP 166/87 mmHg  Pulse 69  Temp(Src) 97.8 F (36.6 C) (Oral)  Resp 20  SpO2 96% This is an 82 year old female with generalized weakness, depression. She currently has rate controlled atrial fibrillation. No unilateral or appreciable acute neurologic deficit, however, have concern for parkinsonism. I spoken with Dr. Patria Mane. We will get a head CT.   3:10 PM pATIENT WITH htn, work up is negative.  I have discussed the case with Dr. Patria Mane. I have a strong concern for Parkinson's being the underlying cause of her worsening depression and weakness. She has many of the clinical signs of Parkinson's including pill rolling, tremor, mask facies, and shuffling gate.  I have discussed the si/sx/and workup with the PA at Dr. Fortunato Curling office. The patient has an appointment in 2 days for follow up.  I have also secured an appointment with Dr. Ellan Lambert for neurologic work up.  Patient will also have PT/OT/Nurse  Aid at home to help with strenght, ambulation, fall prevention. The family understands today's work up and plan of care.  The patient appears safe for  discharge. All medication changes should be managed by prescribing physician.  I have discussed lab and/or imaging findings with the patient and answered all questions/concerns to the best of my ability. The patient appears reasonably screened and/or stabilized for discharge and I doubt any other medical condition or other [facility] requiring further screening, evaluation, or  treatment in the ED at this time prior to discharge.]  Arthor Captain, PA-C 08/12/15 1604  Azalia Bilis, MD 08/14/15 2206

## 2015-08-07 NOTE — ED Notes (Signed)
Pt gone to CT 

## 2015-08-07 NOTE — ED Notes (Signed)
Pt is in stable condition upon d/c and is escorted from ED via wheelchair. 

## 2015-08-07 NOTE — ED Notes (Signed)
Patient here with increasing weakness and anxiety for several weeks. Patient has been tried on lexapro and xanax with minimal relief. Takes restoril at night. Flat affect on arrival, increased sleeping, decreased intake and weight loss. Patient alert on arrival and complains of headache

## 2015-08-07 NOTE — Discharge Instructions (Signed)
Fall Prevention in the Home  °Falls can cause injuries and can affect people from all age groups. There are many simple things that you can do to make your home safe and to help prevent falls. °WHAT CAN I DO ON THE OUTSIDE OF MY HOME? °· Regularly repair the edges of walkways and driveways and fix any cracks. °· Remove high doorway thresholds. °· Trim any shrubbery on the main path into your home. °· Use bright outdoor lighting. °· Clear walkways of debris and clutter, including tools and rocks. °· Regularly check that handrails are securely fastened and in good repair. Both sides of any steps should have handrails. °· Install guardrails along the edges of any raised decks or porches. °· Have leaves, snow, and ice cleared regularly. °· Use sand or salt on walkways during winter months. °· In the garage, clean up any spills right away, including grease or oil spills. °WHAT CAN I DO IN THE BATHROOM? °· Use night lights. °· Install grab bars by the toilet and in the tub and shower. Do not use towel bars as grab bars. °· Use non-skid mats or decals on the floor of the tub or shower. °· If you need to sit down while you are in the shower, use a plastic, non-slip stool.. °· Keep the floor dry. Immediately clean up any water that spills on the floor. °· Remove soap buildup in the tub or shower on a regular basis. °· Attach bath mats securely with double-sided non-slip rug tape. °· Remove throw rugs and other tripping hazards from the floor. °WHAT CAN I DO IN THE BEDROOM? °· Use night lights. °· Make sure that a bedside light is easy to reach. °· Do not use oversized bedding that drapes onto the floor. °· Have a firm chair that has side arms to use for getting dressed. °· Remove throw rugs and other tripping hazards from the floor. °WHAT CAN I DO IN THE KITCHEN?  °· Clean up any spills right away. °· Avoid walking on wet floors. °· Place frequently used items in easy-to-reach places. °· If you need to reach for something  above you, use a sturdy step stool that has a grab bar. °· Keep electrical cables out of the way. °· Do not use floor polish or wax that makes floors slippery. If you have to use wax, make sure that it is non-skid floor wax. °· Remove throw rugs and other tripping hazards from the floor. °WHAT CAN I DO IN THE STAIRWAYS? °· Do not leave any items on the stairs. °· Make sure that there are handrails on both sides of the stairs. Fix handrails that are broken or loose. Make sure that handrails are as long as the stairways. °· Check any carpeting to make sure that it is firmly attached to the stairs. Fix any carpet that is loose or worn. °· Avoid having throw rugs at the top or bottom of stairways, or secure the rugs with carpet tape to prevent them from moving. °· Make sure that you have a light switch at the top of the stairs and the bottom of the stairs. If you do not have them, have them installed. °WHAT ARE SOME OTHER FALL PREVENTION TIPS? °· Wear closed-toe shoes that fit well and support your feet. Wear shoes that have rubber soles or low heels. °· When you use a stepladder, make sure that it is completely opened and that the sides are firmly locked. Have someone hold the ladder while you   are using it. Do not climb a closed stepladder.  Add color or contrast paint or tape to grab bars and handrails in your home. Place contrasting color strips on the first and last steps.  Use mobility aids as needed, such as canes, walkers, scooters, and crutches.  Turn on lights if it is dark. Replace any light bulbs that burn out.  Set up furniture so that there are clear paths. Keep the furniture in the same spot.  Fix any uneven floor surfaces.  Choose a carpet design that does not hide the edge of steps of a stairway.  Be aware of any and all pets.  Review your medicines with your healthcare provider. Some medicines can cause dizziness or changes in blood pressure, which increase your risk of falling. Talk  with your health care provider about other ways that you can decrease your risk of falls. This may include working with a physical therapist or trainer to improve your strength, balance, and endurance.   This information is not intended to replace advice given to you by your health care provider. Make sure you discuss any questions you have with your health care provider.   Document Released: 02/07/2002 Document Revised: 07/04/2014 Document Reviewed: 03/24/2014 Elsevier Interactive Patient Education 2016 Elsevier Inc. Parkinson Disease Parkinson disease is a disorder of the central nervous system, which includes the brain and spinal cord. A person with this disease slowly loses the ability to completely control body movements. Within the brain, there is a group of nerve cells (basal ganglia) that help control movement. The basal ganglia are damaged and do not work properly in a person with Parkinson disease. In addition, the basal ganglia produce and use a brain chemical called dopamine. The dopamine chemical sends messages to other parts of the body to control and coordinate body movements. Dopamine levels are low in a person with Parkinson disease. If the dopamine levels are low, then the body does not receive the correct messages it needs to move normally.  CAUSES  The exact reason why the basal ganglia get damaged is not known. Some medical researchers have thought that infection, genes, environment, and certain medicines may contribute to the cause.  SYMPTOMS   An early symptom of Parkinson disease is often an uncontrolled shaking (tremor) of the hands. The tremor will often disappear when the affected hand is consciously used.  As the disease progresses, walking, talking, getting out of a chair, and new movements become more difficult.  Muscles get stiff and movements become slower.  Balance and coordination become harder.  Depression, trouble swallowing, urinary problems, constipation,  and sleep problems can occur.  Later in the disease, memory and thought processes may deteriorate. DIAGNOSIS  There are no specific tests to diagnose Parkinson disease. You may be referred to a neurologist for evaluation. Your caregiver will ask about your medical history, symptoms, and perform a physical exam. Blood tests and imaging tests of your brain may be performed to rule out other diseases. The imaging tests may include an MRI or a CT scan. TREATMENT  The goal of treatment is to relieve symptoms. Medicines may be prescribed once the symptoms become troublesome. Medicine will not stop the progression of the disease, but medicine can make movement and balance better and help control tremors. Speech and occupational therapy may also be prescribed. Sometimes, surgical treatment of the brain can be done in young people. HOME CARE INSTRUCTIONS  Get regular exercise and rest periods during the day to help prevent exhaustion  and depression.  If getting dressed becomes difficult, replace buttons and zippers with Velcro and elastic on your clothing.  Take all medicine as directed by your caregiver.  Install grab bars or railings in your home to prevent falls.  Go to speech or occupational therapy as directed.  Keep all follow-up visits as directed by your caregiver. SEEK MEDICAL CARE IF:  Your symptoms are not controlled with your medicine.  You fall.  You have trouble swallowing or choke on your food. MAKE SURE YOU:  Understand these instructions.  Will watch your condition.  Will get help right away if you are not doing well or get worse.   This information is not intended to replace advice given to you by your health care provider. Make sure you discuss any questions you have with your health care provider.   Document Released: 02/15/2000 Document Revised: 06/14/2012 Document Reviewed: 03/19/2011 Elsevier Interactive Patient Education [employer] Elsevier Inc.

## 2015-08-07 NOTE — Care Management Note (Addendum)
Case Management Note  Patient Details  Name: Charolette ChildLucille S Helt MRN: [id number] Date of Birth: 04/22/1932  Subjective/Objective:    Patient lives at home with husband, daughter lives about 15 minutes away.  Patient has had knee surgery prior and [facility].  Also has a walker, BSC and cane at home.  No other DME needs identified.  Agreeable to have HH with [facility] again.  [facility] and [facility], Darl PikesSusan.                Action/Plan:   Expected Discharge Date:      08-07-94            Expected Discharge Plan:       [facility]  In-House Referral:     Discharge planning Services     Post Acute Care Choice:    Choice offered to:     DME Arranged:    DME Agency:     HH Arranged:   PT, OT, NA HH Agency:   [facility]  Status of Service:     Medicare Important Message Given:    Date Medicare IM Given:    Medicare IM give by:    Date Additional Medicare IM Given:    Additional Medicare Important Message give by:     If discussed at Long Length of Stay Meetings, dates discussed:    Additional Comments:  Vangie BickerBrown, Latana Colin Jane, RN 08/07/2015, 3:08 PM

## 2015-08-08 ENCOUNTER — Ambulatory Visit (INDEPENDENT_AMBULATORY_CARE_PROVIDER_SITE_OTHER): Payer: Medicare Other | Admitting: Cardiovascular Disease

## 2015-08-08 ENCOUNTER — Encounter: Payer: Self-pay | Admitting: Cardiovascular Disease

## 2015-08-08 VITALS — BP 130/80 | HR 70 | Ht 65.0 in | Wt 153.0 lb

## 2015-08-08 DIAGNOSIS — I10 Essential (primary) hypertension: Secondary | ICD-10-CM | POA: Diagnosis not present

## 2015-08-08 DIAGNOSIS — I481 Persistent atrial fibrillation: Secondary | ICD-10-CM | POA: Diagnosis not present

## 2015-08-08 DIAGNOSIS — I4819 Other persistent atrial fibrillation: Secondary | ICD-10-CM

## 2015-08-08 NOTE — Patient Instructions (Signed)
Medication Instructions:  Your physician recommends that you continue on your current medications as directed. Please refer to the Current Medication list given to you today.   Labwork: None Ordered   Testing/Procedures: None Ordered   Follow-Up: Your physician wants you to follow-up in: 6 months with Dr. Nahser.  You will receive a reminder letter in the mail two months in advance. If you don't receive a letter, please call our office to schedule the follow-up appointment.   If you need a refill on your cardiac medications before your next appointment, please call your pharmacy.   Thank you for choosing [facility]! Etola Mull, RN [phone]    

## 2015-08-08 NOTE — Progress Notes (Signed)
Cardiology Office Note   Date:  08/08/2015   ID:  Angela Blackburn, DOB Sep 05, 1932, MRN [id number]  PCP:  Eartha Inch, MD  Cardiologist:   Kristeen Miss, MD   Chief Complaint  Patient presents with  . Atrial Fibrillation   1. Hypertension 2. History chest pains-negative stress Myoview study in November, 2009 3. Hypothyroidism 4. Hypercholesterolemia 5. Peripheral neuropathy 6. Atrial fibrillation-currently on his Xarelto to prevent thromboembolus. 7. Hx of syncope - May 2015.    History of Present Illness:  Angela Blackburn is a 19 her old female with a history of hypertension, hypercholesterolemia and a chest pain. She's had a negative stress test in November 2009. She also has a history of hypothyroidism.  She was diagnosed as having a peripheral neuropathy recently. She's been placed on some medication which seems to be helping some.  She's not eating quite as much exercise as she would like. She's had a little bit of shortness breath with exertion.   Oct. 8, 2013 -  She checks her blood pressure at home periodically. Her readings are on the low side. She has been cutting back on her salt. She's been having some episodes of jaw pain with radiation across her chest and into her shoulders. This typically occurs at night when she's resting. He typically does not occur with exertion. It lasts for about 5 minutes.   June 02, 2012:  She has complained of some chest discomfort when I last saw her in October. A stress Myoview study was normal. She has normal left ventricular function and no ischemia.  She has continued to have some jaw / gum pain which then goes to her chest . It typically occurs at night when she is in bed. Never occurs with exercise. She tries to exercise regularly ( water aerobics and walking)   Nov. 17, 2014:  She has had some chest pains earlier this year. She still has some jaw pain. Myoview study was normal.  She keeps her BP readings - all of  her home readings are normal.   She had a cardioversion last week - was successful. Unfortunately, she has already gone back into A-Fib. She cannot tell whether her rhythm is normal or in AFib.  She does have some leg swelling  May 18, 2013:  Angela Blackburn has not been doing as well. She has some shoulder blade pain - occurs with exertion. Better with rest or tylenol. Not associated with dyspnea or diaphoresis.   She was on Eliquis but she developed a rash. She is now on Xarelto which she tolerates well.   June 03, 2013:  We'll try her on metoprolol at her last visit but she became short of breath. We substituted diltiazem for the metoprolol. She does not think she is any better.   Sept. 8, 2015:  Angela Blackburn is doing well. She brought her blood pressure log from home. All of her blood pressure readings are in the normal range.. She had several episodes of syncope back in May, 2015. She has been restricted from driving until November, 4098. She's not had any recent episodes of syncope.   Feb. 26, 2016:  Angela Blackburn is a 82 y.o. female who presents for further evaluation of an episode of CP on the right side of her  chest  She went to the ER on Feb. 15.   Work up was negative.  The pain has largely resolved.  The pain was not associated with eating or drinking, nor exercise.  Not  associated with twisting or turning her torso.  Had not done any strenuous exercise.   3 weeks ago, she developed some back pain  She seems to be doing better at this point.   Aug. 29, 2016: Doing ok. Has nausea and shortness of breath that is typically started by a sneezing episodes   Dec. 9, 2016;  Doing lots of better.   Saw Scott since her last visit for orthostatic hypotension .   BP readings at home have been good.  . No CP ,  Occasional DOE .  August 08, 2015:  Has been having issues with anxiety , depression . Was in the ER yesterday .   Was thought to have Parkinsons.  Will be gioing  neuroology today at 2 PM  Was seen in the wheelchair.   Is very weak and is not able to walk very far at all.   She fell several times on Friday - legs gave way     Past Medical History  Diagnosis Date  . Chest pain     Hx  . Hypothyroidism   . Hyperlipidemia     no meds  . Knee pain   . Peripheral neuropathy (HCC)   . Fibromyalgia   . PAF (paroxysmal atrial fibrillation) (HCC)     a. 06/2012 Flecainide and Xarelto started;  b. 06/2012 DCCV;  c. 06/2012 Syncope in setting of bradycardia->flecainide and dilt d/c'd->Multaq started.  . Syncope     a. 06/2012 - in setting of bradycardia following dccv->flecainide and dilt d/c'd.  . SVD (spontaneous vaginal delivery)   . COPD (chronic obstructive pulmonary disease) (HCC)     uses inhaler  . Arthritis     knees/hands  . Hypertension     no meds currently - Dr Melburn PopperNasher  . History of nuclear stress test     Myoview 11/16: EF 75%, no ischemia. Low Risk  . UTI (lower urinary tract infection)     Past Surgical History  Procedure Laterality Date  . [place] echocardiography  09/14/2007    EF 55-60%  . Cardiovascular stress test  08/29/2009    EF 86%  . Abdominal hysterectomy    . Joint replacement      Lt. TKA in 2010  . Cardioversion N/A 01/11/2013    Procedure: CARDIOVERSION;  Surgeon: Lewayne BuntingBrian S Crenshaw, MD;  [facility];  Service: Cardiovascular;  Laterality: N/A;  . Cardioversion N/A 06/22/2013    Procedure: CARDIOVERSION;  Surgeon: Vesta MixerPhilip J Rainbow Salman, MD;  [facility];  Service: Cardiovascular;  Laterality: N/A;  . Colonoscopy    . Anterior and posterior repair N/A 12/21/2013    Procedure: ANTERIOR (CYSTOCELE) AND POSTERIOR REPAIR (RECTOCELE);  Surgeon: Lavina Hammanodd Meisinger, MD;  Location: [facility] ORS;  Service: Gynecology;  Laterality: N/A;     Current Outpatient Prescriptions  Medication Sig Dispense Refill  . acetaminophen (TYLENOL) 500 MG tablet Take 1,000 mg by mouth every 6 (six) hours as needed for pain.    [name]. albuterol  (PROAIR HFA) 108 (90 BASE) MCG/ACT inhaler Inhale 2 puffs into the lungs every 6 (six) hours as needed for shortness of breath.     . ALPRAZolam (XANAX) 0.25 MG tablet Take 0.25 mg by mouth 2 (two) times daily.     . calcium carbonate (OS-CAL) 600 MG TABS tablet Take 600 mg by mouth daily with breakfast.    . cephALEXin (KEFLEX) 250 MG capsule Take 250 mg by mouth daily.    . Cholecalciferol (VITAMIN D) 2000 UNITS tablet  Take 2,000 Units by mouth daily.     . Cranberry Extract 250 MG TABS Take 250 mg by mouth 3 (three) times daily.    [name] docusate sodium (COLACE) 100 MG capsule Take 200 mg by mouth daily.     [name] escitalopram (LEXAPRO) 20 MG tablet Take 20 mg by mouth daily. Pt gives at 630pm    . fish oil-omega-3 fatty acids 1000 MG capsule Take 1 g by mouth daily.    [name] KLOR-CON M20 20 MEQ tablet TAKE 1 TABLET BY MOUTH EVERY DAY 30 tablet 11  . levothyroxine (SYNTHROID, LEVOTHROID) 137 MCG tablet Take 137 mcg by mouth every evening.     [name] losartan (COZAAR) 25 MG tablet Take 25 mg by mouth daily.    . methylcellulose (ARTIFICIAL TEARS) 1 % ophthalmic solution Place 1 drop into both eyes daily as needed (for dry eyes).     . MULTAQ 400 MG tablet TAKE 1 TABLET (400 MG TOTAL) BY MOUTH 2 (TWO) TIMES DAILY WITH A MEAL. 60 tablet 11  . polyethylene glycol (MIRALAX / GLYCOLAX) packet Take 17 g by mouth daily as needed for mild constipation.    . Probiotic Product (ACIDOPHILUS/GOAT MILK) CAPS Take 1 capsule by mouth 2 (two) times daily.    . Rivaroxaban (XARELTO) 15 MG TABS tablet Take 1 tablet (15 mg total) by mouth daily with supper. 30 tablet 11  . temazepam (RESTORIL) 15 MG capsule Take 15 mg by mouth at bedtime as needed for sleep.    [name] tiotropium (SPIRIVA) 18 MCG inhalation capsule Place 18 mcg into inhaler and inhale daily.    [name] nystatin (MYCOSTATIN) 100000 UNIT/ML suspension Take 5 mLs by mouth 4 (four) times daily as needed. Reported on 08/08/2015     No current facility-administered medications for  this visit.    Allergies:   Metoprolol; Crestor; Lipitor; Livalo; Other; Zetia; Lisinopril; Penicillins; Statins; and Eliquis    Social History:  The patient  reports that she has never smoked. She has never used smokeless tobacco. She reports that she does not drink alcohol or use illicit drugs.   Family History:  The patient's family history includes Heart failure in her mother.    ROS:  Please see the history of present illness.    Review of Systems: Constitutional:  denies fever, chills, diaphoresis, appetite change and fatigue.  HEENT: denies photophobia, eye pain, redness, hearing loss, ear pain, congestion, sore throat, rhinorrhea, sneezing, neck pain, neck stiffness and tinnitus.  Respiratory: denies SOB, DOE, cough, chest tightness, and wheezing.  Cardiovascular: denies chest pain, palpitations and leg swelling.  Gastrointestinal: denies nausea, vomiting, abdominal pain, diarrhea, constipation, blood in stool.  Genitourinary: denies dysuria, urgency, frequency, hematuria, flank pain and difficulty urinating.  Musculoskeletal: denies  myalgias, back pain, joint swelling, arthralgias and gait problem.   Skin: denies pallor, rash and wound.  Neurological: denies dizziness, seizures, syncope, weakness, light-headedness, numbness and headaches.   Hematological: denies adenopathy, easy bruising, personal or family bleeding history.  Psychiatric/ Behavioral: denies suicidal ideation, mood changes, confusion, nervousness, sleep disturbance and agitation.       All other systems are reviewed and negative.    PHYSICAL EXAM: VS:  BP 130/80 mmHg  Pulse 70  Ht 5\' 5"  (1.651 m)  Wt 153 lb (69.4 kg)  BMI 25.46 kg/m2 , BMI Body mass index is 25.46 kg/(m^2). GEN: Well nourished, well developed, in no acute distress HEENT: normal Neck: no JVD, carotid bruits, or masses Cardiac: RR;   Soft systolic  murmur, rubs, or gallops,no edema ,  Occasional premature beats Respiratory:  clear to  auscultation bilaterally, normal work of breathing GI: soft, nontender, nondistended, + BS MS: no deformity or atrophy Skin: warm and dry, no rash Neuro:  Strength and sensation are intact Psych: normal   EKG:  EKG is not ordered today.    Recent Labs: 12/04/2014: ALT 26; B Natriuretic Peptide 120.7* 08/07/2015: BUN 16; Creatinine, Ser 1.06*; Hemoglobin 13.8; Platelets 263; Potassium 3.9; Sodium 136    Lipid Panel    Component Value Date/Time   CHOL 170 10/30/2014 0918   TRIG 87.0 10/30/2014 0918   HDL 56.80 10/30/2014 0918   CHOLHDL 3 10/30/2014 0918   VLDL 17.4 10/30/2014 0918   LDLCALC 96 10/30/2014 0918   LDLDIRECT 121.6 10/01/2011 0926      Wt Readings from Last 3 Encounters:  08/08/15 153 lb (69.4 kg)  07/21/15 155 lb (70.308 kg)  02/09/15 160 lb (72.576 kg)      Other studies Reviewed: Additional studies/ records that were reviewed today include: ER visit.. Review of the above records demonstrates: negative work up .   ASSESSMENT AND PLAN:  1. Hypertension - BP is well controlloed   2. History chest pains-negative stress Myoview study in November, 2009, 2011, 2013.  Her chest pain sounds noncardiac. I do not think that she needs another Myoview study at this time.  3. Hypothyroidism  4. Hypercholesterolemia -   Labs checked today   5. Peripheral neuropathy 6. Atrial fibrillation-currently on his Xarelto.    Has had cardioversion last year .  Maintaining  NSR , on Multaq Heart is very regular . Continue current meds.   7. Hx of syncope - May 2015.  8. Dyspnea:   Recent echo shows:  Left ventricle: The cavity size was normal. Wall thickness was normal. Systolic function was normal. The estimated ejection fraction was in the range of 55% to 60%. Wall motion was normal; there were no regional wall motion abnormalities. Left ventricular diastolic function parameters were normal. - Mitral valve: Calcified annulus. Mildly thickened leaflets  . There was mild regurgitation. - Tricuspid valve: There was moderate regurgitation. - Pulmonary arteries: Systolic pressure was mildly increased. PA peak pressure: 42 mm Hg (S   Current medicines are reviewed at length with the patient today.  The patient does not have concerns regarding medicines.  The following changes have been made:  no change  Disposition:   FU with me in 6 months .    Signed, Kristeen Miss, MD  08/08/2015 11:01 AM    [facility] [address] [place], [place], [place]  [address] Phone: [phone]; Fax: [phone]

## 2015-08-17 ENCOUNTER — Encounter (HOSPITAL_COMMUNITY): Payer: Self-pay | Admitting: Emergency Medicine

## 2015-08-17 ENCOUNTER — Observation Stay (HOSPITAL_COMMUNITY)
Admission: EM | Admit: 2015-08-17 | Discharge: 2015-08-21 | Disposition: A | Discharge status: Skilled Nursing Facility | Payer: Medicare Other | Attending: Internal Medicine | Admitting: Internal Medicine

## 2015-08-17 DIAGNOSIS — R531 Weakness: Secondary | ICD-10-CM

## 2015-08-17 DIAGNOSIS — I10 Essential (primary) hypertension: Secondary | ICD-10-CM | POA: Insufficient documentation

## 2015-08-17 DIAGNOSIS — G4700 Insomnia, unspecified: Secondary | ICD-10-CM | POA: Insufficient documentation

## 2015-08-17 DIAGNOSIS — I480 Paroxysmal atrial fibrillation: Secondary | ICD-10-CM | POA: Diagnosis not present

## 2015-08-17 DIAGNOSIS — R4189 Other symptoms and signs involving cognitive functions and awareness: Secondary | ICD-10-CM | POA: Diagnosis not present

## 2015-08-17 DIAGNOSIS — Z7901 Long term (current) use of anticoagulants: Secondary | ICD-10-CM | POA: Diagnosis not present

## 2015-08-17 DIAGNOSIS — F09 Unspecified mental disorder due to known physiological condition: Secondary | ICD-10-CM | POA: Diagnosis not present

## 2015-08-17 DIAGNOSIS — R634 Abnormal weight loss: Secondary | ICD-10-CM

## 2015-08-17 DIAGNOSIS — F419 Anxiety disorder, unspecified: Secondary | ICD-10-CM | POA: Diagnosis not present

## 2015-08-17 DIAGNOSIS — J449 Chronic obstructive pulmonary disease, unspecified: Secondary | ICD-10-CM | POA: Diagnosis not present

## 2015-08-17 DIAGNOSIS — E43 Unspecified severe protein-calorie malnutrition: Secondary | ICD-10-CM | POA: Diagnosis not present

## 2015-08-17 DIAGNOSIS — I6782 Cerebral ischemia: Secondary | ICD-10-CM | POA: Diagnosis not present

## 2015-08-17 DIAGNOSIS — R269 Unspecified abnormalities of gait and mobility: Secondary | ICD-10-CM | POA: Insufficient documentation

## 2015-08-17 DIAGNOSIS — Z6822 Body mass index (BMI) 22.0-22.9, adult: Secondary | ICD-10-CM | POA: Diagnosis not present

## 2015-08-17 DIAGNOSIS — Z79899 Other long term (current) drug therapy: Secondary | ICD-10-CM | POA: Diagnosis not present

## 2015-08-17 DIAGNOSIS — F329 Major depressive disorder, single episode, unspecified: Secondary | ICD-10-CM | POA: Insufficient documentation

## 2015-08-17 DIAGNOSIS — E039 Hypothyroidism, unspecified: Secondary | ICD-10-CM

## 2015-08-17 DIAGNOSIS — I481 Persistent atrial fibrillation: Secondary | ICD-10-CM | POA: Diagnosis not present

## 2015-08-17 DIAGNOSIS — I4891 Unspecified atrial fibrillation: Secondary | ICD-10-CM | POA: Diagnosis present

## 2015-08-17 DIAGNOSIS — G629 Polyneuropathy, unspecified: Secondary | ICD-10-CM | POA: Diagnosis not present

## 2015-08-17 DIAGNOSIS — Z8673 Personal history of transient ischemic attack (TIA), and cerebral infarction without residual deficits: Secondary | ICD-10-CM | POA: Diagnosis not present

## 2015-08-17 DIAGNOSIS — R627 Adult failure to thrive: Secondary | ICD-10-CM | POA: Diagnosis present

## 2015-08-17 DIAGNOSIS — R3129 Other microscopic hematuria: Secondary | ICD-10-CM | POA: Diagnosis not present

## 2015-08-17 DIAGNOSIS — Z792 Long term (current) use of antibiotics: Secondary | ICD-10-CM | POA: Diagnosis not present

## 2015-08-17 DIAGNOSIS — R296 Repeated falls: Secondary | ICD-10-CM | POA: Insufficient documentation

## 2015-08-17 DIAGNOSIS — W19XXXA Unspecified fall, initial encounter: Secondary | ICD-10-CM

## 2015-08-17 HISTORY — DX: Unspecified dementia, unspecified severity, without behavioral disturbance, psychotic disturbance, mood disturbance, and anxiety: F03.90

## 2015-08-17 LAB — HEPATIC FUNCTION PANEL
ALT: 21 U/L (ref 14–54)
AST: 28 U/L (ref 15–41)
Albumin: 4 g/dL (ref 3.5–5.0)
Alkaline Phosphatase: 45 U/L (ref 38–126)
Bilirubin, Direct: 0.1 mg/dL (ref 0.1–0.5)
Indirect Bilirubin: 1.3 mg/dL — ABNORMAL HIGH (ref 0.3–0.9)
Total Bilirubin: 1.4 mg/dL — ABNORMAL HIGH (ref 0.3–1.2)
Total Protein: 7.5 g/dL (ref 6.5–8.1)

## 2015-08-17 LAB — CBC WITH DIFFERENTIAL/PLATELET
Basophils Absolute: 0 K/uL (ref 0.0–0.1)
Basophils Relative: 0 %
Eosinophils Absolute: 0.1 K/uL (ref 0.0–0.7)
Eosinophils Relative: 1 %
HCT: 40.1 % (ref 36.0–46.0)
Hemoglobin: 13.4 g/dL (ref 12.0–15.0)
Lymphocytes Relative: 30 %
Lymphs Abs: 2.2 K/uL (ref 0.7–4.0)
MCH: 30.2 pg (ref 26.0–34.0)
MCHC: 33.4 g/dL (ref 30.0–36.0)
MCV: 90.5 fL (ref 78.0–100.0)
Monocytes Absolute: 0.5 K/uL (ref 0.1–1.0)
Monocytes Relative: 6 %
Neutro Abs: 4.7 K/uL (ref 1.7–7.7)
Neutrophils Relative %: 63 %
Platelets: 241 K/uL (ref 150–400)
RBC: 4.43 MIL/uL (ref 3.87–5.11)
RDW: 13.3 % (ref 11.5–15.5)
WBC: 7.4 K/uL (ref 4.0–10.5)

## 2015-08-17 LAB — BASIC METABOLIC PANEL WITH GFR
Anion gap: 9 (ref 5–15)
BUN: 24 mg/dL — ABNORMAL HIGH (ref 6–20)
CO2: 25 mmol/L (ref 22–32)
Calcium: 9.4 mg/dL (ref 8.9–10.3)
Chloride: 103 mmol/L (ref 101–111)
Creatinine, Ser: 0.97 mg/dL (ref 0.44–1.00)
GFR calc Af Amer: >60 mL/min
GFR calc non Af Amer: 53 mL/min — ABNORMAL LOW
Glucose, Bld: 94 mg/dL (ref 65–99)
Potassium: 4.1 mmol/L (ref 3.5–5.1)
Sodium: 137 mmol/L (ref 135–145)

## 2015-08-17 LAB — URINE MICROSCOPIC-ADD ON

## 2015-08-17 LAB — URINALYSIS, ROUTINE W REFLEX MICROSCOPIC
Bilirubin Urine: NEGATIVE
Glucose, UA: NEGATIVE mg/dL
Ketones, ur: NEGATIVE mg/dL
Leukocytes, UA: NEGATIVE
Nitrite: NEGATIVE
Protein, ur: NEGATIVE mg/dL
Specific Gravity, Urine: 1.022 (ref 1.005–1.030)
pH: 6 (ref 5.0–8.0)

## 2015-08-17 MED ORDER — DRONEDARONE HCL 400 MG PO TABS
400.0000 mg | ORAL_TABLET | Freq: Once | ORAL | Status: AC
  Administered 2015-08-17: 400 mg via ORAL
  Filled 2015-08-17: qty 1

## 2015-08-17 MED ORDER — ONDANSETRON HCL 4 MG PO TABS: 4.0000 mg | ORAL_TABLET | Freq: Four times a day (QID) | ORAL | Status: DC | PRN

## 2015-08-17 MED ORDER — RIVAROXABAN 15 MG PO TABS
15.0000 mg | ORAL_TABLET | Freq: Every day | ORAL | Status: DC
  Administered 2015-08-18 – 2015-08-20 (×3): 15 mg via ORAL
  Filled 2015-08-17 (×3): qty 1

## 2015-08-17 MED ORDER — LEVOTHYROXINE SODIUM 25 MCG PO TABS
137.0000 ug | ORAL_TABLET | Freq: Once | ORAL | Status: AC
  Administered 2015-08-17: 137 ug via ORAL
  Filled 2015-08-17 (×2): qty 1

## 2015-08-17 MED ORDER — POTASSIUM CHLORIDE CRYS ER 20 MEQ PO TBCR
20.0000 meq | EXTENDED_RELEASE_TABLET | Freq: Every day | ORAL | Status: DC
  Administered 2015-08-18 – 2015-08-21 (×4): 20 meq via ORAL
  Filled 2015-08-17 (×4): qty 1

## 2015-08-17 MED ORDER — TEMAZEPAM 15 MG PO CAPS
15.0000 mg | ORAL_CAPSULE | Freq: Every evening | ORAL | Status: DC | PRN
Start: 2015-08-17 — End: 2015-08-18

## 2015-08-17 MED ORDER — ACETAMINOPHEN 650 MG RE SUPP: 650.0000 mg | Freq: Four times a day (QID) | RECTAL | Status: DC | PRN

## 2015-08-17 MED ORDER — ONDANSETRON HCL 4 MG/2ML IJ SOLN: 4.0000 mg | Freq: Four times a day (QID) | INTRAMUSCULAR | Status: DC | PRN

## 2015-08-17 MED ORDER — LOSARTAN POTASSIUM 25 MG PO TABS
25.0000 mg | ORAL_TABLET | Freq: Every day | ORAL | Status: DC
  Administered 2015-08-18 – 2015-08-21 (×4): 25 mg via ORAL
  Filled 2015-08-17 (×4): qty 1

## 2015-08-17 MED ORDER — SODIUM CHLORIDE 0.9% FLUSH
3.0000 mL | Freq: Two times a day (BID) | INTRAVENOUS | Status: DC
  Administered 2015-08-17 – 2015-08-20 (×6): 3 mL via INTRAVENOUS

## 2015-08-17 MED ORDER — DRONEDARONE HCL 400 MG PO TABS
400.0000 mg | ORAL_TABLET | Freq: Two times a day (BID) | ORAL | Status: DC
  Administered 2015-08-18 – 2015-08-21 (×7): 400 mg via ORAL
  Filled 2015-08-17 (×7): qty 1

## 2015-08-17 MED ORDER — ESCITALOPRAM OXALATE 10 MG PO TABS
5.0000 mg | ORAL_TABLET | Freq: Every day | ORAL | Status: AC
  Administered 2015-08-18 – 2015-08-19 (×2): 5 mg via ORAL
  Filled 2015-08-17 (×2): qty 1

## 2015-08-17 MED ORDER — LEVOTHYROXINE SODIUM 25 MCG PO TABS
137.0000 ug | ORAL_TABLET | Freq: Every day | ORAL | Status: DC
  Administered 2015-08-18 – 2015-08-20 (×3): 137 ug via ORAL
  Filled 2015-08-17 (×3): qty 1

## 2015-08-17 MED ORDER — RIVAROXABAN 15 MG PO TABS
15.0000 mg | ORAL_TABLET | Freq: Once | ORAL | Status: AC
  Administered 2015-08-17: 15 mg via ORAL
  Filled 2015-08-17: qty 1

## 2015-08-17 MED ORDER — ACETAMINOPHEN 325 MG PO TABS: 650.0000 mg | ORAL_TABLET | Freq: Four times a day (QID) | ORAL | Status: DC | PRN

## 2015-08-17 MED ORDER — ALBUTEROL SULFATE (2.5 MG/3ML) 0.083% IN NEBU: 2.5000 mg | INHALATION_SOLUTION | Freq: Four times a day (QID) | RESPIRATORY_TRACT | Status: DC | PRN

## 2015-08-17 MED ORDER — CEPHALEXIN 250 MG PO CAPS
250.0000 mg | ORAL_CAPSULE | Freq: Every day | ORAL | Status: DC
  Administered 2015-08-18 – 2015-08-21 (×4): 250 mg via ORAL
  Filled 2015-08-17 (×4): qty 1

## 2015-08-17 MED ORDER — CLONAZEPAM 0.5 MG PO TABS
0.5000 mg | ORAL_TABLET | Freq: Three times a day (TID) | ORAL | Status: DC | PRN
  Administered 2015-08-18: 0.5 mg via ORAL
  Filled 2015-08-17: qty 1

## 2015-08-17 NOTE — ED Notes (Signed)
[facility] called. 

## 2015-08-17 NOTE — H&P (Signed)
History and Physical    LEONELA KIVI WUJ:[id number] DOB: [birth year] DOA: 08/17/2015  PCP: Eartha Inch, MD   Patient coming from: Home  Chief Complaint: Adult failure to thrive, cognitive changes, weakness, falls, gait disturbance  HPI: Angela Blackburn is a 82 y.o. woman with a history of HTN, HLD, paroxysmal atrial fibrillation anticoagulated with Xarelto, and hypothyroidism who has had progressive weakness with associated gait disturbance and falls over the past several weeks.  She has also had a resting tremor, change in her demeanor, and has stopped feeding herself.  She was evaluated in the ED earlier this month, and a diagnosis of Parkinson's disease was suggested.  Of note, CT of the head showed prior infarcts, which the family had not been aware of.  She followed up with neurology as an outpatient as recommended.  Per the family, there has not been a formal diagnosis for her symptoms, but dementia was also mentioned.  She was referred for outpatient MRI, which did not happen due to combativeness/patietn noncompliance.  She was advised to come to the ED for further evaluation.  The family feels that the patient has declined even over the past 24 hours.  She is requiring two person assistance with ambulation, which is new.  She is not eating well, and requires assistance with meals.  No pain complaints.  No dysuria.  No bites.  No rashes.  Daughter admits that symptoms have been worse since starting lexapro several weeks ago.  Apparently, there was an initial concern for depression.  Lexapro is being tapered off; the patient has two doses left.  Also, the patient is now on restoril for insomnia AND klonopin prn for anxiety.  These medications are also new in the past 3-4 weeks.  ED Course:  EKG shows atrial fibrillation, rate controlled.  Labs are essentially unremarkable.  Hospitalist asked to facilitate admission to [facility] for MRI with sedation.  I have also requested neurology  consultation.  Review of Systems: 20 lb weight loss in the past 2-3 months.  Otherwise, 10 systems reviewed and negative except as stated in the HPI.  Past Medical History  Diagnosis Date  . Chest pain     Hx  . Hypothyroidism   . Hyperlipidemia     no meds  . Knee pain   . Peripheral neuropathy (HCC)   . Fibromyalgia   . PAF (paroxysmal atrial fibrillation) (HCC)     a. 06/2012 Flecainide and Xarelto started;  b. 06/2012 DCCV;  c. 06/2012 Syncope in setting of bradycardia->flecainide and dilt d/c'd->Multaq started.  . Syncope     a. 06/2012 - in setting of bradycardia following dccv->flecainide and dilt d/c'd.  . SVD (spontaneous vaginal delivery)   . COPD (chronic obstructive pulmonary disease) (HCC)     uses inhaler  . Arthritis     knees/hands  . Hypertension     no meds currently - Dr Melburn Popper  . History of nuclear stress test     Myoview 11/16: EF 75%, no ischemia. Low Risk  . UTI (lower urinary tract infection)   . Dementia     Past Surgical History  Procedure Laterality Date  . US echocardiography  09/14/2007    EF 55-60%  . Cardiovascular stress test  08/29/2009    EF 86%  . Abdominal hysterectomy    . Joint replacement      Lt. TKA in 2010  . Cardioversion N/A 01/11/2013    Procedure: CARDIOVERSION;  Surgeon: Lewayne Bunting, MD;  Location: [facility] ENDOSCOPY;  Service: Cardiovascular;  Laterality: N/A;  . Cardioversion N/A 06/22/2013    Procedure: CARDIOVERSION;  Surgeon: Vesta Mixer, MD;  Location[facility] ENDOSCOPY;  Service: Cardiovascular;  Laterality: N/A;  . Colonoscopy    . Anterior and posterior repair N/A 12/21/2013    Procedure: ANTERIOR (CYSTOCELE) AND POSTERIOR REPAIR (RECTOCELE);  Surgeon: Lavina Hamman, MD;  Location: [facility] ORS;  Service: Gynecology;  Laterality: N/A;     reports that she has never smoked. She has never used smokeless tobacco. She reports that she does not drink alcohol or use illicit drugs.  She is married.  Still lives at home with her  husband.  One daughter present today.  Allergies  Allergen Reactions  . Metoprolol Shortness Of Breath  . Crestor [Rosuvastatin Calcium] Other (See Comments)    myalgia  . Lipitor [Atorvastatin Calcium] Other (See Comments)    myalgia  . Livalo [Pitavastatin Calcium] Other (See Comments)    myalgia  . Other Other (See Comments)    All CHOLESTEROL MEDS-cause myalgia  . Zetia [Ezetimibe] Other (See Comments)    myalgia  . Lisinopril Cough  . Penicillins     Rash Can tolerate Amoxicillin well. Has patient had a PCN reaction causing immediate rash, facial/tongue/throat swelling, SOB or lightheadedness with hypotension: Unknown Has patient had a PCN reaction causing severe rash involving mucus membranes or skin necrosis: No Has patient had a PCN reaction that required hospitalization No Has patient had a PCN reaction occurring within the last 10 years: No If all of the above answers are "NO", then may proceed with Cephalosporin use.    . Statins Other (See Comments)    myalgia  . Eliquis [Apixaban]     Rash.    Family History  Problem Relation Age of Onset  . Heart failure Mother     Prior to Admission medications   Medication Sig Start Date End Date Taking? Authorizing Provider  albuterol (PROAIR HFA) 108 (90 BASE) MCG/ACT inhaler Inhale 2 puffs into the lungs every 6 (six) hours as needed for shortness of breath.    Yes Historical Provider, MD  cephALEXin (KEFLEX) 250 MG capsule Take 250 mg by mouth daily. 09/14/14  Yes Historical Provider, MD  clonazePAM (KLONOPIN) 0.5 MG tablet Take 0.5 mg by mouth 3 (three) times daily as needed. Anxiety. 08/14/15  Yes Historical Provider, MD  escitalopram (LEXAPRO) 20 MG tablet Take 20 mg by mouth daily.    Yes Historical Provider, MD  KLOR-CON M20 20 MEQ tablet TAKE 1 TABLET BY MOUTH EVERY DAY 01/18/15  Yes Vesta Mixer, MD  levothyroxine (SYNTHROID, LEVOTHROID) 137 MCG tablet Take 137 mcg by mouth every evening.    Yes Historical  Provider, MD  losartan (COZAAR) 25 MG tablet Take 25 mg by mouth daily.   Yes Historical Provider, MD  MULTAQ 400 MG tablet TAKE 1 TABLET (400 MG TOTAL) BY MOUTH 2 (TWO) TIMES DAILY WITH A MEAL. 02/07/15  Yes Vesta Mixer, MD  Rivaroxaban (XARELTO) 15 MG TABS tablet Take 1 tablet (15 mg total) by mouth daily with supper. 12/22/14  Yes Scott T Alben Spittle, PA-C  temazepam (RESTORIL) 15 MG capsule Take 15 mg by mouth at bedtime as needed for sleep.   Yes Historical Provider, MD    Physical Exam: Filed Vitals:   08/17/15 1416 08/17/15 1749 08/17/15 2030  BP: 170/71 164/86 167/98  Pulse: 80 82 78  Temp: 97.6 F (36.4 C)    TempSrc: Oral    Resp:  16 18 24   SpO2: 97% 96% 98%      Constitutional: NAD, calm, comfortable Filed Vitals:   08/17/15 1416 08/17/15 1749 08/17/15 2030  BP: 170/71 164/86 167/98  Pulse: 80 82 78  Temp: 97.6 F (36.4 C)    TempSrc: Oral    Resp: 16 18 24   SpO2: 97% 96% 98%   Eyes: PERRL, lids and conjunctivae normal ENMT: Mucous membranes are moist. Posterior pharynx clear of any exudate or lesions.Normal dentition.  Neck: normal, supple Respiratory: clear to auscultation bilaterally, no wheezing, no crackles. Normal respiratory effort. No accessory muscle use.  Cardiovascular: Irregular but rate controlled.  No extremity edema. 2+ pedal pulses. Abdomen: no tenderness, no distention, no masses palpated. Bowel sounds positive.  Musculoskeletal: no clubbing / cyanosis. No joint deformity upper and lower extremities. Good ROM, no contractures. Normal muscle tone.  Skin: no rashes, bruising noted, warm and dry Neurologic: CN 2-12 grossly intact. Sensation intact, generalized weakness but symmetric, coordination intact.  Speech is slow but not slurred.  Response times are delayed. Psychiatric: alert and oriented but flat affect, judgement and insight are impaired    Labs on Admission: I have personally reviewed following labs and imaging studies  CBC:  Recent  Labs Lab 08/17/15 1719  WBC 7.4  NEUTROABS 4.7  HGB 13.4  HCT 40.1  MCV 90.5  PLT 241   Basic Metabolic Panel:  Recent Labs Lab 08/17/15 1719  NA 137  K 4.1  CL 103  CO2 25  GLUCOSE 94  BUN 24*  CREATININE 0.97  CALCIUM 9.4   GFR: Estimated Creatinine Clearance: 44.5 mL/min (by C-G formula based on Cr of 0.97). Liver Function Tests:  Recent Labs Lab 08/17/15 1719  AST 28  ALT 21  ALKPHOS 45  BILITOT 1.4*  PROT 7.5  ALBUMIN 4.0   Urine analysis:    Component Value Date/Time   COLORURINE YELLOW 08/17/2015 1800   APPEARANCEUR CLEAR 08/17/2015 1800   LABSPEC 1.022 08/17/2015 1800   PHURINE 6.0 08/17/2015 1800   GLUCOSEU NEGATIVE 08/17/2015 1800   HGBUR MODERATE* 08/17/2015 1800   BILIRUBINUR NEGATIVE 08/17/2015 1800   KETONESUR NEGATIVE 08/17/2015 1800   PROTEINUR NEGATIVE 08/17/2015 1800   UROBILINOGEN 0.2 06/25/2013 1444   NITRITE NEGATIVE 08/17/2015 1800   LEUKOCYTESUR NEGATIVE 08/17/2015 1800    EKG: Independently reviewed. Rate controlled atrial fibrillation.  I do not see ST elevations.  Assessment/Plan Principal Problem:   Weakness Active Problems:   HTN (hypertension)   Atrial fibrillation (HCC)   Failure to thrive in adult   Falls   Cognitive impairment   Hypothyroidism    Adult failure to thrive with new cognitive impairment, falls, weakness, and gait disturbance --Neurology consult requested --MRI with and without contrast with sedation --Will check TSH, free T4, B12, and folate levels --Weaning from lexapro --Consider alternatives to benzodiazepines as well --Consider Behavioral Health consult for assistance with mood disturbance  HTN --Continue Cozaar  History of atrial fibrillation --Continue Multaq, Xarelto  Hypothyroidism --Continue home dose of levothyroxine for now  Microcopic hematuria noted on U/A, suspect this is influenced by Xarelto but with history of weight loss, should pursue imaging.  I will start with with  renal ultrasound; if unrevealing, consider CT.   DVT prophylaxis: Anticoagulated with Xarelto Code Status: FULL Family Communication: Husband and daughter present in the ED at time of admission Disposition Plan: To be determined; family thinks she will need SNF Consults called: Neurology, Dr. Desmond Lopeurk Admission status: Observation, telemetry   [facility],Robin Petrakis  Romeo Apple MD [facility]  If 7PM-7AM, please contact night-coverage [email or web address] Password [facility]  08/17/2015, 8:36 PM

## 2015-08-17 NOTE — ED Provider Notes (Signed)
CSN: [id number]     Arrival date & time 08/17/15  1403 History   First MD Initiated Contact with Patient 08/17/15 1609     Chief Complaint  Patient presents with  . Failure To Thrive     (Consider location/radiation/quality/duration/timing/severity/associated sxs/prior Treatment) HPI   FLANNERY CAVALLERO is an 82 y.o F with a pmhx of PAFIB, COPD, HLD who presents to the ED today brought in by her family to be evaluated for failure to thrive. Level V caveat, dementia. Patient's daughter states that over the last 8-9 weeks patient has had a decline in cognitive function as well as her mobility. Patient has not been eating as much as normal. She was seen in the ED 10 days ago with similar symptoms and at that time it was thought that the patient may have Parkinson's and she is also experiencing tremors, pill rolling. Patient was referred to neurology which she saw one week ago who does not feel that she is Parkinson's and her symptoms are likely related to frontal/temporal lobe atrophy. Pt was scheduled to have an outpatient MRI performed yesterday but per pts daughter the pt was so combative they had to cancel the appointment. Pt daughter states that this morning pt was unable to walk. They contact pts PCP and neurologist who recommended that pt come here for MRI under sedation and to possibly be admitted. Family feels that pt will need SNF placement.   Past Medical History  Diagnosis Date  . Chest pain     Hx  . Hypothyroidism   . Hyperlipidemia     no meds  . Knee pain   . Peripheral neuropathy (HCC)   . Fibromyalgia   . PAF (paroxysmal atrial fibrillation) (HCC)     a. 06/2012 Flecainide and Xarelto started;  b. 06/2012 DCCV;  c. 06/2012 Syncope in setting of bradycardia->flecainide and dilt d/c'd->Multaq started.  . Syncope     a. 06/2012 - in setting of bradycardia following dccv->flecainide and dilt d/c'd.  . SVD (spontaneous vaginal delivery)   . COPD (chronic obstructive pulmonary  disease) (HCC)     uses inhaler  . Arthritis     knees/hands  . Hypertension     no meds currently - Dr Melburn Popper  . History of nuclear stress test     Myoview 11/16: EF 75%, no ischemia. Low Risk  . UTI (lower urinary tract infection)   . Dementia    Past Surgical History  Procedure Laterality Date  . US echocardiography  09/14/2007    EF 55-60%  . Cardiovascular stress test  08/29/2009    EF 86%  . Abdominal hysterectomy    . Joint replacement      Lt. TKA in 2010  . Cardioversion N/A 01/11/2013    Procedure: CARDIOVERSION;  Surgeon: Lewayne Bunting, MD;  Location: [facility] ENDOSCOPY;  Service: Cardiovascular;  Laterality: N/A;  . Cardioversion N/A 06/22/2013    Procedure: CARDIOVERSION;  Surgeon: Vesta Mixer, MD;  Location: [facility] ENDOSCOPY;  Service: Cardiovascular;  Laterality: N/A;  . Colonoscopy    . Anterior and posterior repair N/A 12/21/2013    Procedure: ANTERIOR (CYSTOCELE) AND POSTERIOR REPAIR (RECTOCELE);  Surgeon: Lavina Hamman, MD;  Location: [facility] ORS;  Service: Gynecology;  Laterality: N/A;   Family History  Problem Relation Age of Onset  . Heart failure Mother    Social History  Substance Use Topics  . Smoking status: Never Smoker   . Smokeless tobacco: Never Used  . Alcohol Use: No  OB History    No data available     Review of Systems  Unable to perform ROS: Mental status change      Allergies  Metoprolol; Crestor; Lipitor; Livalo; Other; Zetia; Lisinopril; Penicillins; Statins; and Eliquis  Home Medications   Prior to Admission medications   Medication Sig Start Date End Date Taking? Authorizing Provider  acetaminophen (TYLENOL) 500 MG tablet Take 1,000 mg by mouth every 6 (six) hours as needed for pain.    Historical Provider, MD  albuterol (PROAIR HFA) 108 (90 BASE) MCG/ACT inhaler Inhale 2 puffs into the lungs every 6 (six) hours as needed for shortness of breath.     Historical Provider, MD  ALPRAZolam Prudy Feeler) 0.25 MG tablet Take 0.25 mg by mouth  2 (two) times daily.     Historical Provider, MD  calcium carbonate (OS-CAL) 600 MG TABS tablet Take 600 mg by mouth daily with breakfast.    Historical Provider, MD  cephALEXin (KEFLEX) 250 MG capsule Take 250 mg by mouth daily. 09/14/14   Historical Provider, MD  Cholecalciferol (VITAMIN D) 2000 UNITS tablet Take 2,000 Units by mouth daily.     Historical Provider, MD  Cranberry Extract 250 MG TABS Take 250 mg by mouth 3 (three) times daily.    Historical Provider, MD  docusate sodium (COLACE) 100 MG capsule Take 200 mg by mouth daily.     Historical Provider, MD  escitalopram (LEXAPRO) 20 MG tablet Take 20 mg by mouth daily. Pt gives at 630pm    Historical Provider, MD  fish oil-omega-3 fatty acids 1000 MG capsule Take 1 g by mouth daily.    Historical Provider, MD  KLOR-CON M20 20 MEQ tablet TAKE 1 TABLET BY MOUTH EVERY DAY 01/18/15   Vesta Mixer, MD  levothyroxine (SYNTHROID, LEVOTHROID) 137 MCG tablet Take 137 mcg by mouth every evening.     Historical Provider, MD  losartan (COZAAR) 25 MG tablet Take 25 mg by mouth daily.    Historical Provider, MD  methylcellulose (ARTIFICIAL TEARS) 1 % ophthalmic solution Place 1 drop into both eyes daily as needed (for dry eyes).     Historical Provider, MD  MULTAQ 400 MG tablet TAKE 1 TABLET (400 MG TOTAL) BY MOUTH 2 (TWO) TIMES DAILY WITH A MEAL. 02/07/15   Vesta Mixer, MD  nystatin (MYCOSTATIN) 100000 UNIT/ML suspension Take 5 mLs by mouth 4 (four) times daily as needed. Reported on 08/08/2015 02/27/15   Historical Provider, MD  polyethylene glycol (MIRALAX / GLYCOLAX) packet Take 17 g by mouth daily as needed for mild constipation.    Historical Provider, MD  Probiotic Product (ACIDOPHILUS/GOAT MILK) CAPS Take 1 capsule by mouth 2 (two) times daily.    Historical Provider, MD  Rivaroxaban (XARELTO) 15 MG TABS tablet Take 1 tablet (15 mg total) by mouth daily with supper. 12/22/14   Beatrice Lecher, PA-C  temazepam (RESTORIL) 15 MG capsule Take 15 mg  by mouth at bedtime as needed for sleep.    Historical Provider, MD  tiotropium (SPIRIVA) 18 MCG inhalation capsule Place 18 mcg into inhaler and inhale daily.    Historical Provider, MD   BP 170/71 mmHg  Pulse 80  Temp(Src) 97.6 F (36.4 C) (Oral)  Resp 16  SpO2 97% Physical Exam  Constitutional: No distress.  Elderly, frail-appearing female lying in bed.  HENT:  Head: Normocephalic and atraumatic.  Mouth/Throat: No oropharyngeal exudate.  Eyes: Conjunctivae and EOM are normal. Pupils are equal, round, and reactive to light. Right  eye exhibits no discharge. Left eye exhibits no discharge. No scleral icterus.  Cardiovascular: Normal rate, regular rhythm, normal heart sounds and intact distal pulses.  Exam reveals no gallop and no friction rub.   No murmur heard. Pulmonary/Chest: Effort normal and breath sounds normal. No respiratory distress. She has no wheezes. She has no rales. She exhibits no tenderness.  Abdominal: Soft. She exhibits no distension. There is no tenderness. There is no guarding.  Musculoskeletal: Normal range of motion. She exhibits no edema.  Neurological: She is alert. She displays normal reflexes. No cranial nerve deficit. Coordination abnormal.  Disoriented to person place and time. Unable to follow commands secondary to cognitive impairment. Tremors present in bilateral upper extremity.  Skin: Skin is warm and dry. No rash noted. She is not diaphoretic. No erythema. No pallor.  Psychiatric: Her affect is blunt. Cognition and memory are impaired. She is noncommunicative.  Nursing note and vitals reviewed.   ED Course  Procedures (including critical care time) Labs Review Labs Reviewed  BASIC METABOLIC PANEL  CBC WITH DIFFERENTIAL/PLATELET  HEPATIC FUNCTION PANEL  URINALYSIS, ROUTINE W REFLEX MICROSCOPIC (NOT AT [facility])    Imaging Review No results found. I have personally reviewed and evaluated these images and lab results as part of my medical  decision-making.   EKG Interpretation None      MDM   Final diagnoses:  Failure to thrive in adult    82 year old female presents to the ED with rapid decline in cognitive function and mobility of the last 8-9 weeks. Per patient's family she was unable to walk this morning. Family contacted her primary care doctor as well as her neurologist who recommended that she come to the ED to have an MRI under anesthesia for further evaluation. Patient was supposed to have this MRI done yesterday but was unable to due to her being very combative. In the ED patient is disoriented, tremulous and is unable to follow commands. Lab work and urine within normal limits. Unfortunately, at [facility] they are unable to do MRI under sedation on the weekends and after hours. I spoke with Dr. Montez Moritaarter with the [facility] who will admit patient for observation status so that she may undergo this MRI under sedation. [facility]. Patient will be transferred to that facility. Discussed this plan with the patient's family who is agreeable.  Patient was discussed with and seen by Dr. Fayrene FearingJames who agrees with the treatment plan.      Lester KinsmanSamantha Tripp [place], PA-C 08/18/15 0023  Rolland PorterMark James, MD 08/25/15 [phone]

## 2015-08-17 NOTE — Progress Notes (Signed)
[facility] progress note. Chief complaint.Transfer note. History of presentation. This 82 year old female [facility] with failure to thrive, cognitive, cognitivechange, gait instability. The case was discussed with neurology who requested [facility] and [facility]. He patient has now arrived in tranfer and I'm seeing her at bedside to ensure she remains clinically stable and that her orders have transferred here appropriately. Physical exam. Vital signs. Temperature 98.5,pulse 70, respiration 19,156/79. General appearance. Frail elderly female. Cardiac. Irregularly irregular. Lungs. Clear Abdomen. Soft with positive bowel sounds. Neurologic.Cranial nerves II through XII. No unilateral or focal defects. Cognitively the patient is only oriented to self. Impressions/plan Problem #1. Failure to thrive,cognitive impairment, falls with gait instability. Neurology consult requested and I have text notified neurology of patient's arrival. Problem #2. Hypertension. Continue Cozaar. Problem #3. History atrial fib. Continue home meds. Problem #4.Hypothyroidism.Continue home meds. Patient appears clinically stable post transfer. All orders appear to have transferred here appropriately.

## 2015-08-17 NOTE — ED Notes (Signed)
Per EMS family concern related to pt decreased mobility and taking care of self; hx of dementia.

## 2015-08-18 ENCOUNTER — Observation Stay (HOSPITAL_COMMUNITY): Payer: Medicare Other

## 2015-08-18 ENCOUNTER — Encounter (HOSPITAL_COMMUNITY): Payer: Self-pay | Admitting: General Practice

## 2015-08-18 DIAGNOSIS — I481 Persistent atrial fibrillation: Secondary | ICD-10-CM | POA: Diagnosis not present

## 2015-08-18 DIAGNOSIS — I4891 Unspecified atrial fibrillation: Secondary | ICD-10-CM | POA: Diagnosis not present

## 2015-08-18 DIAGNOSIS — R627 Adult failure to thrive: Secondary | ICD-10-CM | POA: Diagnosis not present

## 2015-08-18 DIAGNOSIS — R531 Weakness: Secondary | ICD-10-CM | POA: Diagnosis not present

## 2015-08-18 DIAGNOSIS — R4189 Other symptoms and signs involving cognitive functions and awareness: Secondary | ICD-10-CM | POA: Diagnosis not present

## 2015-08-18 DIAGNOSIS — G2119 Other drug induced secondary parkinsonism: Secondary | ICD-10-CM | POA: Diagnosis not present

## 2015-08-18 DIAGNOSIS — E039 Hypothyroidism, unspecified: Secondary | ICD-10-CM

## 2015-08-18 LAB — T4, FREE: Free T4: 1.79 ng/dL — ABNORMAL HIGH (ref 0.61–1.12)

## 2015-08-18 LAB — VITAMIN B12: VITAMIN B 12: 202 pg/mL (ref 180–914)

## 2015-08-18 LAB — TSH: TSH: 1.1 u[IU]/mL (ref 0.350–4.500)

## 2015-08-18 MED ORDER — CLONAZEPAM 0.5 MG PO TABS: 0.2500 mg | ORAL_TABLET | Freq: Three times a day (TID) | ORAL | Status: DC | PRN

## 2015-08-18 MED ORDER — GADOBENATE DIMEGLUMINE 529 MG/ML IV SOLN
14.0000 mL | Freq: Once | INTRAVENOUS | Status: AC | PRN
  Administered 2015-08-18: 14 mL via INTRAVENOUS

## 2015-08-18 MED ORDER — PRO-STAT SUGAR FREE PO LIQD
30.0000 mL | Freq: Every day | ORAL | Status: DC
  Administered 2015-08-18: 30 mL via ORAL
  Filled 2015-08-18 (×2): qty 30

## 2015-08-18 MED ORDER — BOOST / RESOURCE BREEZE PO LIQD
1.0000 | Freq: Two times a day (BID) | ORAL | Status: DC
  Administered 2015-08-19: 1 via ORAL

## 2015-08-18 MED ORDER — LORAZEPAM 2 MG/ML IJ SOLN: 0.5000 mg | Freq: Once | INTRAMUSCULAR | Status: DC | PRN

## 2015-08-18 NOTE — Consult Note (Signed)
NEURO HOSPITALIST CONSULT NOTE   Requestig physician: Dr. Laural BenesJohnson  Reason for Consult: Progressive Parkinsonian symptoms  History obtained from:  Family and Chart     HPI:                                                                                                                                          Angela Blackburn is an 82 y.o. female who [facility] adult FTT, cognitive impairment, weakness and gait disturbance with falls. The weakness and gait problems have been progressive over the last several weeks. Also has had change in her demeanor and has stopped feeding herself. Has had some mild paranoid ideation. She has a resting tremor. Previous ED evaluation this month was suggestive of Parkinson's disease.   CT head on 6/6 revealed moderate diffuse atrophy, greatest in the frontal lobe regions bilaterally, stable relative to December 04, 2014. Small vessel disease throughout the centra semiovale bilaterally, was also stable. A prior infarct in the medial left temporal lobe, nearby small vessel disease throughout the anterior limb of the left external capsule, prior infarct at the gray -white junction in the medial mid- left frontal lobe, and old infarct at the gray-white junction of the medial right occipital lobe were all stable. Stable. Family had not been aware of the infarcts.   She had followed up with Neurology as an outpatient - according to the family, there was no formal diagnosis given to them for her symptoms, but dementia was mentioned. She was referred for outpatient MRI, which could not be obtained due to combativeness. She was advised to come to the ED for further evaluation. [facility] unable to perform [facility]. [facility] for [facility] under sedation.   Past Medical History  Diagnosis Date  . Chest pain     Hx  . Hypothyroidism   . Hyperlipidemia     no meds  . Knee pain   . Peripheral neuropathy (HCC)   . Fibromyalgia   .  PAF (paroxysmal atrial fibrillation) (HCC)     a. 06/2012 Flecainide and Xarelto started;  b. 06/2012 DCCV;  c. 06/2012 Syncope in setting of bradycardia->flecainide and dilt d/c'd->Multaq started.  . Syncope     a. 06/2012 - in setting of bradycardia following dccv->flecainide and dilt d/c'd.  . SVD (spontaneous vaginal delivery)   . COPD (chronic obstructive pulmonary disease) (HCC)     uses inhaler  . Arthritis     knees/hands  . Hypertension     no meds currently - Dr Melburn PopperNasher  . History of nuclear stress test     Myoview 11/16: EF 75%, no ischemia. Low Risk  . UTI (lower urinary tract infection)   . Dementia     Past Surgical History  Procedure Laterality Date  . US echocardiography  09/14/2007    EF 55-60%  . Cardiovascular stress test  08/29/2009    EF 86%  . Abdominal hysterectomy    . Joint replacement      Lt. TKA in 2010  . Cardioversion N/A 01/11/2013    Procedure: CARDIOVERSION;  Surgeon: Lewayne Bunting, MD;  Location: [facility] ENDOSCOPY;  Service: Cardiovascular;  Laterality: N/A;  . Cardioversion N/A 06/22/2013    Procedure: CARDIOVERSION;  Surgeon: Vesta Mixer, MD;  Location: [facility] ENDOSCOPY;  Service: Cardiovascular;  Laterality: N/A;  . Colonoscopy    . Anterior and posterior repair N/A 12/21/2013    Procedure: ANTERIOR (CYSTOCELE) AND POSTERIOR REPAIR (RECTOCELE);  Surgeon: Lavina Hamman, MD;  Location: [facility] ORS;  Service: Gynecology;  Laterality: N/A;    Family History  Problem Relation Age of Onset  . Heart failure Mother     Social History:  reports that she has never smoked. She has never used smokeless tobacco. She reports that she does not drink alcohol or use illicit drugs.  Allergies  Allergen Reactions  . Metoprolol Shortness Of Breath  . Crestor [Rosuvastatin Calcium] Other (See Comments)    myalgia  . Lipitor [Atorvastatin Calcium] Other (See Comments)    myalgia  . Livalo [Pitavastatin Calcium] Other (See Comments)    myalgia  . Other Other (See  Comments)    All CHOLESTEROL MEDS-cause myalgia  . Zetia [Ezetimibe] Other (See Comments)    myalgia  . Lisinopril Cough  . Penicillins     Rash Can tolerate Amoxicillin well. Has patient had a PCN reaction causing immediate rash, facial/tongue/throat swelling, SOB or lightheadedness with hypotension: Unknown Has patient had a PCN reaction causing severe rash involving mucus membranes or skin necrosis: No Has patient had a PCN reaction that required hospitalization No Has patient had a PCN reaction occurring within the last 10 years: No If all of the above answers are "NO", then may proceed with Cephalosporin use.    . Statins Other (See Comments)    myalgia  . Eliquis [Apixaban]     Rash.    MEDICATIONS:                                                                                                                     No current facility-administered medications on file prior to encounter.   Current Outpatient Prescriptions on File Prior to Encounter  Medication Sig Dispense Refill  . albuterol (PROAIR HFA) 108 (90 BASE) MCG/ACT inhaler Inhale 2 puffs into the lungs every 6 (six) hours as needed for shortness of breath.     . cephALEXin (KEFLEX) 250 MG capsule Take 250 mg by mouth daily.    [name] escitalopram (LEXAPRO) 20 MG tablet Take 20 mg by mouth daily.     [name] KLOR-CON M20 20 MEQ tablet TAKE 1 TABLET BY MOUTH EVERY DAY 30 tablet 11  . levothyroxine (SYNTHROID, LEVOTHROID) 137 MCG tablet Take 137 mcg by mouth every evening.     [name]  losartan (COZAAR) 25 MG tablet Take 25 mg by mouth daily.    . MULTAQ 400 MG tablet TAKE 1 TABLET (400 MG TOTAL) BY MOUTH 2 (TWO) TIMES DAILY WITH A MEAL. 60 tablet 11  . Rivaroxaban (XARELTO) 15 MG TABS tablet Take 1 tablet (15 mg total) by mouth daily with supper. 30 tablet 11  . temazepam (RESTORIL) 15 MG capsule Take 15 mg by mouth at bedtime as needed for sleep.      Current facility-administered medications:  .  acetaminophen (TYLENOL) tablet 650  mg, 650 mg, Oral, Q6H PRN **OR** acetaminophen (TYLENOL) suppository 650 mg, 650 mg, Rectal, Q6H PRN, Michael Litter, MD .  albuterol (PROVENTIL) (2.5 MG/3ML) 0.083% nebulizer solution 2.5 mg, 2.5 mg, Inhalation, Q6H PRN, Michael Litter, MD .  cephALEXin [facility]) capsule 250 mg, 250 mg, Oral, Daily, Michael Litter, MD, 250 mg at 08/18/15 1148 .  clonazePAM (KLONOPIN) tablet 0.5 mg, 0.5 mg, Oral, TID PRN, Michael Litter, MD, 0.5 mg at 08/18/15 0906 .  dronedarone (MULTAQ) tablet 400 mg, 400 mg, Oral, BID WC, Michael Litter, MD, 400 mg at 08/18/15 0656 .  escitalopram (LEXAPRO) tablet 5 mg, 5 mg, Oral, Daily, Michael Litter, MD, 5 mg at 08/18/15 0906 .  levothyroxine (SYNTHROID, LEVOTHROID) tablet 137 mcg, 137 mcg, Oral, Q supper, Michael Litter, MD .  LORazepam (ATIVAN) injection 0.5 mg, 0.5 mg, Intravenous, Once PRN, [place] Cyndie Mull, MD .  losartan (COZAAR) tablet 25 mg, 25 mg, Oral, Daily, Michael Litter, MD, 25 mg at 08/18/15 0906 .  ondansetron (ZOFRAN) tablet 4 mg, 4 mg, Oral, Q6H PRN **OR** ondansetron (ZOFRAN) injection 4 mg, 4 mg, Intravenous, Q6H PRN, Michael Litter, MD .  potassium chloride SA (K-DUR,KLOR-CON) CR tablet 20 mEq, 20 mEq, Oral, Daily, Michael Litter, MD, 20 mEq at 08/18/15 1148 .  Rivaroxaban (XARELTO) tablet 15 mg, 15 mg, Oral, Q supper, Michael Litter, MD .  sodium chloride flush (NS) 0.9 % injection 3 mL, 3 mL, Intravenous, Q12H, Michael Litter, MD, 3 mL at 08/18/15 0907 .  temazepam (RESTORIL) capsule 15 mg, 15 mg, Oral, QHS PRN, Michael Litter, MD    ROS:                                                                                                                                       History obtained from family  General ROS: negative for - chills or fever, night sweats, weight gain or weight loss Psychological ROS: as per HPI Neurological ROS: as noted in HPI  Blood pressure 158/75, pulse 76, temperature 97.7 F (36.5 C), temperature source Oral, resp. rate 18, height 5\' 7"  (1.702  m), weight 65.1 kg (143 lb 8.3 oz), SpO2 97 %.   Neurologic Examination:  HEENT-  Normocephalic, no lesions, without obvious abnormality.  Normal external eye and conjunctiva.   Lungs- No gross wheezes.  Extremities - Warm and well-perfused   Neurological Examination Mental Status: Awake and alert. Orientation and insight are poor. Tangential. Some degree of apathy noted. No expressive or receptive aphasia noted.  Cranial Nerves: II: Visual fields grossly normal, PERRL III,IV, VI: EOMI VII: smile symmetric VIII: intact to conversation IX,X: hypophonic speech XI: symmetric Motor: Right : Upper extremity   4+/5    Left:     Upper extremity   4+/5  Lower extremity   4+/5     Lower extremity   4+/5 Increased tone with mild cogwheeling bilateral upper extremities Sensory: Light touch intact throughout, with no extinction Deep Tendon Reflexes: Unremarkable Plantars: Right: downgoing   Left: downgoing Cerebellar: Slow FNF without ataxia. Mild resting tremor noted.  Gait: deferred   Lab Results: Basic Metabolic Panel:  Recent Labs Lab 08/17/15 1719  NA 137  K 4.1  CL 103  CO2 25  GLUCOSE 94  BUN 24*  CREATININE 0.97  CALCIUM 9.4    Liver Function Tests:  Recent Labs Lab 08/17/15 1719  AST 28  ALT 21  ALKPHOS 45  BILITOT 1.4*  PROT 7.5  ALBUMIN 4.0   No results for input(s): LIPASE, AMYLASE in the last 168 hours. No results for input(s): AMMONIA in the last 168 hours.  CBC:  Recent Labs Lab 08/17/15 1719  WBC 7.4  NEUTROABS 4.7  HGB 13.4  HCT 40.1  MCV 90.5  PLT 241    Cardiac Enzymes: No results for input(s): CKTOTAL, CKMB, CKMBINDEX, TROPONINI in the last 168 hours.  Lipid Panel: No results for input(s): CHOL, TRIG, HDL, CHOLHDL, VLDL, LDLCALC in the last 168 hours.  CBG: No results for input(s): GLUCAP in the last 168  hours.  Microbiology: Results for orders placed or performed during the hospital encounter of 06/24/13  MRSA PCR Screening     Status: None   Collection Time: 06/25/13  2:02 AM  Result Value Ref Range Status   MRSA by PCR NEGATIVE NEGATIVE Final    Comment:        The GeneXpert MRSA Assay (FDA approved for NASAL specimens only), is one component of a comprehensive MRSA colonization surveillance program. It is not intended to diagnose MRSA infection nor to guide or monitor treatment for MRSA infections.     Assessment:  1. Overall clinical picture most consistent with a degenerative dementia. May be a mixed dementia with features of a vascular dementia, a synucleinopathy (tremor, hypomimia, waxing and waning level of alertness/cognition, described symptom of retropulsion), and a tauopathy (prominent memory dysfunction). Paranoia regarding family and physician instructions for her care would be more likely to occur in a degenerative dementia than a vascular dementia.   2. MRI reveals no acute infarction. Old lacunar infarcts are noted. The pattern of atrophy is nonspecific, but prominent hippocampal atrophy increases the probability of Alzheimer dementia as the primary underlying etiology.  3. Paroxysmal atrial fibrillation. Anticoagulated with rivaroxaban. Also on amiodarone, which required discontinuation of her previous tricyclic antidepressant, amitryptiline, which had seemed to work well for her. Will defer to cardiology regarding this.  4. Depression. Treated with Lexapro. 5. Hypothyroidism. TSH normal. On levothyroxine.  6. Possible worsening of motor function after starting Lexapro. Literature search reveals that escitalopram (Lexapro) has been associated with drug-induced Parkinsonism. Extrapyramidal side effects SSRIs are thought to be secondary to the inhibitory effects of serotonin on dopamine neurotransmission  within the basal ganglion system which may alter function in the  striatum and induce a parkinsonian syndrome. Treatment of drug induced parkinsonism involves discontinuation of the offending drugs, which usually promotes remission of the parkinsonian syndrome within a short time.  Recommendations: 1. Consider discontinuation of Lexapro, if drug-induced Parkinsonism is suspected based upon the above discussion. If Lexapro is discontinued, a trial of A[place] (biperiden) can be considered, as one of the indications for this medication is drug-induced Parkinsonism. Precautions: do not use biperiden if the patient has have narrow-angle glaucoma, narrowing of the esophagus, stomach or bowel problems (eg, blockage, ulcerative colitis, megacolon), heart problems caused by bleeding, or difficulty urinating. 2. Consider paroxetine or venlafaxine as alternative antidepressants if stopping Lexapro.  3. Gradually taper the patient off Klonopin. Benzodiazepines are associated with worsened cognitive function in elderly patients, especially those with dementia. Should also avoid use of temazepam and lorazepam.    Electronically signed: Dr. Caryl Pina 08/18/2015, 7:31 AM

## 2015-08-18 NOTE — Progress Notes (Signed)
Initial Nutrition Assessment  DOCUMENTATION CODES:   Severe malnutrition in context of chronic illness  INTERVENTION:  Provide Boost Breeze po BID, each supplement provides 250 kcal and 9 grams of protein.  Provide 30 ml Prostat po once daily, each supplement provides 100 kcal and 15 grams of protein.   Encourage adequate PO intake.   NUTRITION DIAGNOSIS:   Malnutrition related to chronic illness as evidenced by percent weight loss, energy intake < or equal to 75% for > or equal to 1 month.  GOAL:   Patient will meet greater than or equal to 90% of their needs  MONITOR:   PO intake, Supplement acceptance, Weight trends, Labs, I & O's  REASON FOR ASSESSMENT:   Malnutrition Screening Tool    ASSESSMENT:   82 y.o. woman with a history of COPD, dementia, HTN, HLD, paroxysmal atrial fibrillation anticoagulated with Xarelto, and hypothyroidism who has had progressive weakness with associated gait disturbance and falls over the past several weeks. Presents with Adult failure to thrive, cognitive changes, weakness, falls.  Meal completion has been 25-50%. Pt with dementia and is somewhat confused at times. Family at bedside. They reports pt's appetite has been decreased over the past 2 and a half months. Daughter reports meal completion at home has been 50% at all 3 meals. Pt usually consumes Boost Shakes between meals, however reports she has gotten tired of them and has not consumes any the 1 week PTA. Pt with weight loss, per Epic weight records, pt with a 7.7% weight loss in 1 month. Pt is agreeable to Boost Breeze and Prostat to aid in caloric and protein needs. RD to order. Pt was encouraged to eat her food at meals.   Nutrition-Focused physical exam completed. Findings are no fat depletion, mild to moderate muscle depletion, and no edema. Depletion likely associated with the natural aging process.  Labs and medications reviewed.   Diet Order:  Diet Heart Room service  appropriate?: Yes; Fluid consistency:: Thin  Skin:  Reviewed, no issues  Last BM:  6/15  Height:   Ht Readings from Last 1 Encounters:  08/17/15 5\' 7"  (1.702 m)    Weight:   Wt Readings from Last 1 Encounters:  08/17/15 143 lb 8.3 oz (65.1 kg)    Ideal Body Weight:  61.36 kg  BMI:  Body mass index is 22.47 kg/(m^2).  Estimated Nutritional Needs:   Kcal:  1650-1850  Protein:  70-85 grams  Fluid:  1.6 - 1.8 L/day  EDUCATION NEEDS:   Education needs addressed  Roslyn SmilingStephanie Lismary Kiehn, MS, RD, LDN Pager # [phone] After hours/ weekend pager # [phone]

## 2015-08-18 NOTE — Progress Notes (Signed)
   08/17/15 2249  Vitals  Temp 98.5 F (36.9 C)  Temp Source Oral  BP (!) 156/79 mmHg  BP Location Left Arm  BP Method Automatic  Patient Position (if appropriate) Lying  Pulse Rate 70  Pulse Rate Source Dinamap  Resp 19  Oxygen Therapy  SpO2 97 %  O2 Device Room Air  Height and Weight  Weight 65.1 kg (143 lb 8.3 oz)  Type of Scale Used Bed  Type of Weight Actual  Admitted pt to rm 3E15 from [facility], pt alert but confused, oriented to room, call bell placed within reach, family at bedside.

## 2015-08-18 NOTE — Progress Notes (Signed)
PROGRESS NOTE    Angela Blackburn  ZOX:[id number]  DOB: [birth year]  DOA: 08/17/2015 PCP: Eartha Inch, MD Outpatient Specialists:   Hospital course: HPI: Angela Blackburn is a 82 y.o. woman with a history of HTN, HLD, paroxysmal atrial fibrillation anticoagulated with Xarelto, and hypothyroidism who has had progressive weakness with associated gait disturbance and falls over the past several weeks. She has also had a resting tremor, change in her demeanor, and has stopped feeding herself. She was evaluated in the ED earlier this month, and a diagnosis of Parkinson's disease was suggested. Of note, CT of the head showed prior infarcts, which the family had not been aware of. She followed up with neurology as an outpatient as recommended. Per the family, there has not been a formal diagnosis for her symptoms, but dementia was also mentioned. She was referred for outpatient MRI, which did not happen due to combativeness/patietn noncompliance. She was advised to come to the ED for further evaluation.  The family feels that the patient has declined even over the past 24 hours. She is requiring two person assistance with ambulation, which is new. She is not eating well, and requires assistance with meals. No pain complaints. No dysuria. No bites. No rashes.  Daughter admits that symptoms have been worse since starting lexapro several weeks ago. Apparently, there was an initial concern for depression. Lexapro is being tapered off; the patient has two doses left.  Also, the patient is now on restoril for insomnia AND klonopin prn for anxiety. These medications are also new in the past 3-4 weeks.   Assessment & Plan:   Adult failure to thrive with new cognitive impairment, falls, weakness, and gait disturbance --Neurology consult requested --MRI reviewed, no acute findings --Following TSH, free T4, B12, and folate levels --Weaning from lexapro and benzodiazepines --Consider  alternatives to benzodiazepines as well  HTN --Continue Cozaar  History of atrial fibrillation --Continue Multaq, Xarelto  Hypothyroidism --Continue home dose of levothyroxine for now  Microcopic hematuria noted on U/A but it was a cath specimen.  Renal US negative.    DVT prophylaxis: Anticoagulated with Xarelto Code Status: FULL Family Communication: Husband and daughter present in the ED at time of admission Disposition Plan: To be determined; family thinks she will need SNF Consults called: Neurology, Dr. Desmond Lope Admission status: Observation, telemetry  Consultants:  neurology  Procedures:  MRI  Renal US  Subjective: Pt is a little more alert per family.     Objective: Filed Vitals:   08/17/15 2249 08/18/15 0646 08/18/15 0835 08/18/15 1100  BP: 156/79 158/75 168/72 157/73  Pulse: 70 76  73  Temp: 98.5 F (36.9 C) 97.7 F (36.5 C) 97.9 F (36.6 C) 97.9 F (36.6 C)  TempSrc: Oral Oral    Resp: Height:  (1.702 m)     Weight: 143 lb 8.3 oz (65.1 kg)     SpO2: 97% 97% 96% 97%    Intake/Output Summary (Last 24 hours) at 08/18/15 1543 Last data filed at 08/18/15 1408  Gross per 24 hour  Intake    440 ml  Output     50 ml  Net    390 ml   Filed Weights   08/17/15 2249  Weight: 143 lb 8.3 oz (65.1 kg)    Exam:  Eyes: PERRL, lids and conjunctivae normal ENMT: Mucous membranes are moist. Posterior pharynx clear of any exudate or lesions.Normal dentition.  Neck: normal, supple Respiratory: clear to auscultation bilaterally,  no wheezing, no crackles. Normal respiratory effort. No accessory muscle use.  Cardiovascular: Irregular but rate controlled. No extremity edema. 2+ pedal pulses. Abdomen: no tenderness, no distention, no masses palpated. Bowel sounds positive.  Musculoskeletal: no clubbing / cyanosis. No joint deformity upper and lower extremities. Good ROM, no contractures. Normal muscle tone.  Skin: no rashes, bruising noted,  warm and dry Neurologic: CN 2-12 grossly intact. Sensation intact, generalized weakness but symmetric, coordination intact. Speech is slow but not slurred. Response times are delayed. Psychiatric: alert and oriented but flat affect, judgement and insight are impaired  Data Reviewed: Basic Metabolic Panel:  Recent Labs Lab 08/17/15 1719  NA 137  K 4.1  CL 103  CO2 25  GLUCOSE 94  BUN 24*  CREATININE 0.97  CALCIUM 9.4   Liver Function Tests:  Recent Labs Lab 08/17/15 1719  AST 28  ALT 21  ALKPHOS 45  BILITOT 1.4*  PROT 7.5  ALBUMIN 4.0   No results for input(s): LIPASE, AMYLASE in the last 168 hours. No results for input(s): AMMONIA in the last 168 hours. CBC:  Recent Labs Lab 08/17/15 1719  WBC 7.4  NEUTROABS 4.7  HGB 13.4  HCT 40.1  MCV 90.5  PLT 241   Cardiac Enzymes: No results for input(s): CKTOTAL, CKMB, CKMBINDEX, TROPONINI in the last 168 hours. BNP (last 3 results) No results for input(s): PROBNP in the last 8760 hours. CBG: No results for input(s): GLUCAP in the last 168 hours.  No results found for this or any previous visit (from the past 240 hour(s)).   Studies: Mr Lodema Pilot Contrast  2015/09/13  CLINICAL DATA:  Dementia, failure to thrive, progressive weakness, gait disturbance, multiple falls. EXAM: MRI HEAD WITHOUT AND WITH CONTRAST TECHNIQUE: Multiplanar, multiecho pulse sequences of the brain and surrounding structures were obtained without and with intravenous contrast. CONTRAST:  14mL MULTIHANCE GADOBENATE DIMEGLUMINE 529 MG/ML IV SOLN COMPARISON:  CT head 08/07/2015. FINDINGS: No evidence for acute infarction, hemorrhage, mass lesion, hydrocephalus, or extra-axial fluid. Global atrophy. Extensive T2 and FLAIR hyperintensities throughout the white matter, consistent with chronic microvascular ischemic change. Cortical volume loss favored to represent diffuse atrophy, although some areas of chronic subcortical white matter infarction are  observed, most notable in the LEFT frontal region. Post infusion, no abnormal enhancement of the brain or meninges. Flow voids are maintained. No parenchymal foci of chronic hemorrhage. Pituitary is mildly prominent, but not definitely enlarged. No cerebellar tonsillar herniation. Upper cervical region shows no osseous lesions or cord compression. Extracranial soft tissues are unremarkable. IMPRESSION: Advanced atrophy. Extensive white matter disease. Scattered areas of chronic ischemia. No acute stroke or hemorrhage. No abnormal postcontrast enhancement. Electronically Signed   By: Elsie Stain M.D.   On: 2015-09-13 11:27   US Renal  09/13/15  CLINICAL DATA:  Microscopic hematuria. EXAM: RENAL / URINARY TRACT ULTRASOUND COMPLETE COMPARISON:  CT of the abdomen pelvis 07/11/2014 FINDINGS: Right Kidney: Length: 9.8 cm. Echogenicity within normal limits. No mass or hydronephrosis visualized. Left Kidney: Length: 9.9 cm. Echogenicity within normal limits. No mass or hydronephrosis visualized. Bladder: Appears normal for degree of bladder distention. IMPRESSION: Normal renal ultrasound. Electronically Signed   By: Ted Mcalpine M.D.   On: 2015-09-13 12:18     Scheduled Meds: . cephALEXin  250 mg Oral Daily  . dronedarone  400 mg Oral BID WC  . escitalopram  5 mg Oral Daily  . levothyroxine  137 mcg Oral Q supper  . losartan  25 mg Oral  Daily  . potassium chloride SA  20 mEq Oral Daily  . Rivaroxaban  15 mg Oral Q supper  . sodium chloride flush  3 mL Intravenous Q12H   Continuous Infusions:   Principal Problem:   Weakness Active Problems:   HTN (hypertension)   Atrial fibrillation (HCC)   Failure to thrive in adult   Falls   Cognitive impairment   Hypothyroidism   Standley Dakinslanford Johnson, MD, FAAFP [facility] Pager [phone]  If 7PM-7AM, please contact night-coverage [email or web address] Password [email or web address] 08/18/2015, 3:43 PM

## 2015-08-19 DIAGNOSIS — R627 Adult failure to thrive: Secondary | ICD-10-CM | POA: Diagnosis not present

## 2015-08-19 DIAGNOSIS — R4189 Other symptoms and signs involving cognitive functions and awareness: Secondary | ICD-10-CM | POA: Diagnosis not present

## 2015-08-19 DIAGNOSIS — I4891 Unspecified atrial fibrillation: Secondary | ICD-10-CM | POA: Diagnosis not present

## 2015-08-19 DIAGNOSIS — R531 Weakness: Secondary | ICD-10-CM | POA: Diagnosis not present

## 2015-08-19 DIAGNOSIS — E43 Unspecified severe protein-calorie malnutrition: Secondary | ICD-10-CM | POA: Insufficient documentation

## 2015-08-19 NOTE — [place] FL2 (Signed)
[place] MEDICAID FL2 LEVEL OF CARE SCREENING TOOL     IDENTIFICATION  Patient Name: Angela Blackburn Birthdate: 11/28/1932 Sex: female Admission Date (Current Location): 08/17/2015  County and Medicaid Number:  [place]   Facility and Address:  [facility], [address], [place], [place] [address]      Provider Number: [id number]  Attending Physician Name and Address:  [name], MD  Relative Name and Phone Number:       Current Level of Care: SNF Recommended Level of Care: Skilled Nursing Facility Prior Approval Number:    Date Approved/Denied:   PASRR Number: [id number]  Discharge Plan: SNF    Current Diagnoses: Patient Active Problem List   Diagnosis Date Noted  . Protein-calorie malnutrition, severe 08/19/2015  . Failure to thrive in adult 08/17/2015  . Weakness 08/17/2015  . Falls 08/17/2015  . Cognitive impairment 08/17/2015  . Hypothyroidism 08/17/2015  . Cystocele with rectocele 12/21/2013  . Syncope   . PAF (paroxysmal atrial fibrillation) (HCC)   . Syncope and collapse 06/25/2013  . Bradycardia 06/25/2013  . Hyponatremia 12/09/2012  . Dizziness 12/09/2012  . Hypokalemia 12/09/2012  . Atrial fibrillation (HCC) 12/09/2012  . Chest tightness 12/09/2011  . Edema 10/06/2011  . HTN (hypertension) 12/11/2010  . Hypercholesterolemia 12/11/2010  . Dyspnea 12/11/2010    Orientation RESPIRATION BLADDER Height & Weight     Self  Normal Continent Weight: 140 lb 14 oz (63.9 kg) (bedscale) Height:  5' 7" (170.2 cm)  BEHAVIORAL SYMPTOMS/MOOD NEUROLOGICAL BOWEL NUTRITION STATUS      Continent  (heart healthy)  AMBULATORY STATUS COMMUNICATION OF NEEDS Skin   Extensive Assist Verbally Normal                       Personal Care Assistance Level of Assistance  Bathing, Dressing Bathing Assistance: Limited assistance   Dressing Assistance: Limited assistance     Functional Limitations Info  Sight, Hearing, Speech  Sight Info: Adequate Hearing Info: Adequate Speech Info: Adequate    SPECIAL CARE FACTORS FREQUENCY  PT (By licensed PT)     PT Frequency:  (5x/week)              Contractures Contractures Info: Not present    Additional Factors Info  Allergies, Code Status Code Status Info: Full Allergies Info:  (Metoprolol, Crestor, Other (all cholesterol medications cause Myalgia) Lipitor, Livalo, Zetia, Statins, Lisinprol, Penicillins, Eliquis)           Current Medications (08/19/2015):  This is the current hospital active medication list Current Facility-Administered Medications  Medication Dose Route Frequency Provider Last Rate Last Dose  . acetaminophen (TYLENOL) tablet 650 mg  650 mg Oral Q6H PRN [name], MD       Or  . acetaminophen (TYLENOL) suppository 650 mg  650 mg Rectal Q6H PRN [name], MD      . albuterol (PROVENTIL) (2.5 MG/3ML) 0.083% nebulizer solution 2.5 mg  2.5 mg Inhalation Q6H PRN [name], MD      . cephALEXin (KEFLEX) capsule 250 mg  250 mg Oral Daily [name], MD   250 mg at 08/19/15 0912  . clonazePAM (KLONOPIN) tablet 0.25 mg  0.25 mg Oral TID PRN [name], MD      . dronedarone (MULTAQ) tablet 400 mg  400 mg Oral BID WC [name], MD   400 mg at 08/19/15 0912  . feeding supplement (BOOST / RESOURCE BREEZE) liquid 1 Container    1 Container Oral BID BM [name], MD   1 Container at 08/19/15 0920  . feeding supplement (PRO-STAT SUGAR FREE 64) liquid 30 mL  30 mL Oral Q1500 [name], MD   30 mL at 08/18/15 1641  . levothyroxine (SYNTHROID, LEVOTHROID) tablet 137 mcg  137 mcg Oral Q supper [name], MD   137 mcg at 08/18/15 1637  . losartan (COZAAR) tablet 25 mg  25 mg Oral Daily [name], MD   25 mg at 08/19/15 0912  . ondansetron (ZOFRAN) tablet 4 mg  4 mg Oral Q6H PRN [name], MD       Or  . ondansetron [facility](ZOFRAN) injection 4 mg  4 mg Intravenous Q6H PRN [name], MD      . potassium chloride SA  (K-DUR,KLOR-CON) CR tablet 20 mEq  20 mEq Oral Daily [name], MD   20 mEq at 08/19/15 0912  . Rivaroxaban (XARELTO) tablet 15 mg  15 mg Oral Q supper [name], MD   15 mg at 08/18/15 1637  . sodium chloride flush (NS) 0.9 % injection 3 mL  3 mL Intravenous Q12H [name], MD   3 mL at 08/19/15 0920     Discharge Medications: Please see discharge summary for a list of discharge medications.  Relevant Imaging Results:  Relevant Lab Results:   Additional Information    Ria Bushyiesha K Makina Skow, LCSW

## 2015-08-19 NOTE — Discharge Instructions (Signed)

## 2015-08-19 NOTE — Progress Notes (Addendum)
PROGRESS NOTE    Angela ROHLMAN  UEA:[id number]  DOB: 08-Aug-1932  DOA: 08/17/2015 PCP: Eartha Inch, MD Outpatient Specialists:   Hospital course: HPI: Angela Blackburn is a 82 y.o. woman with a history of HTN, HLD, paroxysmal atrial fibrillation anticoagulated with Xarelto, and hypothyroidism who has had progressive weakness with associated gait disturbance and falls over the past several weeks. She has also had a resting tremor, change in her demeanor, and has stopped feeding herself. She was evaluated in the ED earlier this month, and a diagnosis of Parkinson's disease was suggested. Of note, CT of the head showed prior infarcts, which the family had not been aware of. She followed up with neurology as an outpatient as recommended. Per the family, there has not been a formal diagnosis for her symptoms, but dementia was also mentioned. She was referred for outpatient MRI, which did not happen due to combativeness/patietn noncompliance. She was advised to come to the ED for further evaluation.  The family feels that the patient has declined even over the past 24 hours. She is requiring two person assistance with ambulation, which is new. She is not eating well, and requires assistance with meals. No pain complaints. No dysuria. No bites. No rashes.  Daughter admits that symptoms have been worse since starting lexapro several weeks ago. Apparently, there was an initial concern for depression. Lexapro is being tapered off; the patient has two doses left.  Also, the patient was on restoril for insomnia AND klonopin prn for anxiety. These medications were new in the past 3-4 weeks.   Assessment & Plan:   Adult failure to thrive with new cognitive impairment, falls, weakness, and gait disturbance - this most likely is dementia, complicated by medications which we are working to get her off of.  --Neurology consult appreciated, weaning off lexapro now, final dose given, family  doesn't want to start paroxetine at this time --MRI reviewed, discussed with family.  --Weaning from benzodiazepines (reduced clonazepam dose by 50%), hopefully discontinue it tomorrow.  - Family requesting placement, I spoke with case Location manager this morning.  - Family concerned that patient showing some signs of delirium in hospital, they plan to stay at bedside to help orient patient  HTN --Continue Cozaar, seems fairly well controlled  History of atrial fibrillation --Continue Multaq, Xarelto  Hypothyroidism --TSH ok, continue home dose of levothyroxine for now  Microcopic hematuria noted on U/A but it was a cath specimen.  Renal US negative.   Severe protein calorie malnutrition - appreciate dietician recommendations for nutrition   DVT prophylaxis: Anticoagulated with Xarelto Code Status: FULL Family Communication: Husband and daughter present in the ED at time of admission Disposition Plan: SNF Consults called: Neurology, Dr. Desmond Lope Admission status: Observation  Consultants:  neurology  Procedures:  MRI  Renal US  Subjective: Pt having some confusion but otherwise has been stable.     Objective: Filed Vitals:   08/18/15 1930 08/19/15 0002 08/19/15 0703 08/19/15 1137  BP: 144/83 135/76 174/82 144/68  Pulse: 82 60 72 64  Temp: 99.2 F (37.3 C) 97.6 F (36.4 C) 97.5 F (36.4 C) 97.2 F (36.2 C)  TempSrc: Oral Oral Oral Oral  Resp: Height:      Weight:   140 lb 14 oz (63.9 kg)   SpO2: 98% 98% 97% 98%    Intake/Output Summary (Last 24 hours) at 08/19/15 1209 Last data filed at 08/19/15 0700  Gross per 24 hour  Intake    520 ml  Output    400 ml  Net    120 ml   Filed Weights   08/17/15 2249 08/19/15 0703  Weight: 143 lb 8.3 oz (65.1 kg) 140 lb 14 oz (63.9 kg)    Exam:  Eyes: PERRL, lids and conjunctivae normal ENMT: Mucous membranes are moist. Posterior pharynx clear of any exudate or lesions.Normal dentition.   Neck: normal, supple Respiratory: clear to auscultation bilaterally, no wheezing, no crackles. Normal respiratory effort. No accessory muscle use.  Cardiovascular: Irregular but rate controlled. No extremity edema. 2+ pedal pulses. Abdomen: no tenderness, no distention, no masses palpated. Bowel sounds positive.  Musculoskeletal: no clubbing / cyanosis. No joint deformity upper and lower extremities. Good ROM, no contractures. Normal muscle tone.  Skin: no rashes, bruising noted, warm and dry Neurologic: CN 2-12 grossly intact. Sensation intact, generalized weakness but symmetric, coordination intact. Speech is slow but not slurred. Response times are delayed. Psychiatric: alert and oriented but flat affect, judgement and insight are impaired  Data Reviewed: Basic Metabolic Panel:  Recent Labs Lab 08/17/15 1719  NA 137  K 4.1  CL 103  CO2 25  GLUCOSE 94  BUN 24*  CREATININE 0.97  CALCIUM 9.4   Liver Function Tests:  Recent Labs Lab 08/17/15 1719  AST 28  ALT 21  ALKPHOS 45  BILITOT 1.4*  PROT 7.5  ALBUMIN 4.0   No results for input(s): LIPASE, AMYLASE in the last 168 hours. No results for input(s): AMMONIA in the last 168 hours. CBC:  Recent Labs Lab 08/17/15 1719  WBC 7.4  NEUTROABS 4.7  HGB 13.4  HCT 40.1  MCV 90.5  PLT 241   Cardiac Enzymes: No results for input(s): CKTOTAL, CKMB, CKMBINDEX, TROPONINI in the last 168 hours. BNP (last 3 results) No results for input(s): PROBNP in the last 8760 hours. CBG: No results for input(s): GLUCAP in the last 168 hours.  No results found for this or any previous visit (from the past 240 hour(s)).   Studies: Mr Lodema Pilot Contrast  2015/09/15  CLINICAL DATA:  Dementia, failure to thrive, progressive weakness, gait disturbance, multiple falls. EXAM: MRI HEAD WITHOUT AND WITH CONTRAST TECHNIQUE: Multiplanar, multiecho pulse sequences of the brain and surrounding structures were obtained without and with  intravenous contrast. CONTRAST:  14mL MULTIHANCE GADOBENATE DIMEGLUMINE 529 MG/ML IV SOLN COMPARISON:  CT head 08/07/2015. FINDINGS: No evidence for acute infarction, hemorrhage, mass lesion, hydrocephalus, or extra-axial fluid. Global atrophy. Extensive T2 and FLAIR hyperintensities throughout the white matter, consistent with chronic microvascular ischemic change. Cortical volume loss favored to represent diffuse atrophy, although some areas of chronic subcortical white matter infarction are observed, most notable in the LEFT frontal region. Post infusion, no abnormal enhancement of the brain or meninges. Flow voids are maintained. No parenchymal foci of chronic hemorrhage. Pituitary is mildly prominent, but not definitely enlarged. No cerebellar tonsillar herniation. Upper cervical region shows no osseous lesions or cord compression. Extracranial soft tissues are unremarkable. IMPRESSION: Advanced atrophy. Extensive white matter disease. Scattered areas of chronic ischemia. No acute stroke or hemorrhage. No abnormal postcontrast enhancement. Electronically Signed   By: Elsie Stain M.D.   On: Sep 15, 2015 11:27   US Renal  2015/09/15  CLINICAL DATA:  Microscopic hematuria. EXAM: RENAL / URINARY TRACT ULTRASOUND COMPLETE COMPARISON:  CT of the abdomen pelvis 07/11/2014 FINDINGS: Right Kidney: Length: 9.8 cm. Echogenicity within normal limits. No mass or hydronephrosis visualized. Left Kidney: Length: 9.9 cm. Echogenicity within normal  limits. No mass or hydronephrosis visualized. Bladder: Appears normal for degree of bladder distention. IMPRESSION: Normal renal ultrasound. Electronically Signed   By: Ted Mcalpineobrinka  Dimitrova M.D.   On: 08/18/2015 12:18     Scheduled Meds: . cephALEXin  250 mg Oral Daily  . dronedarone  400 mg Oral BID WC  . feeding supplement  1 Container Oral BID BM  . feeding supplement (PRO-STAT SUGAR FREE 64)  30 mL Oral Q1500  . levothyroxine  137 mcg Oral Q supper  . losartan  25 mg  Oral Daily  . potassium chloride SA  20 mEq Oral Daily  . Rivaroxaban  15 mg Oral Q supper  . sodium chloride flush  3 mL Intravenous Q12H   Continuous Infusions:   Principal Problem:   Weakness Active Problems:   HTN (hypertension)   Atrial fibrillation (HCC)   Failure to thrive in adult   Falls   Cognitive impairment   Hypothyroidism   Protein-calorie malnutrition, severe   Standley Dakinslanford Geena Weinhold, MD, FAAFP [facility] Pager [phone]  If 7PM-7AM, please contact night-coverage [email or web address] Password [email or web address] 08/19/2015, 12:09 PM

## 2015-08-19 NOTE — [place] FL2 (Deleted)
[place] MEDICAID FL2 LEVEL OF CARE SCREENING TOOL     IDENTIFICATION  Patient Name: Angela Blackburn Birthdate: [birth year] Sex: female Admission Date (Current Location): 08/17/2015  [facility] and [place] Number:  Producer, television/film/video and Address:  The . [facility], [address], [place], [place] [address]      Provider Number: [id number]  Attending Physician Name and Address:  Cleora Fleet, MD  Relative Name and Phone Number:       Current Level of Care: SNF Recommended Level of Care: Skilled Nursing Facility Prior Approval Number:    Date Approved/Denied:   PASRR Number: [id number]  Discharge Plan: SNF    Current Diagnoses: Patient Active Problem List   Diagnosis Date Noted  . Protein-calorie malnutrition, severe 08/19/2015  . Failure to thrive in adult 08/17/2015  . Weakness 08/17/2015  . Falls 08/17/2015  . Cognitive impairment 08/17/2015  . Hypothyroidism 08/17/2015  . Cystocele with rectocele 12/21/2013  . Syncope   . PAF (paroxysmal atrial fibrillation) (HCC)   . Syncope and collapse 06/25/2013  . Bradycardia 06/25/2013  . Hyponatremia 12/09/2012  . Dizziness 12/09/2012  . Hypokalemia 12/09/2012  . Atrial fibrillation (HCC) 12/09/2012  . Chest tightness 12/09/2011  . Edema 10/06/2011  . HTN (hypertension) 12/11/2010  . Hypercholesterolemia 12/11/2010  . Dyspnea 12/11/2010    Orientation RESPIRATION BLADDER Height & Weight     Self  Normal Continent Weight: 140 lb 14 oz (63.9 kg) (bedscale) Height:   (170.2 cm)  BEHAVIORAL SYMPTOMS/MOOD NEUROLOGICAL BOWEL NUTRITION STATUS      Continent  (heart healthy)  AMBULATORY STATUS COMMUNICATION OF NEEDS Skin   Extensive Assist Verbally Normal                       Personal Care Assistance Level of Assistance  Bathing, Dressing Bathing Assistance: Limited assistance   Dressing Assistance: Limited assistance     Functional Limitations Info  Sight, Hearing, Speech  Sight Info: Adequate Hearing Info: Adequate Speech Info: Adequate    SPECIAL CARE FACTORS FREQUENCY  PT (By licensed PT)     PT Frequency:  (5x/week)              Contractures Contractures Info: Not present    Additional Factors Info  Allergies, Code Status Code Status Info: Full Allergies Info:  (Metoprolol, Crestor, Other (all cholesterol medications cause Myalgia) Lipitor, Livalo, Zetia, Statins, Lisinprol, Penicillins, Eliquis)           Current Medications (08/19/2015):  This is the current hospital active medication list Current Facility-Administered Medications  Medication Dose Route Frequency Provider Last Rate Last Dose  . acetaminophen (TYLENOL) tablet 650 mg  650 mg Oral Q6H PRN Michael Litter, MD       Or  . acetaminophen (TYLENOL) suppository 650 mg  650 mg Rectal Q6H PRN Michael Litter, MD      . albuterol (PROVENTIL) (2.5 MG/3ML) 0.083% nebulizer solution 2.5 mg  2.5 mg Inhalation Q6H PRN Michael Litter, MD      . cephALEXin [facility]) capsule 250 mg  250 mg Oral Daily Michael Litter, MD   250 mg at 08/19/15 0912  . clonazePAM (KLONOPIN) tablet 0.25 mg  0.25 mg Oral TID PRN Clanford Cyndie Mull, MD      . dronedarone (MULTAQ) tablet 400 mg  400 mg Oral BID WC Michael Litter, MD   400 mg at 08/19/15 0912  . feeding supplement (BOOST / RESOURCE BREEZE) liquid 1 Container  1 Container Oral BID BM [name], MD   1 Container at 08/19/15 0920  . feeding supplement (PRO-STAT SUGAR FREE 64) liquid 30 mL  30 mL Oral Q1500 [name], MD   30 mL at 08/18/15 1641  . levothyroxine (SYNTHROID, LEVOTHROID) tablet 137 mcg  137 mcg Oral Q supper [name], MD   137 mcg at 08/18/15 1637  . losartan (COZAAR) tablet 25 mg  25 mg Oral Daily [name], MD   25 mg at 08/19/15 0912  . ondansetron (ZOFRAN) tablet 4 mg  4 mg Oral Q6H PRN [name], MD       Or  . ondansetron [facility](ZOFRAN) injection 4 mg  4 mg Intravenous Q6H PRN [name], MD      . potassium chloride SA  (K-DUR,KLOR-CON) CR tablet 20 mEq  20 mEq Oral Daily [name], MD   20 mEq at 08/19/15 0912  . Rivaroxaban (XARELTO) tablet 15 mg  15 mg Oral Q supper [name], MD   15 mg at 08/18/15 1637  . sodium chloride flush (NS) 0.9 % injection 3 mL  3 mL Intravenous Q12H [name], MD   3 mL at 08/19/15 0920     Discharge Medications: Please see discharge summary for a list of discharge medications.  Relevant Imaging Results:  Relevant Lab Results:   Additional Information    Ria Bushyiesha K Marc Leichter, LCSW

## 2015-08-19 NOTE — Clinical Social Work Note (Signed)
Clinical Social Work Assessment  Patient Details  Name: Angela Blackburn MRN: [id number] Date of Birth: [birth year]  Date of referral:  08/19/15               Reason for consult:  Discharge Planning, Facility Placement                Permission sought to share information with:  Facility Sport and exercise psychologist, Family Supports Permission granted to share information::  Yes, Verbal Permission Granted  Name::     Angela Blackburn  Agency::     Relationship::  Daughter  Contact Information:     Housing/Transportation Living arrangements for the past 2 months:  Single Family Home Source of Information:  Spouse, Adult Children Patient Interpreter Needed:  None Criminal Activity/Legal Involvement Pertinent to Current Situation/Hospitalization:  No - Comment as needed Significant Relationships:  Adult Children, Spouse Lives with:  Spouse Do you feel safe going back to the place where you live?  Yes Need for family participation in patient care:  Yes (Comment)  Care giving concerns:  The patient's daughter and husband report prolonged weakness for the patient at home. Both state that the patient's care needs currently exceed their capabilities.   Social Worker assessment / plan:  CSW met with the patient and family (daughter and husband) at bedside to complete assessment. The patient is from home where she lives with her husband.The patient and family are agreeable to SNF placement at time of discharge and have a preference for [facility]. Their second preference is [facility]. CSW explained SNF search/placement process and answered the patient and family's questions. The family would like to be able to bring the patient home after rehab if able, but if the patient does not improve they will plan to utilize their long term care policy. CSW will followup with bed offers when available.   Employment status:  Disabled (Comment on whether or not currently receiving Disability) Insurance  information:  Managed Medicare PT Recommendations:  Not assessed at this time Information / Referral to community resources:  [place]  Patient/Family's Response to care:  The patient is and family are happy with the care the patient is receiving and appreciative of CSW's assistance with placement.  Patient/Family's Understanding of and Emotional Response to Diagnosis, Current Treatment, and Prognosis:  The patient and family appear to have a good understanding of the reason for the patient's admission and post DC needs (rehab).  Emotional Assessment Appearance:  Appears stated age Attitude/Demeanor/Rapport:  Unable to Assess Affect (typically observed):  Unable to Assess Orientation:  Oriented to Self Alcohol / Substance use:  Not Applicable Psych involvement (Current and /or in the community):  No (Comment)  Discharge Needs  Concerns to be addressed:  Discharge Planning Concerns Readmission within the last 30 days:  No Current discharge risk:  Chronically ill, Physical Impairment Barriers to Discharge:  Continued Medical Work up   Rigoberto Noel, LCSW 08/19/2015, 3:33 PM

## 2015-08-19 NOTE — NC FL2 (Deleted)
[place] MEDICAID FL2 LEVEL OF CARE SCREENING TOOL     IDENTIFICATION  Patient Name: Angela Blackburn Birthdate: 09/25/1932 Sex: female Admission Date (Current Location): 08/17/2015  County and Medicaid Number:  [place]   Facility and Address:  [facility], [address], [place], [place] [address]      Provider Number: [id number]  Attending Physician Name and Address:  [name], MD  Relative Name and Phone Number:       Current Level of Care: SNF Recommended Level of Care: Skilled Nursing Facility Prior Approval Number:    Date Approved/Denied:   PASRR Number: [id number]  Discharge Plan: SNF    Current Diagnoses: Patient Active Problem List   Diagnosis Date Noted  . Protein-calorie malnutrition, severe 08/19/2015  . Failure to thrive in adult 08/17/2015  . Weakness 08/17/2015  . Falls 08/17/2015  . Cognitive impairment 08/17/2015  . Hypothyroidism 08/17/2015  . Cystocele with rectocele 12/21/2013  . Syncope   . PAF (paroxysmal atrial fibrillation) (HCC)   . Syncope and collapse 06/25/2013  . Bradycardia 06/25/2013  . Hyponatremia 12/09/2012  . Dizziness 12/09/2012  . Hypokalemia 12/09/2012  . Atrial fibrillation (HCC) 12/09/2012  . Chest tightness 12/09/2011  . Edema 10/06/2011  . HTN (hypertension) 12/11/2010  . Hypercholesterolemia 12/11/2010  . Dyspnea 12/11/2010    Orientation RESPIRATION BLADDER Height & Weight     Self  Normal Continent Weight: 140 lb 14 oz (63.9 kg) (bedscale) Height:  5' 7" (170.2 cm)  BEHAVIORAL SYMPTOMS/MOOD NEUROLOGICAL BOWEL NUTRITION STATUS      Continent  (heart healthy)  AMBULATORY STATUS COMMUNICATION OF NEEDS Skin   Extensive Assist Verbally Normal                       Personal Care Assistance Level of Assistance  Bathing, Dressing Bathing Assistance: Limited assistance   Dressing Assistance: Limited assistance     Functional Limitations Info  Sight, Hearing, Speech  Sight Info: Adequate Hearing Info: Adequate Speech Info: Adequate    SPECIAL CARE FACTORS FREQUENCY  PT (By licensed PT)     PT Frequency:  (5x/week)              Contractures Contractures Info: Not present    Additional Factors Info  Allergies, Code Status Code Status Info: Full Allergies Info:  (Metoprolol, Crestor, Other (all cholesterol medications cause Myalgia) Lipitor, Livalo, Zetia, Statins, Lisinprol, Penicillins, Eliquis)           Current Medications (08/19/2015):  This is the current hospital active medication list Current Facility-Administered Medications  Medication Dose Route Frequency Provider Last Rate Last Dose  . acetaminophen (TYLENOL) tablet 650 mg  650 mg Oral Q6H PRN [name], MD       Or  . acetaminophen (TYLENOL) suppository 650 mg  650 mg Rectal Q6H PRN [name], MD      . albuterol (PROVENTIL) (2.5 MG/3ML) 0.083% nebulizer solution 2.5 mg  2.5 mg Inhalation Q6H PRN [name], MD      . cephALEXin (KEFLEX) capsule 250 mg  250 mg Oral Daily [name], MD   250 mg at 08/19/15 0912  . clonazePAM (KLONOPIN) tablet 0.25 mg  0.25 mg Oral TID PRN [name], MD      . dronedarone (MULTAQ) tablet 400 mg  400 mg Oral BID WC [name], MD   400 mg at 08/19/15 0912  . feeding supplement (BOOST / RESOURCE BREEZE) liquid 1 Container    1 Container Oral BID BM [name], MD   1 Container at 08/19/15 0920  . feeding supplement (PRO-STAT SUGAR FREE 64) liquid 30 mL  30 mL Oral Q1500 [name], MD   30 mL at 08/18/15 1641  . levothyroxine (SYNTHROID, LEVOTHROID) tablet 137 mcg  137 mcg Oral Q supper [name], MD   137 mcg at 08/18/15 1637  . losartan (COZAAR) tablet 25 mg  25 mg Oral Daily [name], MD   25 mg at 08/19/15 0912  . ondansetron (ZOFRAN) tablet 4 mg  4 mg Oral Q6H PRN [name], MD       Or  . ondansetron [facility](ZOFRAN) injection 4 mg  4 mg Intravenous Q6H PRN [name], MD      . potassium chloride SA  (K-DUR,KLOR-CON) CR tablet 20 mEq  20 mEq Oral Daily [name], MD   20 mEq at 08/19/15 0912  . Rivaroxaban (XARELTO) tablet 15 mg  15 mg Oral Q supper [name], MD   15 mg at 08/18/15 1637  . sodium chloride flush (NS) 0.9 % injection 3 mL  3 mL Intravenous Q12H [name], MD   3 mL at 08/19/15 0920     Discharge Medications: Please see discharge summary for a list of discharge medications.  Relevant Imaging Results:  Relevant Lab Results:   Additional Information    [name], LCSW

## 2015-08-19 NOTE — Progress Notes (Signed)
CM received call from MD to please expedite placement if possible.  CSW Judie GrieveBryan is aware and arranging.  No other CM needs were communicated.

## 2015-08-19 NOTE — Clinical Social Work Placement (Signed)
   CLINICAL SOCIAL WORK PLACEMENT  NOTE  Date:  08/19/2015  Patient Details  Name: Angela ChildLucille S Noble MRN: [id number] Date of Birth: 01/18/1933  Clinical Social Work is seeking post-discharge placement for this patient at the Skilled  Nursing Facility level of care (*CSW will initial, date and re-position this form in  chart as items are completed):  Yes   Patient/family provided with [facility] Clinical Social Work Department's list of facilities offering this level of care within the geographic area requested by the patient (or if unable, by the patient's family).  Yes   Patient/family informed of their freedom to choose among providers that offer the needed level of care, that participate in Medicare, Medicaid or managed care program needed by the patient, have an available bed and are willing to accept the patient.  Yes   Patient/family informed of [facility]'s ownership interest in [facility] and [facility], as well as of the fact that they are under no obligation to receive care at these facilities.  PASRR submitted to EDS on 08/19/15     PASRR number received on 08/19/15     Existing PASRR number confirmed on       FL2 transmitted to all facilities in geographic area requested by pt/family on       FL2 transmitted to all facilities within larger geographic area on 08/19/15     Patient informed that his/her managed care company has contracts with or will negotiate with certain facilities, including the following:            Patient/family informed of bed offers received.  Patient chooses bed at       Physician recommends and patient chooses bed at      Patient to be transferred to   on  .  Patient to be transferred to facility by       Patient family notified on   of transfer.  Name of family member notified:        PHYSICIAN       Additional Comment:    _______________________________________________ Ria Bushyiesha K Dalyah Pla, LCSW 08/19/2015, 4:05 PM

## 2015-08-20 DIAGNOSIS — I10 Essential (primary) hypertension: Secondary | ICD-10-CM | POA: Diagnosis not present

## 2015-08-20 DIAGNOSIS — R4189 Other symptoms and signs involving cognitive functions and awareness: Secondary | ICD-10-CM | POA: Diagnosis not present

## 2015-08-20 DIAGNOSIS — I4891 Unspecified atrial fibrillation: Secondary | ICD-10-CM

## 2015-08-20 DIAGNOSIS — R627 Adult failure to thrive: Secondary | ICD-10-CM

## 2015-08-20 DIAGNOSIS — R531 Weakness: Secondary | ICD-10-CM | POA: Diagnosis not present

## 2015-08-20 LAB — FOLATE RBC
FOLATE, HEMOLYSATE: 551.7 ng/mL
Folate, RBC: 1407 ng/mL (ref >498)
HEMATOCRIT: 39.2 % (ref 34.0–46.6)

## 2015-08-20 MED ORDER — ACETAMINOPHEN 325 MG PO TABS: 650.0000 mg | ORAL_TABLET | Freq: Four times a day (QID) | ORAL | Status: DC | PRN

## 2015-08-20 MED ORDER — BOOST / RESOURCE BREEZE PO LIQD: 1.0000 | Freq: Two times a day (BID) | ORAL | Status: DC

## 2015-08-20 NOTE — Care Management Obs Status (Signed)
MEDICARE OBSERVATION STATUS NOTIFICATION   Patient Details  Name: Angela Blackburn MRN: [id number] Date of Birth: 02/26/1933   Medicare Observation Status Notification Given:  Yes    Cherylann ParrClaxton, Nahal Wanless S, RN 08/20/2015, 4:56 PM

## 2015-08-20 NOTE — Clinical Social Work Note (Addendum)
Patient has now had PT eval recommending SNF. [facility] and spoke with their admissions coordinator. She will give information to nurse and let CSW know.  Charlynn CourtSarah Galileah Piggee, CSW [phone]  2:18 pm Admissions coordinator called CSW back and they are able to extend a bed offer. Will notify patient, family, and MD.  Charlynn CourtSarah Kosha Jaquith, CSW [phone]  2:29 pm Patient and family notified. Per patient's daughter, patient waiting on a swallow study. RN called CSW and said that could be done before she leaves or when she gets to SNF if put in dc summary. MD paged.   Charlynn CourtSarah Islay Polanco, CSW [phone]

## 2015-08-20 NOTE — Evaluation (Signed)
Clinical/Bedside Swallow Evaluation Patient Details  Name: Angela Blackburn MRN: [id number] Date of Birth: 21-Jul-1932  Today's Date: 08/20/2015 Time: SLP Start Time (ACUTE ONLY): 1630 SLP Stop Time (ACUTE ONLY): 1600 SLP Time Calculation (min) (ACUTE ONLY): 1410 min  Past Medical History:  Past Medical History  Diagnosis Date  . Chest pain     Hx  . Hypothyroidism   . Hyperlipidemia     no meds  . Knee pain   . Peripheral neuropathy (HCC)   . Fibromyalgia   . PAF (paroxysmal atrial fibrillation) (HCC)     a. 06/2012 Flecainide and Xarelto started;  b. 06/2012 DCCV;  c. 06/2012 Syncope in setting of bradycardia->flecainide and dilt d/c'd->Multaq started.  . Syncope     a. 06/2012 - in setting of bradycardia following dccv->flecainide and dilt d/c'd.  . SVD (spontaneous vaginal delivery)   . COPD (chronic obstructive pulmonary disease) (HCC)     uses inhaler  . Arthritis     knees/hands  . Hypertension     no meds currently - Dr Melburn Popper  . History of nuclear stress test     Myoview 11/16: EF 75%, no ischemia. Low Risk  . UTI (lower urinary tract infection)   . Dementia    Past Surgical History:  Past Surgical History  Procedure Laterality Date  . US echocardiography  09/14/2007    EF 55-60%  . Cardiovascular stress test  08/29/2009    EF 86%  . Abdominal hysterectomy    . Joint replacement      Lt. TKA in 2010  . Cardioversion N/A 01/11/2013    Procedure: CARDIOVERSION;  Surgeon: Lewayne Bunting, MD;  Location[facility] ENDOSCOPY;  Service: Cardiovascular;  Laterality: N/A;  . Cardioversion N/A 06/22/2013    Procedure: CARDIOVERSION;  Surgeon: Vesta Mixer, MD;  Location: [facility] ENDOSCOPY;  Service: Cardiovascular;  Laterality: N/A;  . Colonoscopy    . Anterior and posterior repair N/A 12/21/2013    Procedure: ANTERIOR (CYSTOCELE) AND POSTERIOR REPAIR (RECTOCELE);  Surgeon: Lavina Hamman, MD;  Location: [facility] ORS;  Service: Gynecology;  Laterality: N/A;   HPI:  82 y.o. woman with a  history of HTN, neuropathy, HLD, paroxysmal atrial fibrillation, hypothyroidism, COPD, syncope, Lt TKA, who has had progressive weakness with associated gait disturbance and falls over the past several weeks. She has also had a resting tremor, change in her demeanor, and has stopped feeding herself.   Assessment / Plan / Recommendation Clinical Impression  Pt reports that she cannot swallow, but her  oropharyngeal swallow appears WFL. She swallows swiftly and maintains a clear vocal quality, although she does have delayed throat clearing after trials of graham crackers, describing a globus sensation. Suspect cognitive component combined with possible esophageal issues as well, as she reports having reflux. Recommend Dys 2 diet and thin liquids to facilitate PO intake with additional f/u at SNF to determine possible advancement.    Aspiration Risk  Mild aspiration risk    Diet Recommendation Dysphagia 2 (Fine chop);Thin liquid   Liquid Administration via: Cup;Straw Medication Administration: Whole meds with puree Supervision: Patient able to self feed;Full supervision/cueing for compensatory strategies Compensations: Slow rate;Small sips/bites;Minimize environmental distractions;Follow solids with liquid Postural Changes: Seated upright at 90 degrees;Remain upright for at least 30 minutes after po intake    Other  Recommendations Oral Care Recommendations: Oral care BID   Follow up Recommendations  Skilled Nursing facility    Frequency and Duration min 1 x/week  1 week  Prognosis Prognosis for Safe Diet Advancement: Fair Barriers to Reach Goals: Cognitive deficits      Swallow Study   General HPI: 82 y.o. woman with a history of HTN, neuropathy, HLD, paroxysmal atrial fibrillation, hypothyroidism, COPD, syncope, Lt TKA, who has had progressive weakness with associated gait disturbance and falls over the past several weeks. She has also had a resting tremor, change in her  demeanor, and has stopped feeding herself. Type of Study: Bedside Swallow Evaluation Previous Swallow Assessment: none in chart Diet Prior to this Study: Regular;Thin liquids Temperature Spikes Noted: No Respiratory Status: Room air History of Recent Intubation: No Behavior/Cognition: Alert;Requires cueing Oral Care Completed by SLP: No Oral Cavity - Dentition: Adequate natural dentition Vision: Functional for self-feeding Self-Feeding Abilities: Able to feed self Patient Positioning: Upright in bed Baseline Vocal Quality: Normal    Oral/Motor/Sensory Function Overall Oral Motor/Sensory Function: Within functional limits   Ice Chips Ice chips: Not tested   Thin Liquid Thin Liquid: Within functional limits Presentation: Cup;Self Fed;Straw    Nectar Thick Nectar Thick Liquid: Not tested   Honey Thick Honey Thick Liquid: Not tested   Puree Puree: Within functional limits Presentation: Self Fed;Spoon   Solid   GO   Solid: Impaired Presentation: Self Fed Pharyngeal Phase Impairments: Throat Clearing - Delayed    Functional Assessment Tool Used: skilled clinical judgment Functional Limitations: Swallowing Swallow Current Status (E4540(G8996): At least 20 percent but less than 40 percent impaired, limited or restricted Swallow Goal Status [phone](G8997): At least 20 percent but less than 40 percent impaired, limited or restricted   Angela Blackburn, M.A. CCC-SLP [phone]  Angela Blackburn, Ahja Martello 08/20/2015,4:42 PM

## 2015-08-20 NOTE — Evaluation (Signed)
Physical Therapy Evaluation Patient Details Name: Angela Blackburn MRN: [id number] DOB: 06/03/1932 Today's Date: 08/20/2015   History of Present Illness  82 y.o. woman with a history of HTN, neuropathy, HLD, paroxysmal atrial fibrillation, hypothyroidism, COPD, syncope, Lt TKA, who has had progressive weakness with associated gait disturbance and falls over the past several weeks. She has also had a resting tremor, change in her demeanor, and has stopped feeding herself.  Clinical Impression  Pt admitted with above diagnosis. Pt currently with functional limitations due to the deficits listed below (see PT Problem List).  Pt will benefit from skilled PT to increase their independence and safety with mobility to allow discharge to SNF for further rehabilitation. Pt requiring mod/max assist X2 for bed mobility and max assist for attempting sit/stand (recommending +2 assistance).        Follow Up Recommendations SNF;Supervision/Assistance - 24 hour    Equipment Recommendations  None recommended by PT    Recommendations for Other Services       Precautions / Restrictions Precautions Precautions: Fall Restrictions Weight Bearing Restrictions: No      Mobility  Bed Mobility Overal bed mobility: Needs Assistance Bed Mobility: Supine to Sit;Sit to Supine;Rolling Rolling: Max assist   Supine to sit: Mod assist Sit to supine: Mod assist   General bed mobility comments: cues and physical assist for pt to reach for rails, HOB elevated with supine to sitting and assist with pad to sit EOB  Transfers Overall transfer level: Needs assistance Equipment used: Rolling walker (2 wheeled) Transfers: Sit to/from Stand Sit to Stand: Max assist         General transfer comment: Attempted to stand X2 from EOB, pt unable to fully stand but improving with cues to extend knees. Pt unable to fully stand despite assistance.   Ambulation/Gait             General Gait Details: unable to  attempt  Stairs            Wheelchair Mobility    Modified Rankin (Stroke Patients Only)       Balance Overall balance assessment: Needs assistance Sitting-balance support: No upper extremity supported Sitting balance-Leahy Scale: Fair     Standing balance support: Bilateral upper extremity supported Standing balance-Leahy Scale: Zero Standing balance comment: unable to fully stand                             Pertinent Vitals/Pain Pain Assessment: Faces Pain Score: 0-No pain    Home Living Family/patient expects to be discharged to:: Skilled nursing facility Living Arrangements: Spouse/significant other Available Help at Discharge: Family;Available 24 hours/day (spouse) Type of Home: House Home Access: Stairs to enter   [employer] of Steps: 4   Home Equipment: Walker - 2 wheels;Cane - single point      Prior Function Level of Independence: Needs assistance   Gait / Transfers Assistance Needed: recently needing 2 people to get out of the house to the hospital.   ADL's / Homemaking Assistance Needed: assist needed with bathing and dressing        Hand Dominance        Extremity/Trunk Assessment   Upper Extremity Assessment: Generalized weakness           Lower Extremity Assessment: Generalized weakness         Communication   Communication: No difficulties  Cognition Arousal/Alertness: Awake/alert Behavior During Therapy: Anxious Overall Cognitive Status: Impaired/Different from baseline  Area of Impairment: Orientation;Following commands;Safety/judgement;Awareness Orientation Level: Time;Situation     Following Commands: Follows one step commands inconsistently Safety/Judgement: Decreased awareness of safety;Decreased awareness of deficits Awareness: Emergent   General Comments: family reporting progressive worsening of confusion over last several weeks.     General Comments      Exercises General Exercises  - Lower Extremity Long Arc Quad: Right;Left;5 reps      Assessment/Plan    PT Assessment Patient needs continued PT services  PT Diagnosis Difficulty walking;Generalized weakness   PT Problem List Decreased strength;Decreased activity tolerance;Decreased balance;Decreased mobility  PT Treatment Interventions DME instruction;Gait training;Functional mobility training;Therapeutic activities;Therapeutic exercise;Patient/family education   PT Goals (Current goals can be found in the Care Plan section) Acute Rehab PT Goals Patient Stated Goal: be able to stand PT Goal Formulation: With patient/family Time For Goal Achievement: 09/03/15 Potential to Achieve Goals: Fair    Frequency Min 2X/week   Barriers to discharge        Co-evaluation               End of Session Equipment Utilized During Treatment: Gait belt Activity Tolerance: Patient tolerated treatment well Patient left: in bed;with call bell/phone within reach;with bed alarm set;with family/visitor present Nurse Communication: Mobility status    Functional Assessment Tool Used: clinical judgment Functional Limitation: Mobility: Walking and moving around Mobility: Walking and Moving Around Current Status [phone](G8978): At least 60 percent but less than 80 percent impaired, limited or restricted Mobility: Walking and Moving Around Goal Status [phone]([phone]): At least 40 percent but less than 60 percent impaired, limited or restricted    Time: 1030-1101 PT Time Calculation (min) (ACUTE ONLY): 31 min   Charges:   PT Evaluation $PT Eval Moderate Complexity: 1 Procedure PT Treatments $Therapeutic Activity: 8-22 mins   PT G Codes:   PT G-Codes **NOT FOR INPATIENT CLASS** Functional Assessment Tool Used: clinical judgment Functional Limitation: Mobility: Walking and moving around Mobility: Walking and Moving Around Current Status ([id number](G8978): At least 60 percent but less than 80 percent impaired, limited or restricted Mobility:  Walking and Moving Around Goal Status [phone]([phone]): At least 40 percent but less than 60 percent impaired, limited or restricted    Christiane HaBenjamin J. Palestine Mosco, PT, CSCS Pager [phone] Office [phone]  08/20/2015, 11:36 AM

## 2015-08-20 NOTE — Progress Notes (Signed)
Made aware by patient's daughter that she hadn't voided all night. Was told bladder scan was done around 0530 by patient's husband and nurse tech for dayshift however no documentation noted. Order received to obtain additional bladder scan which showed 732mL. [facility] and patient voided 600cc and had large BM. Pt stated she felt much better. Will continue to monitor closely.

## 2015-08-20 NOTE — Progress Notes (Signed)
Subjective: No acute events.   Exam: Filed Vitals:   08/19/15 2108 08/20/15 0521  BP: 139/75 162/85  Pulse: 72 68  Temp: 98.6 F (37 C) 97.7 F (36.5 C)  Resp: 16 17   Gen: In bed, NAD Resp: non-labored breathing, no acute distress Abd: soft, nt  Neuro: MS: awake, alert, oriented x 3.  KV:QQVZDCN:PERRL, EOMI Motor: MAEW, coarse tremor with some increased tone bilaterally.  Sensory:intact to LT  Impression: 82 yo F with symptoms suggestive of a progressive dementia, and worsening motor function in the setting of lexapro and benzodiazepines. Though [place] does have some benefti with drug induced parkinsonism, I am hesitant in patients with dementia to start anti-cholinergics. I would favor observation off of lexapro/benzodiazepines  Recommendations: 1) agree with stopping lexapro/benzos.  2) could use other SSRI if needed, but would wait at least a week or two to avoid clouding the picture.  3) please call with further questions or concerns.   Ritta SlotMcNeill Johan Creveling, MD [facility] [phone]  If 7pm- 7am, please page neurology on call as listed in AMION.

## 2015-08-20 NOTE — Discharge Summary (Addendum)
Physician Discharge Summary  Angela Blackburn ZOX:[id number] DOB: Jan 01, 1933 DOA: 08/17/2015  PCP: Eartha Inch, MD  Admit date: 08/17/2015 Discharge date: 08/21/2015  Recommendations for Outpatient Follow-up:  1. Please advoid sedatives, narcotics due to worsening mental status  Discharge Diagnoses:  Principal Problem:   Weakness Active Problems:   HTN (hypertension)   Atrial fibrillation (HCC)   Failure to thrive in adult   Falls   Cognitive impairment   Hypothyroidism   Protein-calorie malnutrition, severe    Discharge Condition: stable   Diet recommendation: dysphagia 2 diet  History of present illness:   Per brief narrative 08/19/2015 "82 y.o. woman with a history of HTN, HLD, paroxysmal atrial fibrillation anticoagulated with Xarelto, and hypothyroidism who has had progressive weakness with associated gait disturbance and falls over the past several weeks. She has also had a resting tremor, change in her demeanor, and has stopped feeding herself. She was evaluated in the ED earlier this month, and a diagnosis of Parkinson's disease was suggested. Of note, CT of the head showed prior infarcts, which the family had not been aware of. She followed up with neurology as an outpatient as recommended. Per the family, there has not been a formal diagnosis for her symptoms, but dementia was also mentioned. She was referred for outpatient MRI, which did not happen due to combativeness/patietn noncompliance. She was advised to come to the ED for further evaluation. The family feels that the patient has declined even over the past 24 hours. She is requiring two person assistance with ambulation, which is new. She is not eating well, and requires assistance with meals. No pain complaints. No dysuria. No bites. No rashes. Daughter admits that symptoms have been worse since starting lexapro several weeks ago. Apparently, there was an initial concern for depression. Lexapro is  being tapered off; the patient has two doses left. Also, the patient was on restoril for insomnia AND klonopin prn for anxiety. These medications were new in the past 3-4 weeks."   Hospital Course:   Assessment & Plan:  Adult failure to thrive with new cognitive impairment, falls, weakness, and gait disturbance - Secondary to dementia complicated by medications  - Klonopin, lexapro all on hold  - Stable to return to SNF  - MRI brain with advanced atrophy and chronic ischemia  - Neurology consulted, recommend avoiding anticholinergics  Essential hypertension - Continue Cozaar  History of atrial fibrillation - CHADS vasc score 3 - On AC with xarelto - Rate controlled with multaq   Hypothyroidism - Continue synthroid   Microcopic hematuria  - Noted on U/A - Renal US negative.   Severe protein calorie malnutrition - Dysphagia 2 diet per SLP - Seen by dietician    DVT prophylaxis: xarelto  Code Status: FULL Family Communication: Husband and daughter present in the ED at time of admission   Consultants:  neurology  Procedures:  MRI  Renal US   Signed:  Manson Passey, MD  [facility] 08/20/2015, 4:41 PM  Pager #: [phone]  Time spent in minutes: more than 30 minutes   Discharge Exam: Filed Vitals:   08/20/15 0521 08/20/15 1415  BP: 162/85 118/65  Pulse: 68 66  Temp: 97.7 F (36.5 C)   Resp: 17 16   Filed Vitals:   08/19/15 1137 08/19/15 2108 08/20/15 0521 08/20/15 1415  BP: 144/68 139/75 162/85 118/65  Pulse: 64 72 68 66  Temp: 97.2 F (36.2 C) 98.6 F (37 C) 97.7 F (36.5 C)   TempSrc: Oral Oral  Oral   Resp: Height:      Weight:   64.3 kg (141 lb 12.1 oz)   SpO2: 98% 97% 98% 97%    General: Pt is alert, follows commands appropriately, not in acute distress Cardiovascular: Regular rate and rhythm, S1/S2 + Respiratory: Clear to auscultation bilaterally, no wheezing, no crackles, no rhonchi Abdominal: Soft, non  tender, non distended, bowel sounds +, no guarding Extremities: no edema, no cyanosis, pulses palpable bilaterally DP and PT Neuro: Grossly nonfocal  Discharge Instructions  Discharge Instructions    Call MD for:  difficulty breathing, headache or visual disturbances    Complete by:  As directed      Call MD for:  persistant dizziness or light-headedness    Complete by:  As directed      Call MD for:  persistant nausea and vomiting    Complete by:  As directed      Call MD for:  severe uncontrolled pain    Complete by:  As directed      Diet - low sodium heart healthy    Complete by:  As directed      Increase activity slowly    Complete by:  As directed             Medication List    STOP taking these medications        clonazePAM 0.5 MG tablet  Commonly known as:  KLONOPIN     escitalopram 20 MG tablet  Commonly known as:  LEXAPRO     temazepam 15 MG capsule  Commonly known as:  RESTORIL      TAKE these medications        acetaminophen 325 MG tablet  Commonly known as:  TYLENOL  Take 2 tablets (650 mg total) by mouth every 6 (six) hours as needed for mild pain (or Fever >/= 101).     cephALEXin 250 MG capsule  Commonly known as:  KEFLEX  Take 250 mg by mouth daily.     feeding supplement Liqd  Take 1 Container by mouth 2 (two) times daily between meals.     KLOR-CON M20 20 MEQ tablet  Generic drug:  potassium chloride SA  TAKE 1 TABLET BY MOUTH EVERY DAY     levothyroxine 137 MCG tablet  Commonly known as:  SYNTHROID, LEVOTHROID  Take 137 mcg by mouth every evening.     losartan 25 MG tablet  Commonly known as:  COZAAR  Take 25 mg by mouth daily.     MULTAQ 400 MG tablet  Generic drug:  dronedarone  TAKE 1 TABLET (400 MG TOTAL) BY MOUTH 2 (TWO) TIMES DAILY WITH A MEAL.     PROAIR HFA 108 (90 Base) MCG/ACT inhaler  Generic drug:  albuterol  Inhale 2 puffs into the lungs every 6 (six) hours as needed for shortness of breath.     Rivaroxaban 15 MG  Tabs tablet  Commonly known as:  XARELTO  Take 1 tablet (15 mg total) by mouth daily with supper.           Follow-up Information    Follow up with [facility].   Specialty:  Skilled Nursing Facility   Contact information:   [address] [place] [address] [phone]      Follow up with Eartha Inch, MD. Schedule an appointment as soon as possible for a visit in 2 weeks.   Specialty:  Family Medicine  Why:  Follow up appt after recent hospitalization   Contact information:   [address] [place] [id number] [phone]        The results of significant diagnostics from this hospitalization (including imaging, microbiology, ancillary and laboratory) are listed below for reference.    Significant Diagnostic Studies: Ct Head Wo Contrast  08/07/2015  CLINICAL DATA:  Posterior headache. Altered mental status with somnolence EXAM: CT HEAD WITHOUT CONTRAST TECHNIQUE: Contiguous axial images were obtained from the base of the skull through the vertex without intravenous contrast. COMPARISON:  December 04, 2014 FINDINGS: There is moderate diffuse atrophy, stable. Atrophy is greatest in the frontal lobe regions bilaterally, stable. There is no intracranial mass, hemorrhage, extra-axial fluid collection, or midline shift. There is small vessel disease throughout the centra semiovale bilaterally, stable. There is a prior infarct in the medial left temporal lobe as well as nearby small vessel disease throughout the anterior limb of the left external capsule. There is evidence of a prior infarct at the gray -white junction in the mid left frontal lobe medially, stable. There is a prior infarct at the gray - white junction of the medial right occipital lobe, stable. There is no new gray-white compartment lesion. No acute infarct evident. The bony calvarium appears intact. The mastoid air cells are clear. No intraorbital lesions are evident. IMPRESSION:  Atrophy with small vessel disease and prior infarcts as summarized above. No acute infarct evident. No hemorrhage or mass effect. Electronically Signed   By: Bretta BangWilliam  Woodruff III M.D.   On: 08/07/2015 11:35   Mr Laqueta JeanBrain W OZWo Contrast  08/18/2015  CLINICAL DATA:  Dementia, failure to thrive, progressive weakness, gait disturbance, multiple falls. EXAM: MRI HEAD WITHOUT AND WITH CONTRAST TECHNIQUE: Multiplanar, multiecho pulse sequences of the brain and surrounding structures were obtained without and with intravenous contrast. CONTRAST:  14mL MULTIHANCE GADOBENATE DIMEGLUMINE 529 MG/ML IV SOLN COMPARISON:  CT head 08/07/2015. FINDINGS: No evidence for acute infarction, hemorrhage, mass lesion, hydrocephalus, or extra-axial fluid. Global atrophy. Extensive T2 and FLAIR hyperintensities throughout the white matter, consistent with chronic microvascular ischemic change. Cortical volume loss favored to represent diffuse atrophy, although some areas of chronic subcortical white matter infarction are observed, most notable in the LEFT frontal region. Post infusion, no abnormal enhancement of the brain or meninges. Flow voids are maintained. No parenchymal foci of chronic hemorrhage. Pituitary is mildly prominent, but not definitely enlarged. No cerebellar tonsillar herniation. Upper cervical region shows no osseous lesions or cord compression. Extracranial soft tissues are unremarkable. IMPRESSION: Advanced atrophy. Extensive white matter disease. Scattered areas of chronic ischemia. No acute stroke or hemorrhage. No abnormal postcontrast enhancement. Electronically Signed   By: Elsie StainJohn T Curnes M.D.   On: 08/18/2015 11:27   [place] Renal  08/18/2015  CLINICAL DATA:  Microscopic hematuria. EXAM: RENAL / URINARY TRACT ULTRASOUND COMPLETE COMPARISON:  CT of the abdomen pelvis 07/11/2014 FINDINGS: Right Kidney: Length: 9.8 cm. Echogenicity within normal limits. No mass or hydronephrosis visualized. Left Kidney: Length: 9.9 cm.  Echogenicity within normal limits. No mass or hydronephrosis visualized. Bladder: Appears normal for degree of bladder distention. IMPRESSION: Normal renal ultrasound. Electronically Signed   By: Ted Mcalpineobrinka  Dimitrova M.D.   On: 08/18/2015 12:18    Microbiology: No results found for this or any previous visit (from the past 240 hour(s)).   Labs: Basic Metabolic Panel:  Recent Labs Lab 08/17/15 1719  NA 137  K 4.1  CL 103  CO2 25  GLUCOSE 94  BUN 24*  CREATININE 0.97  CALCIUM 9.4   Liver Function Tests:  Recent Labs Lab 08/17/15 1719  AST 28  ALT 21  ALKPHOS 45  BILITOT 1.4*  PROT 7.5  ALBUMIN 4.0   No results for input(s): LIPASE, AMYLASE in the last 168 hours. No results for input(s): AMMONIA in the last 168 hours. CBC:  Recent Labs Lab 08/17/15 1719 08/18/15 0001  WBC 7.4  --   NEUTROABS 4.7  --   HGB 13.4  --   HCT 40.1 39.2  MCV 90.5  --   PLT 241  --    Cardiac Enzymes: No results for input(s): CKTOTAL, CKMB, CKMBINDEX, TROPONINI in the last 168 hours. BNP: BNP (last 3 results)  Recent Labs  12/04/14 0850  BNP 120.7*    ProBNP (last 3 results) No results for input(s): PROBNP in the last 8760 hours.  CBG: No results for input(s): GLUCAP in the last 168 hours.

## 2015-08-21 DIAGNOSIS — R531 Weakness: Secondary | ICD-10-CM

## 2015-08-21 NOTE — Plan of Care (Signed)
Problem: Health Behavior/Discharge Planning: Goal: Ability to manage health-related needs will improve Outcome: Adequate for Discharge Discharging to SNF.  Husband and daughter aware of plan and agree

## 2015-08-21 NOTE — Clinical Social Work Note (Signed)
CSW facilitated patient discharge including contacting patient family and facility to confirm patient discharge plans. Clinical information faxed to facility and family agreeable with plan. CSW [facility] RN to call report prior to discharge [phone]).  CSW will sign off for now as social work intervention is no longer needed. Please consult us again if new needs arise.  Charlynn CourtSarah Cleatus Gabriel, CSW [phone]

## 2015-08-21 NOTE — Progress Notes (Signed)
[facility] and given to [place]. Daughter and husband at bedside.  No voiced complaints.

## 2015-08-21 NOTE — Progress Notes (Signed)
Patient seen and examined at the bedside. Spoke with the patient and family at the bedside, patient's husband and daughter. Patient is medically stable for discharge to skilled nursing facility today. She has been seen by swallow evaluation team and she will continue on dysphagia 2 diet. In regards to urinary retention the bladder scan yesterday showed roughly 600-700 mL but patient had adequate urine output after that.  Please refer to discharge summary completed 08/20/2015. No changes in medications from 08/20/2015.  Manson Passeylma Devine [facility] [phone]

## 2015-08-21 NOTE — Plan of Care (Signed)
Problem: Pain Managment: Goal: General experience of comfort will improve Outcome: Not Applicable Date Met:  96/43/83 Denies pain and discomfort

## 2015-09-28 ENCOUNTER — Telehealth: Payer: Self-pay | Admitting: Cardiovascular Disease

## 2015-09-28 NOTE — Telephone Encounter (Signed)
Spoke with Ander Slade, nurse at patient's [facility], who called to discuss patient's medications.  She states patient came to their facility on June 20 after being d/c'ed from [facility].  She reports patient's MAR has documented for patient to stop Multaq on 6/23.  She states there was also discussion of changing patient from Xarelto to Eliquis but patient has a documented allergy to eliquis.  I advised that from a cardiology perspective, patient should continue both multaq and xarelto for a fib rate control and anticoagulation.  She states patient was started on Citalopram 20 mg daily and there is an interaction with multaq.  I advised patient should continue multaq and she states she will discuss with resident physician an alternative for citalopram.  She thanked me for my help.

## 2015-09-28 NOTE — Telephone Encounter (Signed)
NEW Messag  RN w/ [facility] calling to speak w/ rN about pt's Xarelto and multaq- Please call back and discuss.

## 2017-04-29 IMAGING — MR MR HEAD WO/W CM
11 of 14 series · 29 of 48 positions shown · IV contrast (Yes MH)
Comparison: CT head 08/07/2015.

CLINICAL DATA: Dementia, failure to thrive, progressive weakness,
gait disturbance, multiple falls.

EXAM:
MRI HEAD WITHOUT AND WITH CONTRAST
TECHNIQUE: Multiplanar, multiecho pulse sequences of the brain and surrounding
structures were obtained without and with intravenous contrast.
CONTRAST:  14mL MULTIHANCE GADOBENATE DIMEGLUMINE 529 MG/ML IV SOLN

[Series 5: DWI · axial · 3.0mm · 0.94mm/px · z∈[-68,+91]mm · 6 of 108 slices shown (1 of 2)]
[im 1/108]
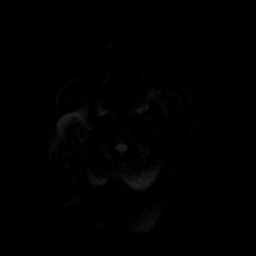
[im 22/108]
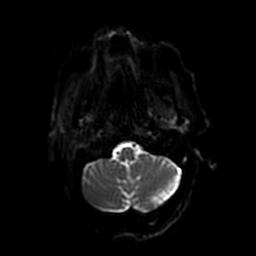
[im 43/108]
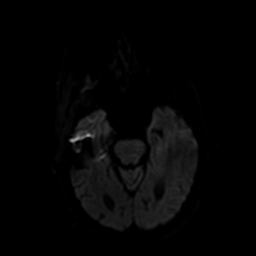
[im 65/108]
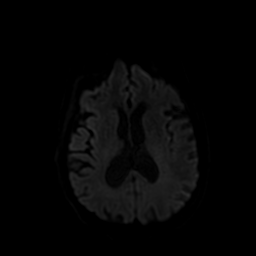
[im 86/108]
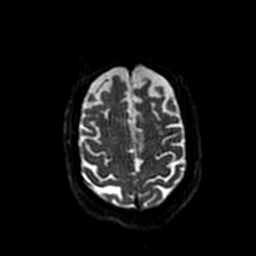
[im 108/108]
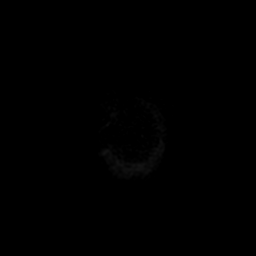

[Series 6: DWI · coronal · 4.0mm · 0.94mm/px · 4 of 74 slices shown (2 of 2)]
[im 1/74]
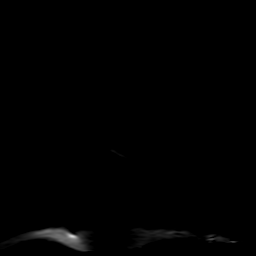
[im 25/74]
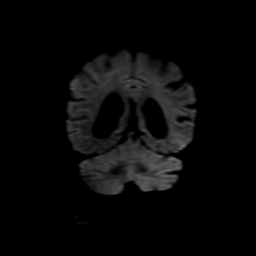
[im 49/74]
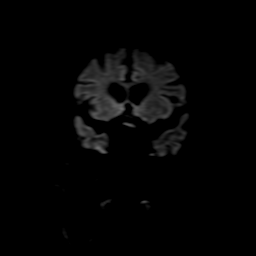
[im 74/74]
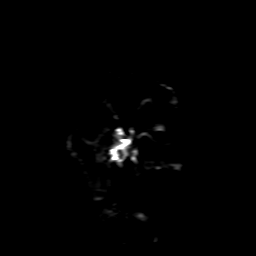

[Series 7: FLAIR · sagittal · 5.0mm · 0.47mm/px · 2 of 25 slices shown (1 of 2)]
[im 1/25]
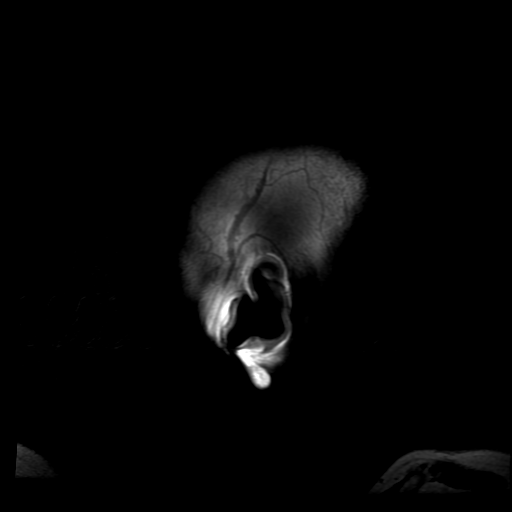
[im 25/25]
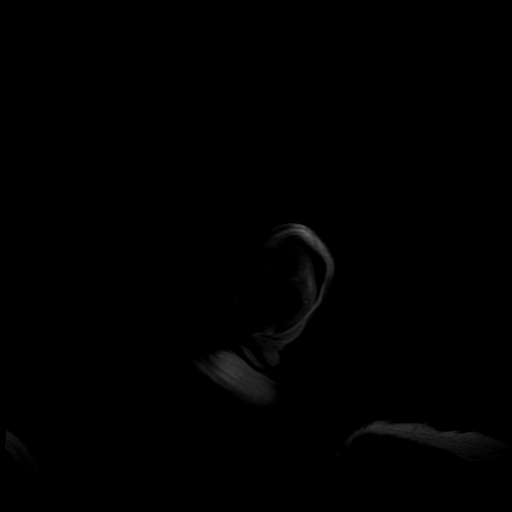

[Series 8: T2 · axial · 5.0mm · 0.47mm/px · z∈[-67,+89]mm · 2 of 27 slices shown]
[im 1/27]
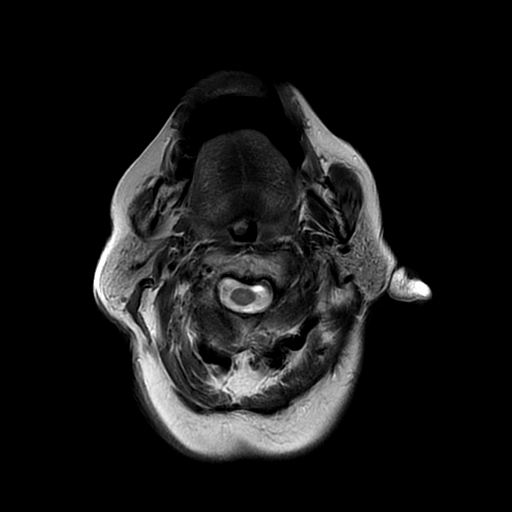
[im 27/27]
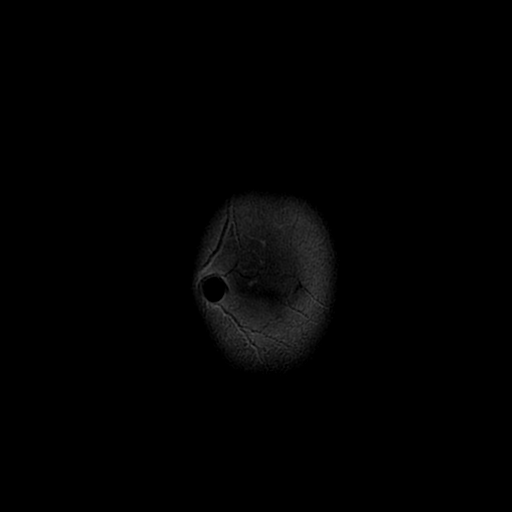

[Series 9: FLAIR · axial · 5.0mm · 0.47mm/px · z∈[-67,+89]mm · 2 of 27 slices shown (2 of 2)]
[im 1/27]
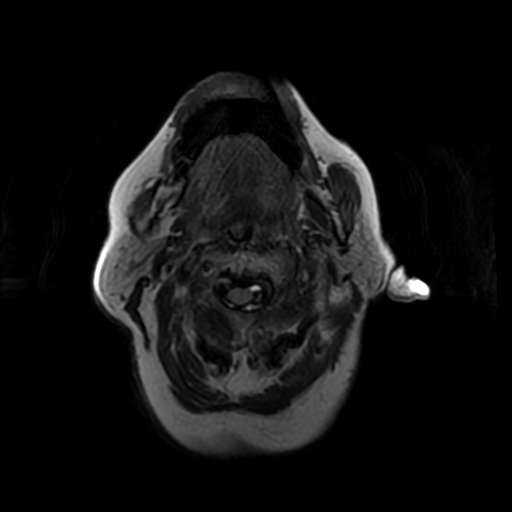
[im 27/27]
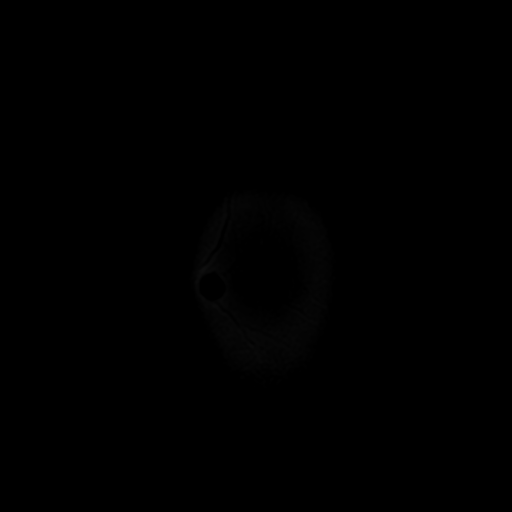

[Series 10: (person_name) · axial · 3.0mm · 0.47mm/px · z∈[-68,-43]mm · 2 of 108 slices shown]
[im 1/108]
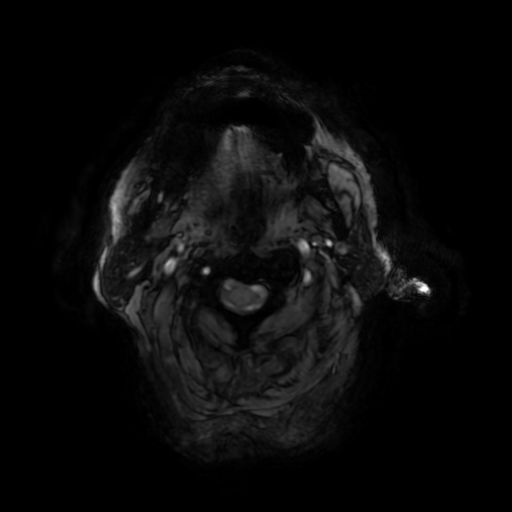
[im 18/108]
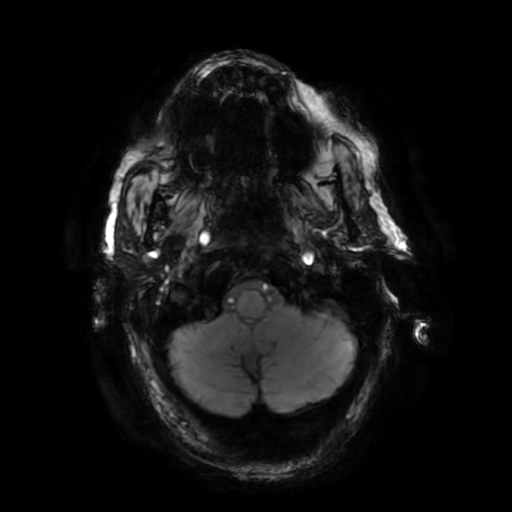

[Series 13: T2 post-contrast · coronal · 5.0mm · 0.39mm/px · 2 of 31 slices shown]
[im 1/31]
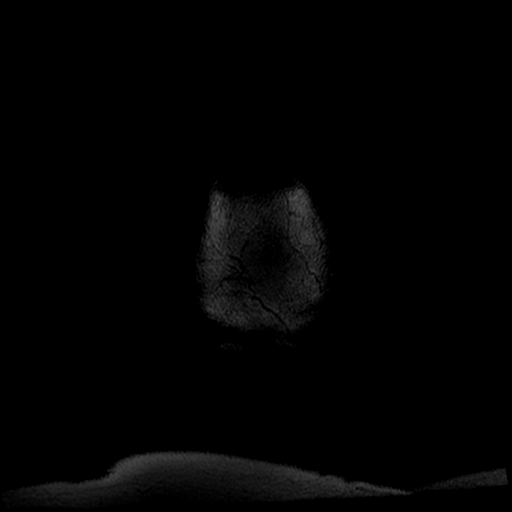
[im 31/31]
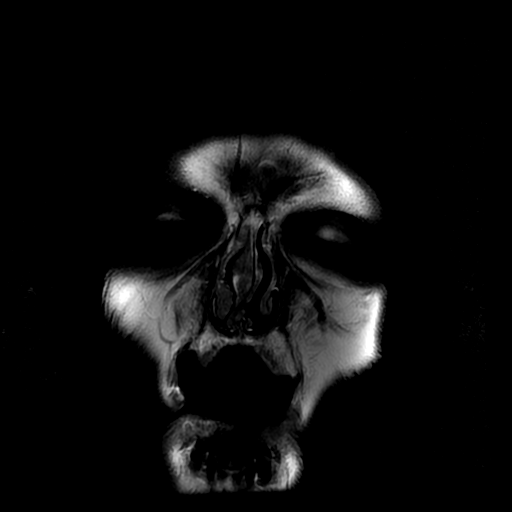

[Series 15: T1 · axial · 5.0mm · 0.47mm/px · z∈[-67,+89]mm · 2 of 27 slices shown (1 of 2)]
[im 1/27]
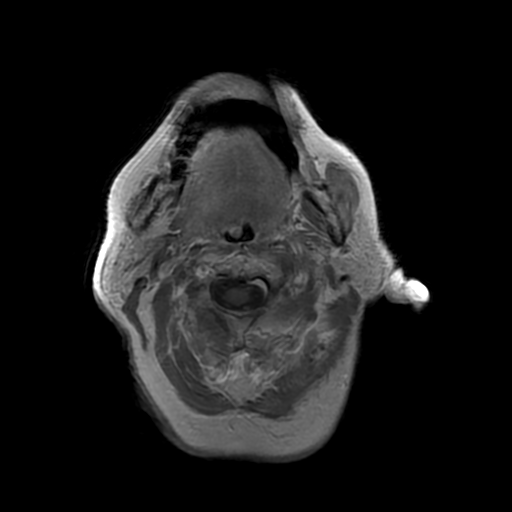
[im 27/27]
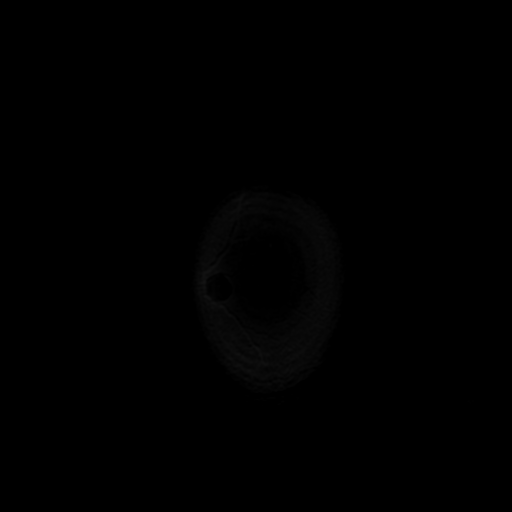

[Series 16: T1 · coronal · 5.0mm · 0.43mm/px · 2 of 31 slices shown (2 of 2)]
[im 1/31]
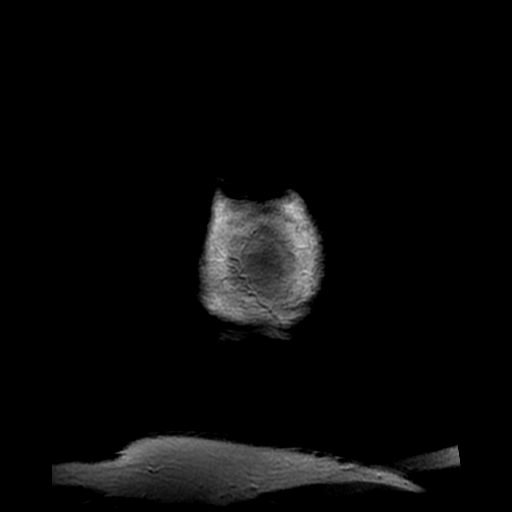
[im 31/31]
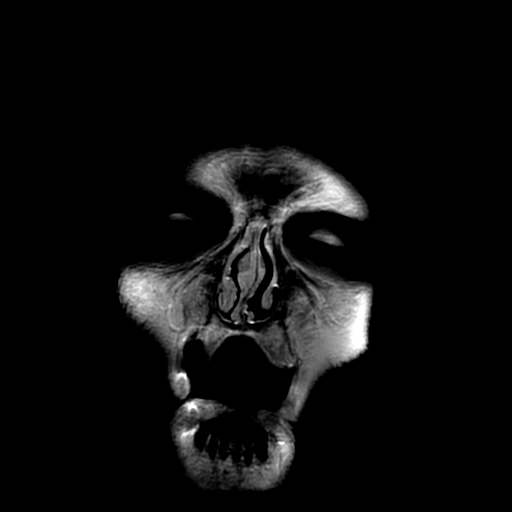

[Series 550: ADC · axial · 3.0mm · 0.94mm/px · z∈[-68,+91]mm · 3 of 54 slices shown (1 of 2)]
[im 1/54]
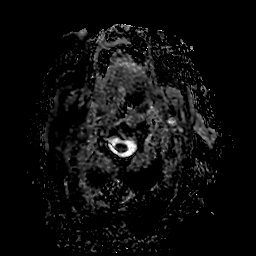
[im 27/54]
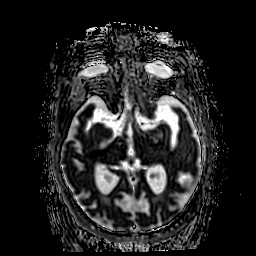
[im 54/54]
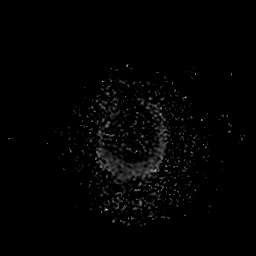

[Series 650: ADC · coronal · 4.0mm · 0.94mm/px · 2 of 37 slices shown (2 of 2)]
[im 1/37]
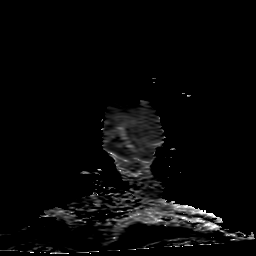
[im 37/37]
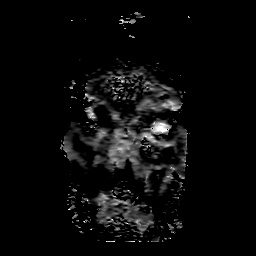

[29 of 48 positions shown; findings below may reference images not displayed]

FINDINGS: No evidence for acute infarction, hemorrhage, mass lesion,
hydrocephalus, or extra-axial fluid. Global atrophy. Extensive T2
and FLAIR hyperintensities throughout the white matter, consistent
with chronic microvascular ischemic change. Cortical volume loss
favored to represent diffuse atrophy, although some areas of chronic
subcortical white matter infarction are observed, most notable in
the LEFT frontal region.

Post infusion, no abnormal enhancement of the brain or meninges.

Flow voids are maintained. No parenchymal foci of chronic
hemorrhage.

Pituitary is mildly prominent, but not definitely enlarged. No
cerebellar tonsillar herniation. Upper cervical region shows no
osseous lesions or cord compression.

Extracranial soft tissues are unremarkable.
IMPRESSION: Advanced atrophy. Extensive white matter disease. Scattered areas of
chronic ischemia.

No acute stroke or hemorrhage.

No abnormal postcontrast enhancement.

## 2018-04-29 ENCOUNTER — Ambulatory Visit: Payer: Medicare Other | Admitting: Neurology

## 2018-04-29 ENCOUNTER — Encounter: Payer: Self-pay | Admitting: Neurology

## 2018-04-29 DIAGNOSIS — F0391 Unspecified dementia with behavioral disturbance: Secondary | ICD-10-CM

## 2018-04-29 DIAGNOSIS — F0390 Unspecified dementia without behavioral disturbance: Secondary | ICD-10-CM | POA: Insufficient documentation

## 2018-04-29 NOTE — Progress Notes (Signed)
PATIENT: Angela Blackburn DOB: Jul 30, 1932  Chief Complaint  Patient presents with  . Dementia    She is here with her husband, Angela Blackburn and daughter, Angela Blackburn.  Reports her mother has been at [facility] for three years.  Her memory has significantly declined and her hallucinations have increased.  She is unable to complete MMSE today.  [name] PCP    Angela Monday, MD     HISTORICAL  Angela Blackburn is a 85 year old female, seen in request by her primary care physician Dr. Ellsworth Blackburn, Angela Blackburn for evaluation of dementia, initial evaluation was on April 29, 2018.  She is accompanied by her husband Angela Blackburn, and daughter Angela Blackburn at today's visit  I have reviewed and summarized the referring note from the referring physician.  She had a past medical history of hypothyroidism, paroxysmal's atrial fibrillation, anticoagulation, malnutrition, anxiety.  Patient is retired from Clinical biochemist at banking, was very active church untill age 62, in the beginning of 2017, over short period of time, in 3 to 4 months, she had a quick decline in her function, she began to have depression, lost interest in her activity, she was treated with different anti-depression, sleeping aid without helping her symptoms, her symptoms continue to get worse, she cannot sleep at nighttime, no appetite, lost a lot of weight, was paranoid about her medications, very confrontational, later she had quick decline in her mobility, fell many times, was not able to walk without assistance.  She was seen by [facility] neurologist Dr. Cyndia Blackburn, reported no treatable etiology found, she was placed in nursing home since June 2017.  Since the initial quick decline, she continue have slow worsening, now she sits in wheelchair most of the time, not participate in group home activity, tends to have a lot of anxiety,  I personally reviewed MRI of the brain June 2017, advanced atrophy, supratentorium small vessel disease no acute  abnormality. Laboratory evaluations in 2017, normal TSH, B12, CBC,  REVIEW OF SYSTEMS: Full 14 system review of systems performed and notable only for easy bruising, depression anxiety trouble swallowing, skin sensitivity, difficulty swallowing, confusion, insomnia, sleepiness, memory loss All other review of systems were negative.  ALLERGIES: Allergies  Allergen Reactions  . Metoprolol Shortness Of Breath  . Crestor [Rosuvastatin Calcium] Other (See Comments)    myalgia  . Lipitor [Atorvastatin Calcium] Other (See Comments)    myalgia  . Livalo [Pitavastatin Calcium] Other (See Comments)    myalgia  . Other Other (See Comments)    All CHOLESTEROL MEDS-cause myalgia  . Zetia [Ezetimibe] Other (See Comments)    myalgia  . Lisinopril Cough  . Penicillins     Rash Can tolerate Amoxicillin well. Has patient had a PCN reaction causing immediate rash, facial/tongue/throat swelling, SOB or lightheadedness with hypotension: Unknown Has patient had a PCN reaction causing severe rash involving mucus membranes or skin necrosis: No Has patient had a PCN reaction that required hospitalization No Has patient had a PCN reaction occurring within the last 10 years: No If all of the above answers are "NO", then may proceed with Cephalosporin use.    . Statins Other (See Comments)    myalgia  . Eliquis [Apixaban]     Rash.    HOME MEDICATIONS: Current Outpatient Medications  Medication Sig Dispense Refill  . Acetaminophen (TYLENOL PO) Take 650 mg by mouth every 6 (six) hours as needed (anxiety, pain).    [name] acyclovir ointment (ZOVIRAX) 5 % Apply 1 application topically every 4 (  four) hours as needed (fever blister on lip).    . carboxymethylcellulose (REFRESH TEARS) 0.5 % SOLN Place 1 drop into both eyes 2 (two) times daily as needed.    . citalopram (CELEXA) 20 MG tablet Take 20 mg by mouth daily.    . clotrimazole (LOTRIMIN) 1 % cream Apply 1 application topically as needed (rash on  buttocks and peri area).    . Dextromethorphan-guaiFENesin 10-100 MG/5ML SOLN Take 15 mLs by mouth every 4 (four) hours as needed.    [name] levothyroxine (SYNTHROID, LEVOTHROID) 137 MCG tablet Take 137 mcg by mouth every evening.     . MULTAQ 400 MG tablet TAKE 1 TABLET (400 MG TOTAL) BY MOUTH 2 (TWO) TIMES DAILY WITH A MEAL. 60 tablet 11  . nystatin (MYCOSTATIN/NYSTOP) powder Apply topically 2 (two) times daily as needed (under right arm pit).    [name] olopatadine (PATANOL) 0.1 % ophthalmic solution Place 1 drop into both eyes 2 (two) times daily.    . Probiotic Product (RISA-BID PROBIOTIC PO) Take 1 tablet by mouth daily.    . Rivaroxaban (XARELTO) 15 MG TABS tablet Take 1 tablet (15 mg total) by mouth daily with supper. 30 tablet 11  . traZODone (DESYREL) 50 MG tablet Take 50 mg by mouth at bedtime.    [name] UNABLE TO FIND 2 (two) times daily. Med Name: A + D oinment - Apply to face and shoulders twice daily for dry skin.     No current facility-administered medications for this visit.     PAST MEDICAL HISTORY: Past Medical History:  Diagnosis Date  . Arthritis    knees/hands  . Atrial fibrillation (HCC)   . Chest pain    Hx  . COPD (chronic obstructive pulmonary disease) (HCC)    uses inhaler  . Dementia (HCC)   . Depression   . Fibromyalgia   . History of nuclear stress test    Myoview 11/16: EF 75%, no ischemia. Low Risk  . Hyperlipidemia    no meds  . Hypertension    no meds currently - Dr Angela Blackburn  . Hypothyroidism   . Knee pain   . PAF (paroxysmal atrial fibrillation) (HCC)    a. 06/2012 Flecainide and Xarelto started;  b. 06/2012 DCCV;  c. 06/2012 Syncope in setting of bradycardia->flecainide and dilt d/c'd->Multaq started.  . Peripheral neuropathy   . SVD (spontaneous vaginal delivery)   . Syncope    a. 06/2012 - in setting of bradycardia following dccv->flecainide and dilt d/c'd.  [name] UTI (lower urinary tract infection)     PAST SURGICAL HISTORY: Past Surgical History:   Procedure Laterality Date  . ABDOMINAL HYSTERECTOMY    . ANTERIOR AND POSTERIOR REPAIR N/A 12/21/2013   Procedure: ANTERIOR (CYSTOCELE) AND POSTERIOR REPAIR (RECTOCELE);  Surgeon: Lavina Hamman, MD;  Location: [facility];  Service: Gynecology;  Laterality: N/A;  . CARDIOVASCULAR STRESS TEST  08/29/2009   EF 86%  . CARDIOVERSION N/A 01/11/2013   Procedure: CARDIOVERSION;  Surgeon: Lewayne Bunting, MD;  Location: [facility] ENDOSCOPY;  Service: Cardiovascular;  Laterality: N/A;  . CARDIOVERSION N/A 06/22/2013   Procedure: CARDIOVERSION;  Surgeon: Vesta Mixer, MD;  Location: [facility] ENDOSCOPY;  Service: Cardiovascular;  Laterality: N/A;  . COLONOSCOPY    . JOINT REPLACEMENT     Lt. TKA in 2010  . US ECHOCARDIOGRAPHY  09/14/2007   EF 55-60%    FAMILY HISTORY: Family History  Problem Relation Age of Onset  . Heart failure Mother   . Diabetes Mother   .  Dementia Mother   . Other Father        "old age"    SOCIAL HISTORY: Social History   Socioeconomic History  . Marital status: Married    Spouse name: Not on file  . Number of children: 1  . Years of education: 58  . Highest education level: High school graduate  Occupational History  . Occupation: Retired  Engineer, production  . Financial resource strain: Not on file  . Food insecurity:    Worry: Not on file    Inability: Not on file  . Transportation needs:    Medical: Not on file    Non-medical: Not on file  Tobacco Use  . Smoking status: Never Smoker  . Smokeless tobacco: Never Used  Substance and Sexual Activity  . Alcohol use: No  . Drug use: No  . Sexual activity: Not Currently    Birth control/protection: Post-menopausal  Lifestyle  . Physical activity:    Days per week: Not on file    Minutes per session: Not on file  . Stress: Not on file  Relationships  . Social connections:    Talks on phone: Not on file    Gets together: Not on file    Attends religious service: Not on file    Active member of club or organization:  Not on file    Attends meetings of clubs or organizations: Not on file    Relationship status: Not on file  . Intimate partner violence:    Fear of current or ex partner: Not on file    Emotionally abused: Not on file    Physically abused: Not on file    Forced sexual activity: Not on file  Other Topics Concern  . Not on file  Social History Narrative   L[facility] at [facility].   Right-handed.   Cup coffee each morning.     PHYSICAL EXAM   Vitals:   04/29/18 1424  BP: 112/70  Pulse: 68    Not recorded      There is no height or weight on file to calculate BMI.  PHYSICAL EXAMNIATION:  Gen: NAD, conversant, well nourised, obese, well groomed                     Cardiovascular: Regular rate rhythm, no peripheral edema, warm, nontender. Eyes: Conjunctivae clear without exudates or hemorrhage Neck: Supple, no carotid bruits. Pulmonary: Clear to auscultation bilaterally   NEUROLOGICAL EXAM:  MMSE - Mini Mental State Exam 04/30/2018  Orientation to time 3  Orientation to Place 3  Registration 3  Attention/ Calculation 2  Recall 0  Language- name 2 objects 2  Language- repeat 1  Language- follow 3 step command 2  Language- read & follow direction 1  Write a sentence 1  Copy design 0  Total score 18     CRANIAL NERVES: CN II: Visual fields are full to confrontation.Pupils are round equal and briskly reactive to light. CN III, IV, VI: extraocular movement are normal. No ptosis. CN V: Facial sensation is intact to pinprick in all 3 divisions bilaterally. Corneal responses are intact.  CN VII: Face is symmetric with normal eye closure and smile. CN VIII: Hearing is normal to rubbing fingers CN IX, X: Palate elevates symmetrically. Phonation is normal. CN XI: Head turning and shoulder shrug are intact CN XII: Tongue is midline with normal movements and no atrophy.  MOTOR: She has mild bilateral upper extremity resting tremor, 4 limbs without noticeable  motor  weakness, but significant nuchal and limb  rigidity, bradykinesia, of both upper and lower extremities.  REFLEXES: Reflexes are active and symmetric at the biceps, triceps, knees, and ankles. Plantar responses are flexor.  SENSORY: Withdrawal to pain  COORDINATION: Rapid alternating movements and fine finger movements are intact. There is no dysmetria on finger-to-nose and heel-knee-shin.    GAIT/STANCE: Need assistance to get up from seated position, tendency to lean backwards, difficulty initiate gait   DIAGNOSTIC DATA (LABS, IMAGING, TESTING) - I reviewed patient records, labs, notes, testing and imaging myself where available.   ASSESSMENT AND PLAN  GIZELL DANSER is a 85 y.o. 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 Ph.D.  [facility] [address] [place], [place] [address] Ph: [phone] Fax: [phone]  CC: Angela Monday, MD

## 2018-05-01 LAB — COMPREHENSIVE METABOLIC PANEL
ALBUMIN: 4.3 g/dL (ref 3.6–4.6)
ALT: 6 IU/L (ref 0–32)
AST: 19 IU/L (ref 0–40)
Albumin/Globulin Ratio: 1.4 (ref 1.2–2.2)
Alkaline Phosphatase: 74 IU/L (ref 39–117)
BUN/Creatinine Ratio: 26 (ref 12–28)
BUN: 23 mg/dL (ref 8–27)
Bilirubin Total: 0.5 mg/dL (ref 0.0–1.2)
CO2: 26 mmol/L (ref 20–29)
Calcium: 9.2 mg/dL (ref 8.7–10.3)
Chloride: 101 mmol/L (ref 96–106)
Creatinine, Ser: 0.9 mg/dL (ref 0.57–1.00)
GFR calc Af Amer: 68 mL/min/{1.73_m2} (ref >59)
GFR calc non Af Amer: 59 mL/min/{1.73_m2} — ABNORMAL LOW (ref >59)
GLOBULIN, TOTAL: 3.1 g/dL (ref 1.5–4.5)
Glucose: 89 mg/dL (ref 65–99)
Potassium: 3.8 mmol/L (ref 3.5–5.2)
Sodium: 143 mmol/L (ref 134–144)
Total Protein: 7.4 g/dL (ref 6.0–8.5)

## 2018-05-01 LAB — CBC WITH DIFFERENTIAL
Basophils Absolute: 0 10*3/uL (ref 0.0–0.2)
Basos: 0 %
EOS (ABSOLUTE): 0.1 10*3/uL (ref 0.0–0.4)
EOS: 2 %
Hematocrit: 39.8 % (ref 34.0–46.6)
Hemoglobin: 13.1 g/dL (ref 11.1–15.9)
Immature Grans (Abs): 0 10*3/uL (ref 0.0–0.1)
Immature Granulocytes: 0 %
Lymphocytes Absolute: 2 10*3/uL (ref 0.7–3.1)
Lymphs: 29 %
MCH: 30 pg (ref 26.6–33.0)
MCHC: 32.9 g/dL (ref 31.5–35.7)
MCV: 91 fL (ref 79–97)
MONOS ABS: 0.4 10*3/uL (ref 0.1–0.9)
Monocytes: 6 %
NEUTROS ABS: 4.3 10*3/uL (ref 1.4–7.0)
Neutrophils: 63 %
RBC: 4.37 x10E6/uL (ref 3.77–5.28)
RDW: 12.8 % (ref 11.7–15.4)
WBC: 6.9 10*3/uL (ref 3.4–10.8)

## 2018-05-01 LAB — ANA W/REFLEX IF POSITIVE
Anti JO-1: <0.2 AI (ref 0.0–0.9)
Anti Nuclear Antibody(ANA): POSITIVE — AB
Centromere Ab Screen: <0.2 AI (ref 0.0–0.9)
Chromatin Ab SerPl-aCnc: <0.2 AI (ref 0.0–0.9)
ENA RNP Ab: 1.1 AI — ABNORMAL HIGH (ref 0.0–0.9)
ENA SM Ab Ser-aCnc: <0.2 AI (ref 0.0–0.9)
ENA SSA (RO) Ab: <0.2 AI (ref 0.0–0.9)
ENA SSB (LA) Ab: <0.2 AI (ref 0.0–0.9)
Scleroderma (Scl-70) (ENA) Antibody, IgG: <0.2 AI (ref 0.0–0.9)
dsDNA Ab: <1 IU/mL (ref 0–9)

## 2018-05-01 LAB — VITAMIN B12: Vitamin B-12: 290 pg/mL (ref 232–1245)

## 2018-05-01 LAB — C-REACTIVE PROTEIN: CRP: 2 mg/L (ref 0–10)

## 2018-05-01 LAB — SEDIMENTATION RATE: Sed Rate: 49 mm/hr — ABNORMAL HIGH (ref 0–40)

## 2018-05-01 LAB — VITAMIN D 25 HYDROXY (VIT D DEFICIENCY, FRACTURES): Vit D, 25-Hydroxy: 27.5 ng/mL — ABNORMAL LOW (ref 30.0–100.0)

## 2018-05-01 LAB — THYROID PEROXIDASE ANTIBODY: Thyroperoxidase Ab SerPl-aCnc: 122 IU/mL — ABNORMAL HIGH (ref 0–34)

## 2018-05-01 LAB — TSH: TSH: 2.88 u[IU]/mL (ref 0.450–4.500)

## 2018-05-03 ENCOUNTER — Telehealth: Payer: Self-pay | Admitting: Neurology

## 2018-05-03 DIAGNOSIS — R413 Other amnesia: Secondary | ICD-10-CM

## 2018-05-03 NOTE — Telephone Encounter (Signed)
I was able to speak with the patient's daughter.  She is aware of her mother's lab results and Dr. Zannie Cove recommendations for further testing.  She verbalized understanding of the importance.  She is going to discuss this information with her father (pt's husband) and call us back to let us know how they would like to proceed.

## 2018-05-03 NOTE — Telephone Encounter (Signed)
I called the patient's daughter, Molly Maduro (on [phone]) and left message requesting a call back.

## 2018-05-03 NOTE — Telephone Encounter (Signed)
Please call patient, laboratory evaluation showed positive thyroid peroxidase, elevated ESR, positive ANA,  Above findings indicating a possible inflammatory process,  If she wish and family agrees, I would suggest potential repeating evaluation with MRI of the brain, even lumbar puncture to rule out inflammatory process.  I can talk with her daughter for further discussion

## 2018-05-04 NOTE — Telephone Encounter (Addendum)
Patient's daughter returned call.  She and her father have discussed the patient having MRI brain and LP.  They would like her to proceed with these tests.

## 2018-05-05 ENCOUNTER — Telehealth: Payer: Self-pay | Admitting: Neurology

## 2018-05-05 NOTE — Telephone Encounter (Signed)
Noted I sent the order to GI and put down for them to contact the patient daughter [name].

## 2018-05-05 NOTE — Telephone Encounter (Signed)
Spoke to patient's daughter, Molly Maduro (on [phone]).  She is aware that Dr. Terrace Arabia has ordered the MRI brain first then will decide on LP.  She is agreeable to this plan.

## 2018-05-05 NOTE — Telephone Encounter (Signed)
[employer] medicare order sent to [facility]. No auth they will reach out to the pt to schedule.  °

## 2018-05-05 NOTE — Telephone Encounter (Signed)
We will proceed with MRI brain first, then depend on the result, will order LP.

## 2018-05-05 NOTE — Addendum Note (Signed)
Addended by: Levert Feinstein on: 05/05/2018 12:07 PM   Modules accepted: Orders

## 2018-05-06 NOTE — Telephone Encounter (Signed)
Spoke to patient's daughter, [name] (on [place]).  States the patient will require oral sedation prior to her MRI scan. Per vo by Dr. Terrace Arabia, provide rx for alprazolam 0.5mg , Take 1-2 tablets thirty minutes prior to MRI.  May take one additional tablet before entering scanner, if needed.  The patient does not drive.  Her daughter will pick up the prescription and take it to her.  She would like it sent to [facility] in [place].  She also needs notification of the MRI and the alprazolam faxed to [facility] in [place] as [place].  The information has been faxed and confirmed to [phone].

## 2018-05-06 NOTE — Telephone Encounter (Signed)
Patients daughter is calling stating she needs some orders faxed to the nursing home for some pre test meds. [phone] [name])

## 2018-05-06 NOTE — Addendum Note (Signed)
Addended by: Lilla Shook on: 05/06/2018 03:42 PM   Modules accepted: Orders

## 2018-05-07 ENCOUNTER — Encounter: Payer: Self-pay | Admitting: Neurology

## 2018-05-07 MED ORDER — ALPRAZOLAM 0.5 MG PO TABS: ORAL_TABLET | ORAL | 0 refills | Status: AC

## 2018-05-07 NOTE — Addendum Note (Signed)
Addended by: Levert Feinstein on: 05/07/2018 08:58 AM   Modules accepted: Orders

## 2018-05-15 ENCOUNTER — Other Ambulatory Visit: Payer: Medicare Other

## 2018-07-02 DEATH — deceased
